# Patient Record
Sex: Female | Born: 1955
Health system: Southern US, Community
[De-identification: ages and names within clinical notes are randomized; demographics above are authoritative.]

## PROBLEM LIST (undated history)

## (undated) DIAGNOSIS — F32A Depression, unspecified: Secondary | ICD-10-CM

## (undated) DIAGNOSIS — I10 Essential (primary) hypertension: Secondary | ICD-10-CM

## (undated) DIAGNOSIS — T7840XA Allergy, unspecified, initial encounter: Secondary | ICD-10-CM

## (undated) DIAGNOSIS — H269 Unspecified cataract: Secondary | ICD-10-CM

## (undated) DIAGNOSIS — E785 Hyperlipidemia, unspecified: Secondary | ICD-10-CM

## (undated) DIAGNOSIS — I209 Angina pectoris, unspecified: Secondary | ICD-10-CM

## (undated) DIAGNOSIS — K219 Gastro-esophageal reflux disease without esophagitis: Secondary | ICD-10-CM

## (undated) DIAGNOSIS — J449 Chronic obstructive pulmonary disease, unspecified: Secondary | ICD-10-CM

## (undated) DIAGNOSIS — M1990 Unspecified osteoarthritis, unspecified site: Secondary | ICD-10-CM

## (undated) DIAGNOSIS — N950 Postmenopausal bleeding: Secondary | ICD-10-CM

## (undated) DIAGNOSIS — F419 Anxiety disorder, unspecified: Secondary | ICD-10-CM

## (undated) HISTORY — DX: Angina pectoris, unspecified: I20.9

## (undated) HISTORY — DX: Anxiety disorder, unspecified: F41.9

## (undated) HISTORY — DX: Hyperlipidemia, unspecified: E78.5

## (undated) HISTORY — DX: Essential (primary) hypertension: I10

## (undated) HISTORY — DX: Gastro-esophageal reflux disease without esophagitis: K21.9

## (undated) HISTORY — DX: Depression, unspecified: F32.A

## (undated) HISTORY — DX: Allergy, unspecified, initial encounter: T78.40XA

## (undated) HISTORY — DX: Postmenopausal bleeding: N95.0

## (undated) HISTORY — DX: Unspecified cataract: H26.9

## (undated) HISTORY — PX: WISDOM TOOTH EXTRACTION: SHX21

## (undated) HISTORY — PX: TUBAL LIGATION: SHX77

---

## 2006-06-05 ENCOUNTER — Emergency Department: Payer: Self-pay | Admitting: General Practice

## 2007-09-20 ENCOUNTER — Ambulatory Visit: Payer: Self-pay | Admitting: Obstetrics and Gynecology

## 2007-10-31 ENCOUNTER — Ambulatory Visit: Payer: Self-pay | Admitting: Obstetrics and Gynecology

## 2009-09-17 ENCOUNTER — Ambulatory Visit: Payer: Self-pay | Admitting: Obstetrics and Gynecology

## 2010-02-28 ENCOUNTER — Emergency Department: Payer: Self-pay | Admitting: Emergency Medicine

## 2011-08-03 ENCOUNTER — Ambulatory Visit: Payer: Self-pay | Admitting: Obstetrics and Gynecology

## 2011-08-05 ENCOUNTER — Ambulatory Visit: Payer: Self-pay | Admitting: Obstetrics and Gynecology

## 2012-02-07 ENCOUNTER — Ambulatory Visit: Payer: Self-pay | Admitting: Obstetrics and Gynecology

## 2012-02-22 ENCOUNTER — Ambulatory Visit: Payer: Self-pay | Admitting: Gastroenterology

## 2012-02-25 LAB — PATHOLOGY REPORT

## 2012-03-07 ENCOUNTER — Ambulatory Visit: Payer: Self-pay | Admitting: Gastroenterology

## 2012-05-07 IMAGING — MG MM MAMMO DIAGNOSTIC UNILATERAL*R*
1 series · 5 of 5 positions shown · non-contrast
Comparison: none

REASON FOR EXAM: FU RT NODULE
COMMENTS:

[R CC · right · 5 of 5 slices shown]
[im 1/5]
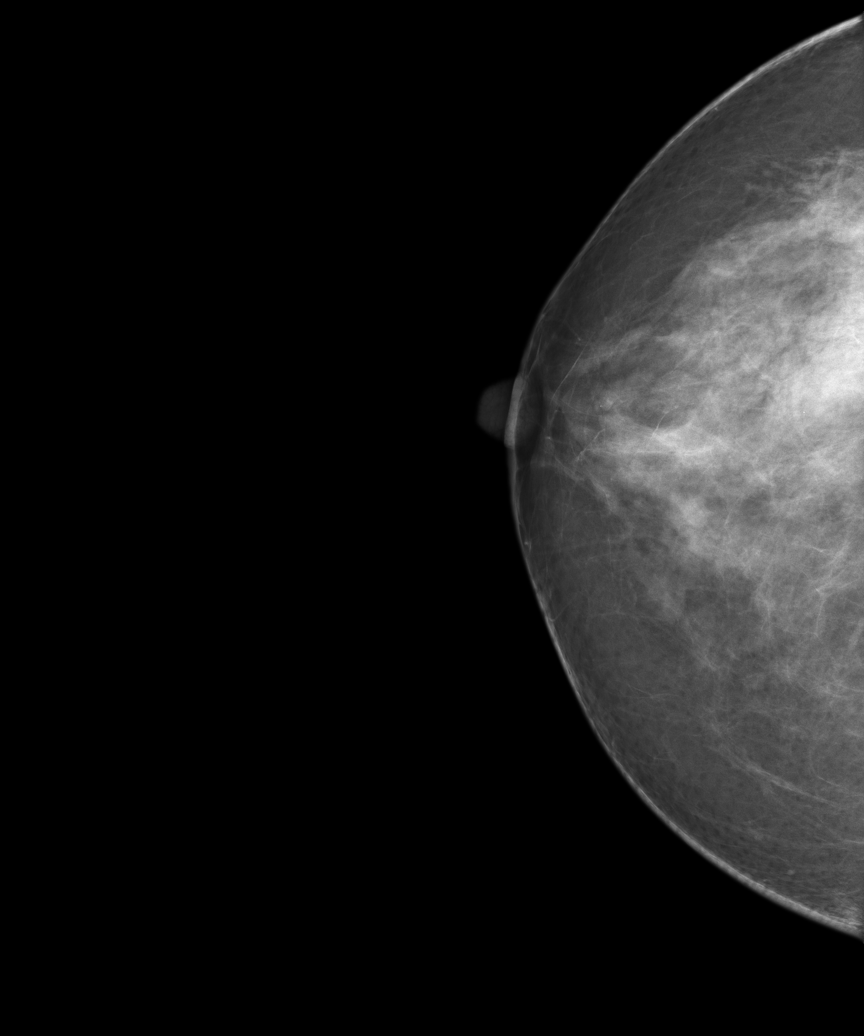
[im 2/5]
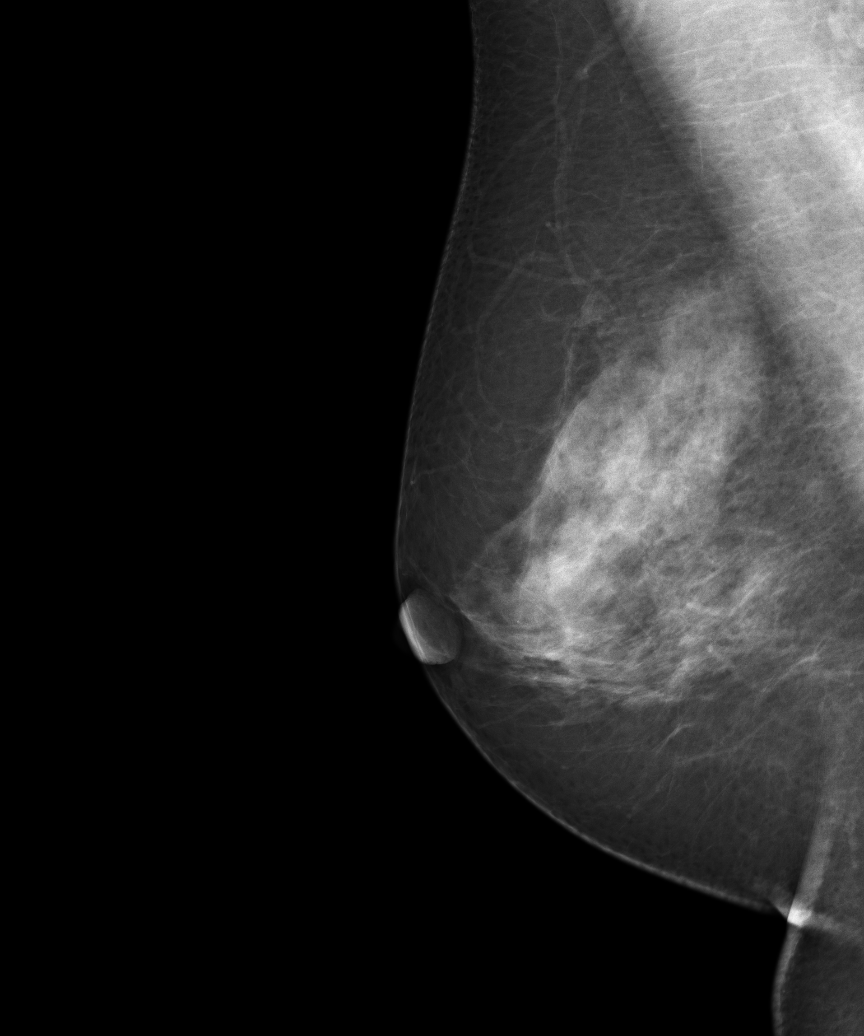
[im 3/5]
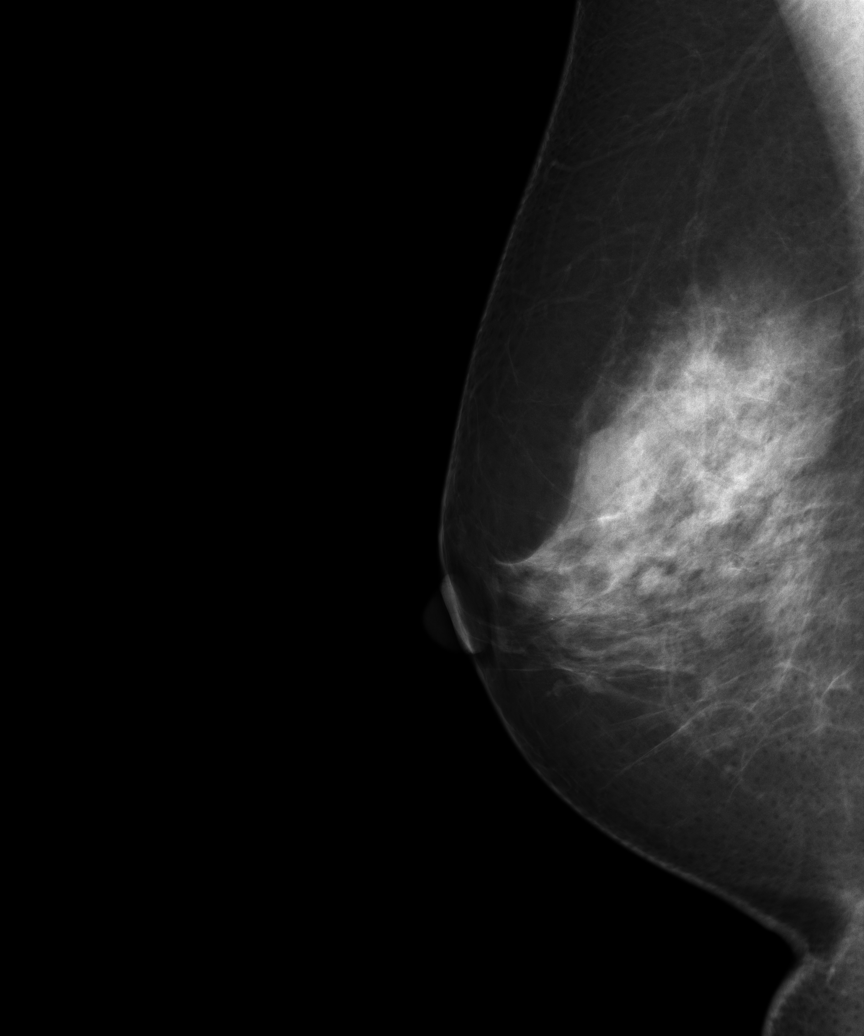
[im 4/5]
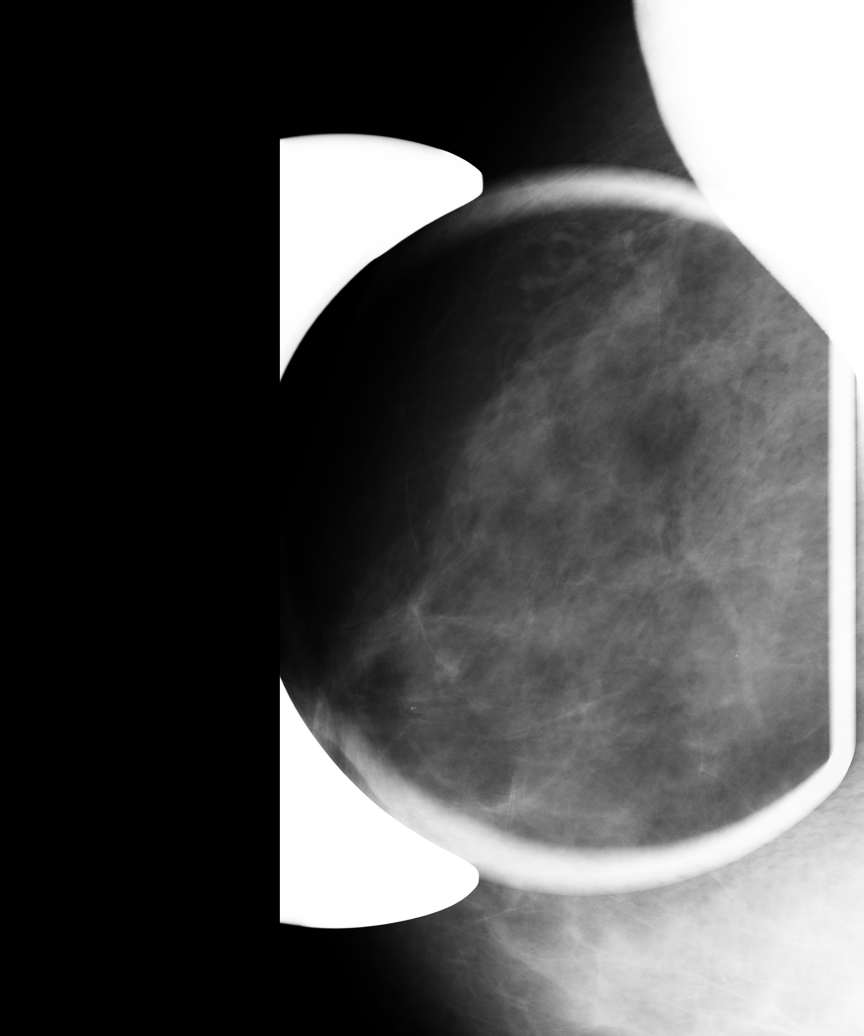
[im 5/5]
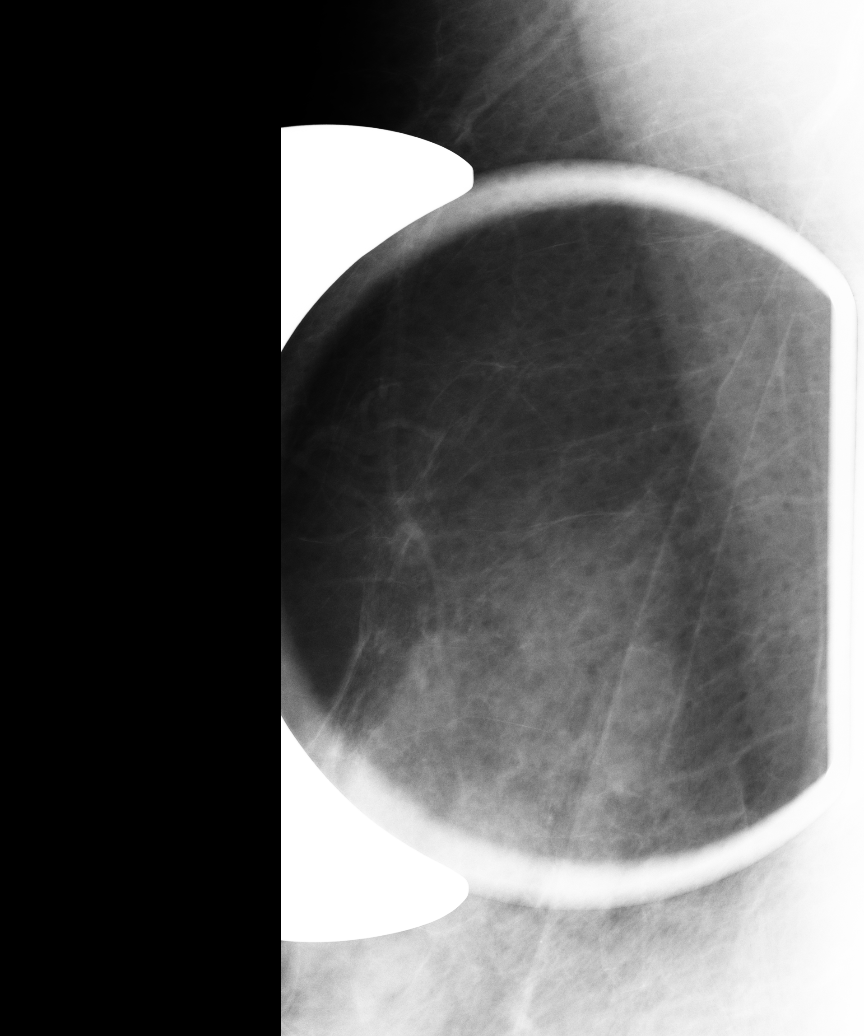

[5 of 5 positions shown; findings below may reference images not displayed]

PROCEDURE:     MAM - MAM DGTL UNI MAM RT BREAST W/CAD  - February 07, 2012 [DATE]

RESULT:     The patient underwent six-month followup mammography of the
right breast to reevaluate an area of nodularity in the upper outer
quadrant. Reference is made to previous study 05 August, 2011 as well as
to a bilateral screening study 03 August, 2011 and 31 October, 2007.

The right breast exhibits a moderately dense parenchymal pattern. I see no
dominant mass. There is parenchymal density superiorly. Allowing for
differences in positioning the findings are even less conspicuous today than
seen earlier. A supplemental spot magnification series as well as true
lateral films do not reveal suspicious findings.
IMPRESSION: I do not see findings suspicious for developing malignancy.

BI-RADS 2  benign findings

Recommendation: Please return to yearly mammographic followup of both
breasts i.e. in [REDACTED] or August 2012.

A negative mammographic or ultrasonic report should not preclude biopsy of
clinically palpable or otherwise suspicious lesions.

## 2012-08-23 ENCOUNTER — Ambulatory Visit: Payer: Self-pay | Admitting: Obstetrics and Gynecology

## 2013-10-01 ENCOUNTER — Ambulatory Visit: Payer: Self-pay | Admitting: Obstetrics and Gynecology

## 2014-08-05 DIAGNOSIS — F419 Anxiety disorder, unspecified: Secondary | ICD-10-CM | POA: Insufficient documentation

## 2014-08-05 DIAGNOSIS — E782 Mixed hyperlipidemia: Secondary | ICD-10-CM | POA: Insufficient documentation

## 2014-08-05 DIAGNOSIS — E785 Hyperlipidemia, unspecified: Secondary | ICD-10-CM | POA: Insufficient documentation

## 2014-08-05 DIAGNOSIS — I10 Essential (primary) hypertension: Secondary | ICD-10-CM | POA: Insufficient documentation

## 2014-12-10 ENCOUNTER — Ambulatory Visit: Payer: Self-pay | Admitting: Obstetrics and Gynecology

## 2014-12-10 LAB — HM DEXA SCAN

## 2016-02-05 ENCOUNTER — Other Ambulatory Visit: Payer: Self-pay | Admitting: Obstetrics and Gynecology

## 2016-02-05 DIAGNOSIS — Z1231 Encounter for screening mammogram for malignant neoplasm of breast: Secondary | ICD-10-CM

## 2016-02-17 ENCOUNTER — Ambulatory Visit: Payer: Self-pay

## 2017-06-24 ENCOUNTER — Ambulatory Visit: Admission: RE | Admit: 2017-06-24 | Payer: Self-pay | Source: Ambulatory Visit | Admitting: Gastroenterology

## 2017-06-24 ENCOUNTER — Encounter: Admission: RE | Payer: Self-pay | Source: Ambulatory Visit

## 2017-06-24 SURGERY — COLONOSCOPY WITH PROPOFOL
Anesthesia: General

## 2017-11-11 ENCOUNTER — Ambulatory Visit: Payer: BLUE CROSS/BLUE SHIELD | Admitting: Unknown Physician Specialty

## 2017-11-11 ENCOUNTER — Encounter: Payer: Self-pay | Admitting: Unknown Physician Specialty

## 2017-11-11 VITALS — BP 113/61 | HR 65 | Temp 98.5°F | Ht 63.3 in | Wt 145.4 lb

## 2017-11-11 DIAGNOSIS — B029 Zoster without complications: Secondary | ICD-10-CM | POA: Diagnosis not present

## 2017-11-11 DIAGNOSIS — E7800 Pure hypercholesterolemia, unspecified: Secondary | ICD-10-CM | POA: Diagnosis not present

## 2017-11-11 DIAGNOSIS — N950 Postmenopausal bleeding: Secondary | ICD-10-CM

## 2017-11-11 DIAGNOSIS — R5383 Other fatigue: Secondary | ICD-10-CM

## 2017-11-11 DIAGNOSIS — F419 Anxiety disorder, unspecified: Secondary | ICD-10-CM | POA: Diagnosis not present

## 2017-11-11 DIAGNOSIS — Z7689 Persons encountering health services in other specified circumstances: Secondary | ICD-10-CM

## 2017-11-11 DIAGNOSIS — I10 Essential (primary) hypertension: Secondary | ICD-10-CM | POA: Diagnosis not present

## 2017-11-11 NOTE — Progress Notes (Addendum)
BP 113/61   Pulse 65   Temp 98.5 F (36.9 C) (Oral)   Ht 5' 3.3" (1.608 m)   Wt 145 lb 6.4 oz (66 kg)   LMP  (LMP Unknown)   SpO2 93%   BMI 25.51 kg/m    Subjective:    Patient ID: Valerie Donovan, female    DOB: 1956/04/01, 61 y.o.   MRN: [id number]  HPI: Valerie Donovan is a 61 y.o. female  Chief Complaint  Patient presents with  . Establish Care   Pt is a new patient.  She has been receiving regular medical care for the following problems  Hypertension Using medications without difficulty Average home BPs "running normal"  No problems or lightheadedness No chest pain with exertion or shortness of breath No Edema   Hyperlipidemia Using medications without problems: No Muscle aches  Diet compliance:Exercise: Very active.  Works part-time as a Arts development officer Prozac and Buspar  Depression screen [facility] 2/9 11/11/2017  Decreased Interest 3  Down, Depressed, Hopeless 0  PHQ - 2 Score 3  Altered sleeping 0  Tired, decreased energy 3  Change in appetite 0  Feeling bad or failure about yourself  0  Trouble concentrating 0  Moving slowly or fidgety/restless 0  Suicidal thoughts 0  PHQ-9 Score 6   Vaginal bleeding Pt had an episode of vaginal bleeding 1 week ago.  Last colonoscopy was 5 years ago  Shingles Broke out 2 weeks ago left flank.  This seems to be resolving.  She has not been vaccinated and she is nervous about taking it.  She is complaing of pain left flank and clothes touching is painful  Fatigue X 2 weeks has "felt like hell."  Admits to being under a lot of stress  Labs done on 02/25/2017 reviewed and were normal  Relevant past medical, surgical, family and social history reviewed and updated as indicated. Interim medical history since our last visit reviewed. Allergies and medications reviewed and updated.  Review of Systems  Per HPI unless specifically indicated above     Objective:    BP 113/61   Pulse 65   Temp 98.5 F  (36.9 C) (Oral)   Ht 5' 3.3" (1.608 m)   Wt 145 lb 6.4 oz (66 kg)   LMP  (LMP Unknown)   SpO2 93%   BMI 25.51 kg/m   Wt Readings from Last 3 Encounters:  11/11/17 145 lb 6.4 oz (66 kg)    Physical Exam  Constitutional: She is oriented to person, place, and time. She appears well-developed and well-nourished. No distress.  HENT:  Head: Normocephalic and atraumatic.  Eyes: Conjunctivae and lids are normal. Right eye exhibits no discharge. Left eye exhibits no discharge. No scleral icterus.  Neck: Normal range of motion. Neck supple. No JVD present. Carotid bruit is not present.  Cardiovascular: Normal rate, regular rhythm and normal heart sounds.  Pulmonary/Chest: Effort normal and breath sounds normal.  Abdominal: Normal appearance. There is no splenomegaly or hepatomegaly.  Musculoskeletal: Normal range of motion.  Neurological: She is alert and oriented to person, place, and time.  Skin: Skin is warm, dry and intact. No rash noted. No pallor.  Left flank healing vesicles in dermatomal distribution  Psychiatric: She has a normal mood and affect. Her behavior is normal. Judgment and thought content normal.    No results found for this or any previous visit.    Assessment & Plan:   Problem List Items Addressed This Visit  Unprioritized   Anxiety    Stable, continue present medications.        Relevant Medications   busPIRone (BUSPAR) 15 MG tablet   FLUoxetine (PROZAC) 20 MG capsule   Hyperlipidemia, unspecified    Stable, continue present medications.        Relevant Medications   atorvastatin (LIPITOR) 20 MG tablet   lisinopril (PRINIVIL,ZESTRIL) 10 MG tablet   Other Relevant Orders   Lipid Panel w/o Chol/HDL Ratio   Hypertension    Stable, continue present medications.        Relevant Medications   atorvastatin (LIPITOR) 20 MG tablet   lisinopril (PRINIVIL,ZESTRIL) 10 MG tablet   Other Relevant Orders   Comprehensive metabolic panel    Other Visit  Diagnoses    Herpes zoster without complication    -  Primary   This seems to be resolving on it's own.  Discussed Lidoderm OTC patches   Fatigue, unspecified type       This is new.  Check labs   Relevant Orders   CBC with Differential/Platelet   Comprehensive metabolic panel   TSH   VITAMIN D 25 Hydroxy (Vit-D Deficiency, Fractures)   Vitamin B12   Post-menopausal bleeding       Appt already made with gyn.  Will do labs to help start the diagnostic proceed   Relevant Orders   [place]   Estrogens, Total   LH   Encounter to establish care       Relevant Orders   HIV antibody   Hepatitis C antibody   MM DIGITAL SCREENING BILATERAL       Follow up plan: Return in about 2 weeks (around 11/25/2017).

## 2017-11-11 NOTE — Assessment & Plan Note (Signed)
Stable, continue present medications.   

## 2017-11-11 NOTE — Patient Instructions (Signed)
Please do call to schedule your mammogram; the number to schedule one at either [facility] or [facility] is [phone]   

## 2017-11-13 LAB — COMPREHENSIVE METABOLIC PANEL
A/G RATIO: 1.9 (ref 1.2–2.2)
ALT: 15 IU/L (ref 0–32)
AST: 14 IU/L (ref 0–40)
Albumin: 4.6 g/dL (ref 3.6–4.8)
Alkaline Phosphatase: 114 IU/L (ref 39–117)
BILIRUBIN TOTAL: 0.2 mg/dL (ref 0.0–1.2)
BUN/Creatinine Ratio: 13 (ref 12–28)
BUN: 10 mg/dL (ref 8–27)
CHLORIDE: 104 mmol/L (ref 96–106)
CO2: 21 mmol/L (ref 20–29)
Calcium: 9.6 mg/dL (ref 8.7–10.3)
Creatinine, Ser: 0.8 mg/dL (ref 0.57–1.00)
GFR calc non Af Amer: 79 mL/min/{1.73_m2} (ref >59)
GFR, EST AFRICAN AMERICAN: 91 mL/min/{1.73_m2} (ref >59)
Globulin, Total: 2.4 g/dL (ref 1.5–4.5)
Glucose: 93 mg/dL (ref 65–99)
POTASSIUM: 4.3 mmol/L (ref 3.5–5.2)
Sodium: 142 mmol/L (ref 134–144)
TOTAL PROTEIN: 7 g/dL (ref 6.0–8.5)

## 2017-11-13 LAB — FOLLICLE STIMULATING HORMONE: FSH: 97.8 m[IU]/mL

## 2017-11-13 LAB — CBC WITH DIFFERENTIAL/PLATELET
BASOS: 0 %
Basophils Absolute: 0 10*3/uL (ref 0.0–0.2)
EOS (ABSOLUTE): 0.1 10*3/uL (ref 0.0–0.4)
Eos: 1 %
Hematocrit: 41.1 % (ref 34.0–46.6)
Hemoglobin: 13.9 g/dL (ref 11.1–15.9)
Immature Grans (Abs): 0 10*3/uL (ref 0.0–0.1)
Immature Granulocytes: 0 %
LYMPHS ABS: 2.5 10*3/uL (ref 0.7–3.1)
Lymphs: 26 %
MCH: 31.4 pg (ref 26.6–33.0)
MCHC: 33.8 g/dL (ref 31.5–35.7)
MCV: 93 fL (ref 79–97)
MONOS ABS: 0.6 10*3/uL (ref 0.1–0.9)
Monocytes: 6 %
NEUTROS ABS: 6.5 10*3/uL (ref 1.4–7.0)
Neutrophils: 67 %
PLATELETS: 331 10*3/uL (ref 150–379)
RBC: 4.42 x10E6/uL (ref 3.77–5.28)
RDW: 13.1 % (ref 12.3–15.4)
WBC: 9.8 10*3/uL (ref 3.4–10.8)

## 2017-11-13 LAB — LIPID PANEL W/O CHOL/HDL RATIO
Cholesterol, Total: 138 mg/dL (ref 100–199)
HDL: 41 mg/dL (ref >39)
LDL Calculated: 77 mg/dL (ref 0–99)
Triglycerides: 102 mg/dL (ref 0–149)
VLDL Cholesterol Cal: 20 mg/dL (ref 5–40)

## 2017-11-13 LAB — ESTROGENS, TOTAL: ESTROGEN: 92 pg/mL

## 2017-11-13 LAB — HEPATITIS C ANTIBODY: Hep C Virus Ab: <0.1 s/co ratio (ref 0.0–0.9)

## 2017-11-13 LAB — VITAMIN D 25 HYDROXY (VIT D DEFICIENCY, FRACTURES): VIT D 25 HYDROXY: 18.7 ng/mL — AB (ref 30.0–100.0)

## 2017-11-13 LAB — VITAMIN B12: VITAMIN B 12: 229 pg/mL — AB (ref 232–1245)

## 2017-11-13 LAB — HIV ANTIBODY (ROUTINE TESTING W REFLEX): HIV Screen 4th Generation wRfx: NONREACTIVE

## 2017-11-13 LAB — LUTEINIZING HORMONE: LH: 50.6 m[IU]/mL

## 2017-11-13 LAB — TSH: TSH: 2.67 u[IU]/mL (ref 0.450–4.500)

## 2017-11-30 ENCOUNTER — Ambulatory Visit: Payer: BLUE CROSS/BLUE SHIELD | Admitting: Unknown Physician Specialty

## 2017-12-19 ENCOUNTER — Ambulatory Visit: Payer: BLUE CROSS/BLUE SHIELD | Admitting: Unknown Physician Specialty

## 2017-12-21 ENCOUNTER — Encounter: Payer: Self-pay | Admitting: Unknown Physician Specialty

## 2017-12-21 ENCOUNTER — Ambulatory Visit (INDEPENDENT_AMBULATORY_CARE_PROVIDER_SITE_OTHER): Payer: BLUE CROSS/BLUE SHIELD | Admitting: Unknown Physician Specialty

## 2017-12-21 VITALS — BP 132/74 | HR 55 | Temp 98.2°F | Wt 145.2 lb

## 2017-12-21 DIAGNOSIS — R5383 Other fatigue: Secondary | ICD-10-CM

## 2017-12-21 DIAGNOSIS — E559 Vitamin D deficiency, unspecified: Secondary | ICD-10-CM

## 2017-12-21 DIAGNOSIS — E538 Deficiency of other specified B group vitamins: Secondary | ICD-10-CM | POA: Diagnosis not present

## 2017-12-21 NOTE — Assessment & Plan Note (Signed)
Taking Vitamin D supplement 

## 2017-12-21 NOTE — Progress Notes (Signed)
BP 132/74   Pulse (!) 55   Temp 98.2 F (36.8 C) (Oral)   Wt 145 lb 3.2 oz (65.9 kg)   SpO2 96%   BMI 25.48 kg/m    Subjective:    Patient ID: Valerie Donovan, female    DOB: 1956-08-13, 62 y.o.   MRN: [id number]  HPI: Valerie Donovan is a 62 y.o. female  Chief Complaint  Patient presents with  . Follow-up   Post menopausal bleeding Pt with history of post-menopausal bleeding.  She is seeing Dr. Leafy Ro at [facility]. She is due to have a biopsy and saline infused [place] later this month.    Fatigue She is taking Vitamin B12 and Vit D after her last lab work.  States she is doing better with her fatigue.    Relevant past medical, surgical, family and social history reviewed and updated as indicated. Interim medical history since our last visit reviewed. Allergies and medications reviewed and updated.  Review of Systems  Per HPI unless specifically indicated above     Objective:    BP 132/74   Pulse (!) 55   Temp 98.2 F (36.8 C) (Oral)   Wt 145 lb 3.2 oz (65.9 kg)   SpO2 96%   BMI 25.48 kg/m   Wt Readings from Last 3 Encounters:  12/21/17 145 lb 3.2 oz (65.9 kg)  11/11/17 145 lb 6.4 oz (66 kg)    Physical Exam  Constitutional: She is oriented to person, place, and time. She appears well-developed and well-nourished. No distress.  HENT:  Head: Normocephalic and atraumatic.  Eyes: Conjunctivae and lids are normal. Right eye exhibits no discharge. Left eye exhibits no discharge. No scleral icterus.  Neck: Normal range of motion. Neck supple. No JVD present. Carotid bruit is not present.  Cardiovascular: Normal rate, regular rhythm and normal heart sounds.  Pulmonary/Chest: Effort normal and breath sounds normal.  Abdominal: Normal appearance. There is no splenomegaly or hepatomegaly.  Musculoskeletal: Normal range of motion.  Neurological: She is alert and oriented to person, place, and time.  Skin: Skin is warm, dry and intact. No rash noted. No pallor.   Psychiatric: She has a normal mood and affect. Her behavior is normal. Judgment and thought content normal.    Results for orders placed or performed in visit on 11/11/17  CBC with Differential/Platelet  Result Value Ref Range   WBC 9.8 3.4 - 10.8 x10E3/uL   RBC 4.42 3.77 - 5.28 x10E6/uL   Hemoglobin 13.9 11.1 - 15.9 g/dL   Hematocrit 41.1 34.0 - 46.6 %   MCV 93 79 - 97 fL   MCH 31.4 26.6 - 33.0 pg   MCHC 33.8 31.5 - 35.7 g/dL   RDW 13.1 12.3 - 15.4 %   Platelets 331 150 - 379 x10E3/uL   Neutrophils 67 Not Estab. %   Lymphs 26 Not Estab. %   Monocytes 6 Not Estab. %   Eos 1 Not Estab. %   Basos 0 Not Estab. %   Neutrophils Absolute 6.5 1.4 - 7.0 x10E3/uL   Lymphocytes Absolute 2.5 0.7 - 3.1 x10E3/uL   Monocytes Absolute 0.6 0.1 - 0.9 x10E3/uL   EOS (ABSOLUTE) 0.1 0.0 - 0.4 x10E3/uL   Basophils Absolute 0.0 0.0 - 0.2 x10E3/uL   Immature Granulocytes 0 Not Estab. %   Immature Grans (Abs) 0.0 0.0 - 0.1 x10E3/uL  Comprehensive metabolic panel  Result Value Ref Range   Glucose 93 65 - 99 mg/dL   BUN 10  8 - 27 mg/dL   Creatinine, Ser 0.80 0.57 - 1.00 mg/dL   GFR calc non Af Amer 79 >59 mL/min/1.73   GFR calc Af Amer 91 >59 mL/min/1.73   BUN/Creatinine Ratio 13 12 - 28   Sodium 142 134 - 144 mmol/L   Potassium 4.3 3.5 - 5.2 mmol/L   Chloride 104 96 - 106 mmol/L   CO2 21 20 - 29 mmol/L   Calcium 9.6 8.7 - 10.3 mg/dL   Total Protein 7.0 6.0 - 8.5 g/dL   Albumin 4.6 3.6 - 4.8 g/dL   Globulin, Total 2.4 1.5 - 4.5 g/dL   Albumin/Globulin Ratio 1.9 1.2 - 2.2   Bilirubin Total 0.2 0.0 - 1.2 mg/dL   Alkaline Phosphatase 114 39 - 117 IU/L   AST 14 0 - 40 IU/L   ALT 15 0 - 32 IU/L  Lipid Panel w/o Chol/HDL Ratio  Result Value Ref Range   Cholesterol, Total 138 100 - 199 mg/dL   Triglycerides 102 0 - 149 mg/dL   HDL 41 >39 mg/dL   VLDL Cholesterol Cal 20 5 - 40 mg/dL   LDL Calculated 77 0 - 99 mg/dL  TSH  Result Value Ref Range   TSH 2.670 0.450 - 4.500 uIU/mL  FSH  Result  Value Ref Range   FSH 97.8 mIU/mL  Estrogens, Total  Result Value Ref Range   Estrogen 92 pg/mL  LH  Result Value Ref Range   LH 50.6 mIU/mL  VITAMIN D 25 Hydroxy (Vit-D Deficiency, Fractures)  Result Value Ref Range   Vit D, 25-Hydroxy 18.7 (L) 30.0 - 100.0 ng/mL  Vitamin B12  Result Value Ref Range   Vitamin B-12 229 (L) 232 - 1,245 pg/mL  HIV antibody  Result Value Ref Range   HIV Screen 4th Generation wRfx Non Reactive Non Reactive  Hepatitis C antibody  Result Value Ref Range   Hep C Virus Ab <0.1 0.0 - 0.9 s/co ratio      Assessment & Plan:   Problem List Items Addressed This Visit      Unprioritized   Vitamin B12 deficiency    Doing well with oral supplements      Vitamin D deficiency    Taking Vitamin D supplement       Other Visit Diagnoses    Fatigue, unspecified type    -  Primary   Improved with Vitamin B12 and Vitamin D deficiency       Follow up plan: Return in about 6 months (around 06/20/2018).

## 2017-12-21 NOTE — Assessment & Plan Note (Signed)
Doing well with oral supplements

## 2017-12-21 NOTE — Patient Instructions (Signed)
Please do call to schedule your mammogram; the number to schedule one at either [facility] or [facility] is [phone]   

## 2018-01-25 ENCOUNTER — Ambulatory Visit
Admission: RE | Admit: 2018-01-25 | Discharge: 2018-01-25 | Disposition: A | Discharge status: Home / Self Care | Payer: BLUE CROSS/BLUE SHIELD | Source: Ambulatory Visit | Attending: Unknown Physician Specialty | Admitting: Unknown Physician Specialty

## 2018-01-25 ENCOUNTER — Encounter: Payer: Self-pay | Admitting: Radiology

## 2018-01-25 DIAGNOSIS — Z7689 Persons encountering health services in other specified circumstances: Secondary | ICD-10-CM

## 2018-01-25 DIAGNOSIS — Z1231 Encounter for screening mammogram for malignant neoplasm of breast: Secondary | ICD-10-CM | POA: Insufficient documentation

## 2018-03-07 ENCOUNTER — Ambulatory Visit: Payer: Self-pay | Admitting: *Deleted

## 2018-03-07 NOTE — Telephone Encounter (Signed)
Routing to provider who the patient is scheduled to see tomorrow, just an FYI as to why patient is coming in.

## 2018-03-07 NOTE — Telephone Encounter (Signed)
Patient states when she went to the bathroom this morning she went to scratch her ear and noticed that it was crusty & looked like coffee grinds in her ear. She said she put a qtip in there to clean it and noticed it was bleeding just a small amount. I spoke with [facility] to see what they thought because she wanted to know if she needed to be seen & leslie said that would be a good idea. I called the office because they are full for today & they told me to schedule something for her tomorrow. I scheduled her an appointment with Apolonio Schneiders at 8:15 tomorrow morning. She wants to know what kind of home care she can do to treat it until she can be seen tomorrow. She states she is fine & no pain. She said if it started to pour blood she would call us back.      Call to patient- noticed crusty dried blood in L ear this morning- when she cleaned the outer ear area- she noticed bright red blood- she use about 4 Q-tips on the outer ear area- not sticking them all the way into the ear canal. Ear is not sore and she has not had any injury at all. Sunday she was eating bit-of-honey which made her jaw sore on the R. She did not have any blood on her pillow- she does sleep with her 2 dogs. Patient has appointment in the morning and will call if she has any changes.  Reason for Disposition . Unexplained bleeding from ear  Answer Assessment - Initial Assessment Questions 1. LOCATION: "Which ear is involved?"      Left ear 2. COLOR: "What is the color of the discharge?"      Dark red- to bright red- no drainage out of ear 3. CONSISTENCY: "How runny is the discharge? Could it be water?"      Dry at first- crusty- then wet and red at outer ear 4. ONSET: "When did you first notice the discharge?"     This morning 5. PAIN: "Is there any earache?" "How bad is it?"  (Scale 1-10; or mild, moderate, severe)     No pain 6. OBJECTS: "Any use of q-tips or have you inserted anything else in your ear?"     Nothing in inner  ear- patient does sleep with dogs 7. OTHER SYMPTOMS: "Do you have any other symptoms?" (e.g., headache, fever, dizziness, vomiting, runny nose)     no 8. PREGNANCY: "Is there any chance you are pregnant?" "When was your last menstrual period?"     n/a  Protocols used: EAR - DISCHARGE-A-AH

## 2018-03-08 ENCOUNTER — Encounter: Payer: Self-pay | Admitting: Family Medicine

## 2018-03-08 ENCOUNTER — Ambulatory Visit: Payer: BLUE CROSS/BLUE SHIELD | Admitting: Family Medicine

## 2018-03-08 VITALS — BP 119/65 | HR 61 | Temp 98.6°F | Wt 145.2 lb

## 2018-03-08 DIAGNOSIS — H60502 Unspecified acute noninfective otitis externa, left ear: Secondary | ICD-10-CM

## 2018-03-08 MED ORDER — FLUOCINOLONE ACETONIDE 0.01 % OT OIL: 2.0000 [drp] | TOPICAL_OIL | Freq: Two times a day (BID) | OTIC | 0 refills | Status: DC | PRN

## 2018-03-08 NOTE — Progress Notes (Signed)
BP 119/65 (BP Location: Right Arm, Patient Position: Sitting, Cuff Size: Normal)   Pulse 61   Temp 98.6 F (37 C) (Oral)   Wt 145 lb 3.2 oz (65.9 kg)   LMP  (LMP Unknown)   SpO2 98%   BMI 25.48 kg/m    Subjective:    Patient ID: Valerie Donovan, female    DOB: [birth year], 62 y.o.   MRN: [id number]  HPI: Valerie Donovan is a 62 y.o. female  Chief Complaint  Patient presents with  . Ear Drainage    Patient stated she woke up yesterday morning and felt some dry on the outer part of ear. Used QTip (on outside) and saw blood. No ear pain.   Pt here today for evaluation of bloody drainage from left ear that started yesterday. No associated pain, sick sxs, fevers, hearing changes, or itching. Uses q-tips but states she does not use them harshly where she would have cut anything. No hx of ear problems. Has not been trying anything OTC at this time.   Relevant past medical, surgical, family and social history reviewed and updated as indicated. Interim medical history since our last visit reviewed. Allergies and medications reviewed and updated.  Review of Systems  Per HPI unless specifically indicated above     Objective:    BP 119/65 (BP Location: Right Arm, Patient Position: Sitting, Cuff Size: Normal)   Pulse 61   Temp 98.6 F (37 C) (Oral)   Wt 145 lb 3.2 oz (65.9 kg)   LMP  (LMP Unknown)   SpO2 98%   BMI 25.48 kg/m   Wt Readings from Last 3 Encounters:  03/09/18 145 lb 4.8 oz (65.9 kg)  03/08/18 145 lb 3.2 oz (65.9 kg)  12/21/17 145 lb 3.2 oz (65.9 kg)    Physical Exam  Constitutional: She is oriented to person, place, and time. She appears well-developed and well-nourished. No distress.  HENT:  Head: Atraumatic.  Right Ear: External ear normal.  Nose: Nose normal.  Mouth/Throat: Oropharynx is clear and moist. No oropharyngeal exudate.  Left EAC erythematous with some dried blood present. No purulent drainage present, no tragus ttp, auricle redness B/l TMs  intact and benign  Eyes: Pupils are equal, round, and reactive to light. Conjunctivae are normal.  Neck: Normal range of motion. Neck supple.  Cardiovascular: Normal rate and normal heart sounds.  Pulmonary/Chest: Effort normal. No respiratory distress.  Musculoskeletal: Normal range of motion.  Neurological: She is alert and oriented to person, place, and time.  Skin: Skin is warm and dry. No rash noted.  Psychiatric: She has a normal mood and affect. Her behavior is normal.  Nursing note and vitals reviewed.   Results for orders placed or performed in visit on 11/11/17  CBC with Differential/Platelet  Result Value Ref Range   WBC 9.8 3.4 - 10.8 x10E3/uL   RBC 4.42 3.77 - 5.28 x10E6/uL   Hemoglobin 13.9 11.1 - 15.9 g/dL   Hematocrit 41.1 34.0 - 46.6 %   MCV 93 79 - 97 fL   MCH 31.4 26.6 - 33.0 pg   MCHC 33.8 31.5 - 35.7 g/dL   RDW 13.1 12.3 - 15.4 %   Platelets 331 150 - 379 x10E3/uL   Neutrophils 67 Not Estab. %   Lymphs 26 Not Estab. %   Monocytes 6 Not Estab. %   Eos 1 Not Estab. %   Basos 0 Not Estab. %   Neutrophils Absolute 6.5 1.4 - 7.0 x10E3/uL  Lymphocytes Absolute 2.5 0.7 - 3.1 x10E3/uL   Monocytes Absolute 0.6 0.1 - 0.9 x10E3/uL   EOS (ABSOLUTE) 0.1 0.0 - 0.4 x10E3/uL   Basophils Absolute 0.0 0.0 - 0.2 x10E3/uL   Immature Granulocytes 0 Not Estab. %   Immature Grans (Abs) 0.0 0.0 - 0.1 x10E3/uL  Comprehensive metabolic panel  Result Value Ref Range   Glucose 93 65 - 99 mg/dL   BUN 10 8 - 27 mg/dL   Creatinine, Ser 0.80 0.57 - 1.00 mg/dL   GFR calc non Af Amer 79 >59 mL/min/1.73   GFR calc Af Amer 91 >59 mL/min/1.73   BUN/Creatinine Ratio 13 12 - 28   Sodium 142 134 - 144 mmol/L   Potassium 4.3 3.5 - 5.2 mmol/L   Chloride 104 96 - 106 mmol/L   CO2 21 20 - 29 mmol/L   Calcium 9.6 8.7 - 10.3 mg/dL   Total Protein 7.0 6.0 - 8.5 g/dL   Albumin 4.6 3.6 - 4.8 g/dL   Globulin, Total 2.4 1.5 - 4.5 g/dL   Albumin/Globulin Ratio 1.9 1.2 - 2.2   Bilirubin Total  0.2 0.0 - 1.2 mg/dL   Alkaline Phosphatase 114 39 - 117 IU/L   AST 14 0 - 40 IU/L   ALT 15 0 - 32 IU/L  Lipid Panel w/o Chol/HDL Ratio  Result Value Ref Range   Cholesterol, Total 138 100 - 199 mg/dL   Triglycerides 102 0 - 149 mg/dL   HDL 41 >39 mg/dL   VLDL Cholesterol Cal 20 5 - 40 mg/dL   LDL Calculated 77 0 - 99 mg/dL  TSH  Result Value Ref Range   TSH 2.670 0.450 - 4.500 uIU/mL  FSH  Result Value Ref Range   FSH 97.8 mIU/mL  Estrogens, Total  Result Value Ref Range   Estrogen 92 pg/mL  LH  Result Value Ref Range   LH 50.6 mIU/mL  VITAMIN D 25 Hydroxy (Vit-D Deficiency, Fractures)  Result Value Ref Range   Vit D, 25-Hydroxy 18.7 (L) 30.0 - 100.0 ng/mL  Vitamin B12  Result Value Ref Range   Vitamin B-12 229 (L) 232 - 1,245 pg/mL  HIV antibody  Result Value Ref Range   HIV Screen 4th Generation wRfx Non Reactive Non Reactive  Hepatitis C antibody  Result Value Ref Range   Hep C Virus Ab <0.1 0.0 - 0.9 s/co ratio      Assessment & Plan:   Problem List Items Addressed This Visit    None    Visit Diagnoses    Acute noninfective otitis externa of left ear, unspecified type    -  Primary   Suspect irritation or eczema, will tx with prn steroid oil, avoid using hairspray unless using cotton balls in EACs. F/u if worsening or no improvement       Follow up plan: Return if symptoms worsen or fail to improve.

## 2018-03-09 ENCOUNTER — Ambulatory Visit: Payer: Self-pay | Admitting: *Deleted

## 2018-03-09 ENCOUNTER — Encounter: Payer: Self-pay | Admitting: Family Medicine

## 2018-03-09 ENCOUNTER — Ambulatory Visit: Payer: BLUE CROSS/BLUE SHIELD | Admitting: Family Medicine

## 2018-03-09 VITALS — BP 135/82 | HR 68 | Temp 98.8°F | Ht 63.3 in | Wt 145.3 lb

## 2018-03-09 DIAGNOSIS — R590 Localized enlarged lymph nodes: Secondary | ICD-10-CM

## 2018-03-09 DIAGNOSIS — H60502 Unspecified acute noninfective otitis externa, left ear: Secondary | ICD-10-CM

## 2018-03-09 MED ORDER — AZITHROMYCIN 250 MG PO TABS: ORAL_TABLET | ORAL | 0 refills | Status: DC

## 2018-03-09 NOTE — Telephone Encounter (Signed)
I returned her call.  She saw Valerie Donovan yesterday for bleeding in her left ear.   She also mentioned the swelling in the gland under her right jaw yesterday to [facility].    She called back this morning c/o pain in her jaw and swelling.   She took Ibuprofen.     There has already been a note to [employer] this morning making her aware of this problem.   I let the pt know we are waiting for her response.  I routed this note to the office so they would be aware I spoke with the pt this morning. Reason for Disposition . Ear pain    Gland under her right jaw is swollen and painful.   Denies pain in the ear or discharge.   [name] has been sent a note regarding this earlier this morning by her CMA in the office.   I let pt know we are waiting for her response.  Answer Assessment - Initial Assessment Questions 1. LOCATION: "Which ear is involved?"      RT gland under right jawbone is swollen.   I saw Valerie Donovan yesterday for bleeding from my left ear.   I mentioned it to her but she didn't do anything at the time.    2. COLOR: "What is the color of the discharge?"      No discharge 3. CONSISTENCY: "How runny is the discharge? Could it be water?"      None     No longer bleeding from ear on the left side.   I'm using the steroid  Apolonio Schneiders prescribed for me yesterday.    I can call my dentist.     4. ONSET: "When did you first notice the discharge?"     *No Answer* 5. PAIN: "Is there any earache?" "How bad is it?"  (Scale 1-10; or mild, moderate, severe)     *No Answer* 6. OBJECTS: "Any use of q-tips or have you inserted anything else in your ear?"     *No Answer* 7. OTHER SYMPTOMS: "Do you have any other symptoms?" (e.g., headache, fever, dizziness, vomiting, runny nose)     The teeth along my jaw line hurt on the right side too. 8. PREGNANCY: "Is there any chance you are pregnant?" "When was your last menstrual period?"     Not asked  Protocols used: EAR - DISCHARGE-A-AH

## 2018-03-09 NOTE — Progress Notes (Signed)
BP 135/82   Pulse 68   Temp 98.8 F (37.1 C) (Oral)   Ht 5' 3.3" (1.608 m)   Wt 145 lb 4.8 oz (65.9 kg)   LMP  (LMP Unknown)   SpO2 97%   BMI 25.50 kg/m    Subjective:    Patient ID: Valerie Donovan, female    DOB: 1956/05/21, 62 y.o.   MRN: [id number]  HPI: Valerie Donovan is a 62 y.o. female  Chief Complaint  Patient presents with  . Edema    pt states she has had lymph node swelling on the right side of her face and teeth soreness as well   Pt here for follow up from yesterday's OV for left ear bloody discharge. Now having painful adenopathy on right side of neck. No fevers, congestion, ear pain, sore throat, cough, CP, SOB. Has been trying the steroid oil solution for left with relief.   Relevant past medical, surgical, family and social history reviewed and updated as indicated. Interim medical history since our last visit reviewed. Allergies and medications reviewed and updated.  Review of Systems  Per HPI unless specifically indicated above     Objective:    BP 135/82   Pulse 68   Temp 98.8 F (37.1 C) (Oral)   Ht 5' 3.3" (1.608 m)   Wt 145 lb 4.8 oz (65.9 kg)   LMP  (LMP Unknown)   SpO2 97%   BMI 25.50 kg/m   Wt Readings from Last 3 Encounters:  03/09/18 145 lb 4.8 oz (65.9 kg)  03/08/18 145 lb 3.2 oz (65.9 kg)  12/21/17 145 lb 3.2 oz (65.9 kg)    Physical Exam  Constitutional: She is oriented to person, place, and time. She appears well-developed and well-nourished. No distress.  HENT:  Head: Atraumatic.  Left Ear: External ear normal.  Minimal dried blood within left EAC B/l TMs benign  Eyes: Pupils are equal, round, and reactive to light. Conjunctivae are normal.  Neck: Normal range of motion. Neck supple.  Cardiovascular: Normal rate and normal heart sounds.  Pulmonary/Chest: Effort normal and breath sounds normal.  Musculoskeletal: Normal range of motion.  Lymphadenopathy:    She has cervical adenopathy (right cervical, focal, ttp).    Neurological: She is alert and oriented to person, place, and time.  Skin: Skin is warm and dry.  Psychiatric: She has a normal mood and affect. Her behavior is normal.  Nursing note and vitals reviewed.  Results for orders placed or performed in visit on 11/11/17  CBC with Differential/Platelet  Result Value Ref Range   WBC 9.8 3.4 - 10.8 x10E3/uL   RBC 4.42 3.77 - 5.28 x10E6/uL   Hemoglobin 13.9 11.1 - 15.9 g/dL   Hematocrit 41.1 34.0 - 46.6 %   MCV 93 79 - 97 fL   MCH 31.4 26.6 - 33.0 pg   MCHC 33.8 31.5 - 35.7 g/dL   RDW 13.1 12.3 - 15.4 %   Platelets 331 150 - 379 x10E3/uL   Neutrophils 67 Not Estab. %   Lymphs 26 Not Estab. %   Monocytes 6 Not Estab. %   Eos 1 Not Estab. %   Basos 0 Not Estab. %   Neutrophils Absolute 6.5 1.4 - 7.0 x10E3/uL   Lymphocytes Absolute 2.5 0.7 - 3.1 x10E3/uL   Monocytes Absolute 0.6 0.1 - 0.9 x10E3/uL   EOS (ABSOLUTE) 0.1 0.0 - 0.4 x10E3/uL   Basophils Absolute 0.0 0.0 - 0.2 x10E3/uL   Immature Granulocytes 0 Not  Estab. %   Immature Grans (Abs) 0.0 0.0 - 0.1 x10E3/uL  Comprehensive metabolic panel  Result Value Ref Range   Glucose 93 65 - 99 mg/dL   BUN 10 8 - 27 mg/dL   Creatinine, Ser 0.80 0.57 - 1.00 mg/dL   GFR calc non Af Amer 79 >59 mL/min/1.73   GFR calc Af Amer 91 >59 mL/min/1.73   BUN/Creatinine Ratio 13 12 - 28   Sodium 142 134 - 144 mmol/L   Potassium 4.3 3.5 - 5.2 mmol/L   Chloride 104 96 - 106 mmol/L   CO2 21 20 - 29 mmol/L   Calcium 9.6 8.7 - 10.3 mg/dL   Total Protein 7.0 6.0 - 8.5 g/dL   Albumin 4.6 3.6 - 4.8 g/dL   Globulin, Total 2.4 1.5 - 4.5 g/dL   Albumin/Globulin Ratio 1.9 1.2 - 2.2   Bilirubin Total 0.2 0.0 - 1.2 mg/dL   Alkaline Phosphatase 114 39 - 117 IU/L   AST 14 0 - 40 IU/L   ALT 15 0 - 32 IU/L  Lipid Panel w/o Chol/HDL Ratio  Result Value Ref Range   Cholesterol, Total 138 100 - 199 mg/dL   Triglycerides 102 0 - 149 mg/dL   HDL 41 >39 mg/dL   VLDL Cholesterol Cal 20 5 - 40 mg/dL   LDL Calculated  77 0 - 99 mg/dL  TSH  Result Value Ref Range   TSH 2.670 0.450 - 4.500 uIU/mL  FSH  Result Value Ref Range   FSH 97.8 mIU/mL  Estrogens, Total  Result Value Ref Range   Estrogen 92 pg/mL  LH  Result Value Ref Range   LH 50.6 mIU/mL  VITAMIN D 25 Hydroxy (Vit-D Deficiency, Fractures)  Result Value Ref Range   Vit D, 25-Hydroxy 18.7 (L) 30.0 - 100.0 ng/mL  Vitamin B12  Result Value Ref Range   Vitamin B-12 229 (L) 232 - 1,245 pg/mL  HIV antibody  Result Value Ref Range   HIV Screen 4th Generation wRfx Non Reactive Non Reactive  Hepatitis C antibody  Result Value Ref Range   Hep C Virus Ab <0.1 0.0 - 0.9 s/co ratio      Assessment & Plan:   Problem List Items Addressed This Visit    None    Visit Diagnoses    Acute otitis externa of left ear, unspecified type    -  Primary   Continue fluocinolone drops and cotton balls prn. Return precautions reviewed. TMs both still benign   Lymphadenopathy of right cervical region       Will tx with zpack to cover for infectious causes, can use heat and ibuprofen prn       Follow up plan: Return if symptoms worsen or fail to improve.

## 2018-03-10 NOTE — Patient Instructions (Signed)
Follow up as needed

## 2018-03-12 NOTE — Patient Instructions (Signed)
Follow up as needed

## 2018-05-09 ENCOUNTER — Telehealth: Payer: Self-pay | Admitting: Unknown Physician Specialty

## 2018-05-09 DIAGNOSIS — N950 Postmenopausal bleeding: Secondary | ICD-10-CM

## 2018-05-09 NOTE — Telephone Encounter (Signed)
Copied from [place] [id number]. Topic: Referral - Request >> May 09, 2018  8:48 AM Valerie Donovan wrote: Reason for CRM: Patient states she is having " obgyn " issues and wants Malachy Mood to refer her to a obgyn. She said she feels bloated and having cramps on her left side where her ovaries are. She said she started having her period yesterday 6/3 and still is having it today. She thinks she needs another cervical biopsy, as she had one in the past. She does not want to go to [facility]. Call back @ [phone]

## 2018-05-09 NOTE — Telephone Encounter (Signed)
Yes she does need to go to OB-gyn.  We can refer to [facility] or [facility]

## 2018-05-10 NOTE — Telephone Encounter (Signed)
I have referred patient to [facility]

## 2018-05-11 ENCOUNTER — Ambulatory Visit: Payer: BLUE CROSS/BLUE SHIELD | Admitting: Obstetrics and Gynecology

## 2018-05-11 ENCOUNTER — Encounter: Payer: Self-pay | Admitting: Obstetrics and Gynecology

## 2018-05-11 VITALS — BP 150/80 | HR 60 | Ht 63.0 in | Wt 149.0 lb

## 2018-05-11 DIAGNOSIS — N950 Postmenopausal bleeding: Secondary | ICD-10-CM | POA: Diagnosis not present

## 2018-05-11 DIAGNOSIS — Z124 Encounter for screening for malignant neoplasm of cervix: Secondary | ICD-10-CM

## 2018-05-11 NOTE — Progress Notes (Signed)
Patient ID: Valerie Donovan, female   DOB: June 14, 1956, 62 y.o.   MRN: [id number]  Reason for Consult: Referral (postmenopausal bleeding; sharp pain left side before bleeding starts; had endometrial bx 10-)   Referred by Kathrine Haddock, NP  Subjective:     HPI:  Valerie Donovan is a 62 y.o. female she presents today for consultation regarding postmenopausal bleeding.  She reports that over the last 4 years she has had persistent postmenopausal bleeding.  She reports that the symptoms are generally the same, she will have the pain on her left side that feels like a hot poker followed by bright red bleeding.  She reports that this bleeding pattern occurred recently on Monday May 08, 2018 she had the sharp pain at about 5:00 in evening followed by 2 days of bright red bleeding that was heavy and she passed a small clot that was about the size of a quarter.  She has been evaluated for this problem in the past.  She reports that in 2016 she had a endometrial biopsy.  She has had not had an ultrasound or biopsy within the last year.  The patient reports that she is having regular daily formed stools she denies any blood or mucus in her stools.  She has not noticed any changes in her weight she has reported bloating for 1 week. No early satiety. She is not sexually active and has not been sexually active for 5 years.  Menarche at the age of 34.  Menopause at 63.  She reports regular monthly periods.  She has a history of only one abnormal Pap in her 18s for which she had cryotherapy.  She has a history of 2 uncomplicated vaginal deliveries and 1 second trimester miscarriage after a fall.  She does not have any history of breast uterine or ovarian cancer in her family.  She reports that her father had colon an rectal cancer at the age of 16.  When her mother was 33 she was diagnosed with disseminated cancer in her lungs liver bones and brain, the source of the cancer was uncertain.   Medical records  reviewed.  Past Medical History:  Diagnosis Date  . Anxiety   . GERD (gastroesophageal reflux disease)   . Hyperlipidemia   . Hypertension   . Postmenopausal bleeding 2014; 10/202018; 05/14/18   endometrial bx done 09/2015 - nl; nl paps   Family History  Problem Relation Age of Onset  . Cancer Mother   . Diabetes Mother   . Cancer Father        colon  . Diabetes Sister   . Diabetes Maternal Grandmother   . Diabetes Maternal Grandfather   . Diabetes Sister    Past Surgical History:  Procedure Laterality Date  . TUBAL LIGATION    . WISDOM TOOTH EXTRACTION      Short Social History:  Social History   Tobacco Use  . Smoking status: Current Every Day Smoker    Packs/day: 0.50  . Smokeless tobacco: Never Used  Substance Use Topics  . Alcohol use: No    Frequency: Never    Allergies  Allergen Reactions  . Ciprofloxacin Anaphylaxis  . Clarithromycin Anaphylaxis  . Other Anaphylaxis    ANTIBIOTICS  . Penicillin G Anaphylaxis  . Codeine Itching    vomiting    Current Outpatient Medications  Medication Sig Dispense Refill  . atorvastatin (LIPITOR) 20 MG tablet Take 20 mg by mouth daily.  6  . busPIRone (BUSPAR) 15  MG tablet Take 1 tablet by mouth 2 (two) times daily.  5  . FLUoxetine (PROZAC) 20 MG capsule TAKE 2 CAPSULES BY MOUTH EVERY DAY    . lisinopril (PRINIVIL,ZESTRIL) 10 MG tablet TAKE 1 TABLET BY MOUTH EVERY DAY    . omeprazole (PRILOSEC) 20 MG capsule take 1 capsule dailt PRN     No current facility-administered medications for this visit.     Review of Systems  Constitutional: Negative for chills, fatigue, fever and unexpected weight change.  HENT: Negative for trouble swallowing.  Eyes: Negative for loss of vision.  Respiratory: Negative for cough, shortness of breath and wheezing.  Cardiovascular: Negative for chest pain, leg swelling, palpitations and syncope.  GI: Negative for abdominal pain, blood in stool, diarrhea, nausea and vomiting.  GU:  Negative for difficulty urinating, dysuria, frequency and hematuria.  Musculoskeletal: Negative for back pain, leg pain and joint pain.  Skin: Negative for rash.  Neurological: Negative for dizziness, headaches, light-headedness, numbness and seizures.  Psychiatric: Negative for behavioral problem, confusion, depressed mood and sleep disturbance.        Objective:  Objective   Vitals:   05/11/18 1359  BP: (!) 150/80  Pulse: 60  Weight: 149 lb (67.6 kg)  Height: 5\' 3"  (1.6 m)   Body mass index is 26.39 kg/m.  Physical Exam  Constitutional: She is oriented to person, place, and time. She appears well-developed and well-nourished.  HENT:  Head: Normocephalic and atraumatic.  Eyes: EOM are normal.  Cardiovascular: Normal rate, regular rhythm and normal heart sounds.  Pulmonary/Chest: Effort normal and breath sounds normal.  Genitourinary: Rectum normal, vagina normal and uterus normal. Rectal exam shows no external hemorrhoid, no internal hemorrhoid, no fissure and no tenderness. There is no rash, tenderness or lesion on the right labia. There is no rash, tenderness or lesion on the left labia. Uterus is not enlarged, not fixed and not tender. Cervix exhibits no motion tenderness. Right adnexum displays no mass, no tenderness and no fullness. Left adnexum displays fullness. Left adnexum displays no tenderness. No erythema, tenderness or bleeding in the vagina. No foreign body in the vagina. No signs of injury around the vagina. No vaginal discharge found.  Genitourinary Comments: Left sided adnexal fullness.  Normal 8cm uterus, anteverted. No CMT. Scan amount of blood seen at cervical os.  Normal right ovary. Rectovaginal exam negative. No masses or thickening appreciated.   Neurological: She is alert and oriented to person, place, and time.  Skin: Skin is warm and dry.  Psychiatric: She has a normal mood and affect. Her behavior is normal. Judgment and thought content normal.  Nursing  note and vitals reviewed.  Endometrial Biopsy After discussion with the patient regarding her abnormal uterine bleeding I recommended that she proceed with an endometrial biopsy for further diagnosis. The risks, benefits, alternatives, and indications for an endometrial biopsy were discussed with the patient in detail. She understood the risks including infection, bleeding, cervical laceration and uterine perforation.  Verbal consent was obtained.   PROCEDURE NOTE:  P[place]elle endometrial biopsy was performed using aseptic technique with iodine preparation.  The uterus was sounded to a length of 7 cm.  Adequate sampling was not obtained with minimal blood loss.  The patient tolerated the procedure well.  Disposition will be pending pathology.       Assessment/Plan:    62 yo with postmenopausal uterine bleeding 1. Endometrial sampling attempted today, but very scant tissue returned. Will obtain pelvic ultrasound. If unable to obtain tissue  sample can consider hysteroscopy dilation and curettage especially since bleeding has been persistent.  Patient given handout about postmenopausal bleeding.  Pap smear performed today.   Patient does need a colonoscopy, her last colonoscopy was in 2014. 4 polyps were removed. Her insurance has not been willing to pay for it, she would like to wait until next year when she has social security benefits kick in.  Encouraged patient to follow up with GI physician.   Adrian Prows MD [facility] 05/11/18 7:05 PM

## 2018-05-11 NOTE — Patient Instructions (Signed)
Endometrial Biopsy, Care After This sheet gives you information about how to care for yourself after your procedure. Your health care provider may also give you more specific instructions. If you have problems or questions, contact your health care provider. What can I expect after the procedure? After the procedure, it is common to have:  Mild cramping.  A small amount of vaginal bleeding for a few days. This is normal.  Follow these instructions at home:  Take over-the-counter and prescription medicines only as told by your health care provider.  Do not douche, use tampons, or have sexual intercourse until your health care provider approves.  Return to your normal activities as told by your health care provider. Ask your health care provider what activities are safe for you.  Follow instructions from your health care provider about any activity restrictions, such as restrictions on strenuous exercise or heavy lifting. Contact a health care provider if:  You have heavy bleeding, or bleed for longer than 2 days after the procedure.  You have bad smelling discharge from your vagina.  You have a fever or chills.  You have a burning sensation when urinating or you have difficulty urinating.  You have severe pain in your lower abdomen. Get help right away if:  You have severe cramps in your stomach or back.  You pass large blood clots.  Your bleeding increases.  You become weak or light-headed, or you pass out. Summary  After the procedure, it is common to have mild cramping and a small amount of vaginal bleeding for a few days.  Do not douche, use tampons, or have sexual intercourse until your health care provider approves.  Return to your normal activities as told by your health care provider. Ask your health care provider what activities are safe for you. This information is not intended to replace advice given to you by your health care provider. Make sure you discuss any  questions you have with your health care provider. Document Released: 09/12/2013 Document Revised: 12/08/2016 Document Reviewed: 12/08/2016 Elsevier Interactive Patient Education  2017 Elsevier Inc.   Postmenopausal Bleeding Postmenopausal bleeding is any bleeding after menopause. Menopause is when a woman's period stops. Any type of bleeding after menopause is concerning. It should be checked by your doctor. Any treatment will depend on the cause. Follow these instructions at home: Watch your condition for any changes.  Avoid the use of tampons and douches as told by your doctor.  Change your pads often.  Get regular pelvic exams and Pap tests.  Keep all appointments for tests as told by your doctor.  Contact a doctor if:  Your bleeding lasts for more than 1 week.  You have belly (abdominal) pain.  You have bleeding after sex (intercourse). Get help right away if:  You have a fever, chills, a headache, dizziness, muscle aches, and bleeding.  You have strong pain with bleeding.  You have clumps of blood (blood clots) coming from your vagina.  You have bleeding and need more than 1 pad an hour.  You feel like you are going to pass out (faint). This information is not intended to replace advice given to you by your health care provider. Make sure you discuss any questions you have with your health care provider. Document Released: 08/31/2008 Document Revised: 04/29/2016 Document Reviewed: 06/21/2013 Elsevier Interactive Patient Education  2017 Elsevier Inc.  

## 2018-05-14 LAB — PAPIG, HPV, RFX 16/18
HPV, HIGH-RISK: NEGATIVE
PAP Smear Comment: 0

## 2018-05-15 LAB — PATHOLOGY

## 2018-05-15 NOTE — Progress Notes (Signed)
Released to mychart

## 2018-05-22 ENCOUNTER — Other Ambulatory Visit: Payer: Self-pay | Admitting: Unknown Physician Specialty

## 2018-05-22 ENCOUNTER — Telehealth: Payer: Self-pay | Admitting: Unknown Physician Specialty

## 2018-05-22 MED ORDER — LISINOPRIL 10 MG PO TABS: 10.0000 mg | ORAL_TABLET | Freq: Every day | ORAL | 1 refills | Status: DC

## 2018-05-22 NOTE — Telephone Encounter (Signed)
Copied from [place] [id number]. Topic: Quick Communication - Rx Refill/Question >> May 22, 2018  1:15 PM Scherrie Gerlach wrote: Medication: lisinopril (PRINIVIL,ZESTRIL) 10 MG tablet  Has the patient contacted their pharmacy? Yes pt states pharmacy still waiting Pt states they may have sent to old pcp Pt took her last pill yesterday [facility] [place], [place] - [address] [phone] (Phone) [phone] (Fax)

## 2018-05-22 NOTE — Progress Notes (Signed)
Normal biopsy and pap smear

## 2018-05-29 ENCOUNTER — Ambulatory Visit (INDEPENDENT_AMBULATORY_CARE_PROVIDER_SITE_OTHER): Payer: BLUE CROSS/BLUE SHIELD

## 2018-05-29 ENCOUNTER — Ambulatory Visit (INDEPENDENT_AMBULATORY_CARE_PROVIDER_SITE_OTHER): Payer: BLUE CROSS/BLUE SHIELD | Admitting: Obstetrics and Gynecology

## 2018-05-29 ENCOUNTER — Encounter: Payer: Self-pay | Admitting: Obstetrics and Gynecology

## 2018-05-29 VITALS — BP 136/80 | HR 67 | Ht 63.0 in | Wt 148.0 lb

## 2018-05-29 DIAGNOSIS — N950 Postmenopausal bleeding: Secondary | ICD-10-CM

## 2018-05-29 DIAGNOSIS — R9389 Abnormal findings on diagnostic imaging of other specified body structures: Secondary | ICD-10-CM

## 2018-05-29 NOTE — Progress Notes (Signed)
Patient ID: Valerie Donovan, female   DOB: 10/01/1956, 62 y.o.   MRN: [id number]  Reason for Consult: Follow-up (GYN U/S)   Referred by Kathrine Haddock, NP  Subjective:     HPI:  Valerie Donovan is a 62 y.o. female . She is following up today for an ultrasound to evaluate her postmenopausal bleeding. She previously had an endometrial biopsy which showed normal tissue.    Past Medical History:  Diagnosis Date  . Anxiety   . GERD (gastroesophageal reflux disease)   . Hyperlipidemia   . Hypertension   . Postmenopausal bleeding 2014; 10/202018; 05/14/18   endometrial bx done 09/2015 - nl; nl paps   Family History  Problem Relation Age of Onset  . Cancer Mother   . Diabetes Mother   . Cancer Father        colon  . Diabetes Sister   . Diabetes Maternal Grandmother   . Diabetes Maternal Grandfather   . Diabetes Sister    Past Surgical History:  Procedure Laterality Date  . TUBAL LIGATION    . WISDOM TOOTH EXTRACTION      Short Social History:  Social History   Tobacco Use  . Smoking status: Current Every Day Smoker    Packs/day: 0.50  . Smokeless tobacco: Never Used  Substance Use Topics  . Alcohol use: No    Frequency: Never    Allergies  Allergen Reactions  . Ciprofloxacin Anaphylaxis  . Clarithromycin Anaphylaxis  . Other Anaphylaxis    ANTIBIOTICS  . Penicillin G Anaphylaxis  . Codeine Itching    vomiting    Current Outpatient Medications  Medication Sig Dispense Refill  . atorvastatin (LIPITOR) 20 MG tablet Take 20 mg by mouth daily.  6  . busPIRone (BUSPAR) 15 MG tablet Take 1 tablet by mouth 2 (two) times daily.  5  . FLUoxetine (PROZAC) 20 MG capsule TAKE 2 CAPSULES BY MOUTH EVERY DAY    . lisinopril (PRINIVIL,ZESTRIL) 10 MG tablet Take 1 tablet (10 mg total) by mouth daily. 90 tablet 1  . omeprazole (PRILOSEC) 20 MG capsule take 1 capsule dailt PRN     No current facility-administered medications for this visit.     Review of Systems    Constitutional: Negative for chills, fatigue, fever and unexpected weight change.  HENT: Negative for trouble swallowing.  Eyes: Negative for loss of vision.  Respiratory: Negative for cough, shortness of breath and wheezing.  Cardiovascular: Negative for chest pain, leg swelling, palpitations and syncope.  GI: Negative for abdominal pain, blood in stool, diarrhea, nausea and vomiting.  GU: Negative for difficulty urinating, dysuria, frequency and hematuria.  Musculoskeletal: Negative for back pain, leg pain and joint pain.  Skin: Negative for rash.  Neurological: Negative for dizziness, headaches, light-headedness, numbness and seizures.  Psychiatric: Negative for behavioral problem, confusion, depressed mood and sleep disturbance.        Objective:  Objective   Vitals:   05/29/18 1018  BP: 136/80  Pulse: 67  Weight: 148 lb (67.1 kg)  Height: 5\' 3"  (1.6 m)   Body mass index is 26.22 kg/m.  Physical Exam  Constitutional: She is oriented to person, place, and time. She appears well-developed and well-nourished.  HENT:  Head: Normocephalic and atraumatic.  Eyes: EOM are normal.  Cardiovascular: Normal rate, regular rhythm and normal heart sounds.  Pulmonary/Chest: Effort normal and breath sounds normal.  Neurological: She is alert and oriented to person, place, and time.  Skin: Skin is warm and  dry.  Psychiatric: She has a normal mood and affect. Her behavior is normal. Judgment and thought content normal.  Nursing note and vitals reviewed.  US Pelvis Transvanginal Non-ob (tv Only)  Result Date: 05/29/2018 Patient Name: Valerie Donovan DOB: 11-06-1956 MRN: [id number] ULTRASOUND REPORT Location: [facility] Date of Service: 05/29/2018 Indications:postmenapausal bleeding Findings: The uterus is anteverted and measures 5.97x4.49x2.7cm. Echo texture is homogenous without evidence of focal masses. The Endometrium measures 9.22 mm. Appears heterogeneous with A calcification =  0.23cm is seen in the lower part ( possibility of endometrial hyperplasia vs others) Right Ovary measures 1.42x0.96x0.87 cm. It is normal in appearance. Left Ovary measures 1.52x1.46x1.23 cm. It is normal in appearance. Survey of the adnexa demonstrates no adnexal masses. There is no free fluid in the cul de sac. Impression: 1. The Endometrium measures 9.22 mm. Appears heterogeneous with A calcification = 0.23cm is seen in the lower part ( possibility of endometrial hyperplasia vs others) Recommendations: 1.Clinical correlation with the patient's History and Physical Exam. Abeer Alsammarraie RDMS I have reviewed this ultrasound and the report. I agree with the above assessment and plan. Adrian Prows MD [facility] 05/29/18 10:48 AM         Assessment/Plan:     62 yo with postmenopausal bleeding and a thickened endometrium. Her initial pathology from biopsy was negative for hyperplasia. Given the thickness of her endometrium and her symptoms will go to the OR to repeat the uterine sampling by hysteroscopy dilation and curettage. Consents obtained today.  Adrian Prows MD [facility] 05/29/18 11:13 AM

## 2018-05-29 NOTE — H&P (View-Only) (Signed)
Patient ID: Kensly Bowmer Haman, female   DOB: 09-27-1956, 62 y.o.   MRN: [id number]  Reason for Consult: Follow-up (GYN U/S)   Referred by Kathrine Haddock, NP  Subjective:     HPI:  Beverlie Kurihara Dowe is a 62 y.o. female . She is following up today for an ultrasound to evaluate her postmenopausal bleeding. She previously had an endometrial biopsy which showed normal tissue.    Past Medical History:  Diagnosis Date  . Anxiety   . GERD (gastroesophageal reflux disease)   . Hyperlipidemia   . Hypertension   . Postmenopausal bleeding 2014; 10/202018; 05/14/18   endometrial bx done 09/2015 - nl; nl paps   Family History  Problem Relation Age of Onset  . Cancer Mother   . Diabetes Mother   . Cancer Father        colon  . Diabetes Sister   . Diabetes Maternal Grandmother   . Diabetes Maternal Grandfather   . Diabetes Sister    Past Surgical History:  Procedure Laterality Date  . TUBAL LIGATION    . WISDOM TOOTH EXTRACTION      Short Social History:  Social History   Tobacco Use  . Smoking status: Current Every Day Smoker    Packs/day: 0.50  . Smokeless tobacco: Never Used  Substance Use Topics  . Alcohol use: No    Frequency: Never    Allergies  Allergen Reactions  . Ciprofloxacin Anaphylaxis  . Clarithromycin Anaphylaxis  . Other Anaphylaxis    ANTIBIOTICS  . Penicillin G Anaphylaxis  . Codeine Itching    vomiting    Current Outpatient Medications  Medication Sig Dispense Refill  . atorvastatin (LIPITOR) 20 MG tablet Take 20 mg by mouth daily.  6  . busPIRone (BUSPAR) 15 MG tablet Take 1 tablet by mouth 2 (two) times daily.  5  . FLUoxetine (PROZAC) 20 MG capsule TAKE 2 CAPSULES BY MOUTH EVERY DAY    . lisinopril (PRINIVIL,ZESTRIL) 10 MG tablet Take 1 tablet (10 mg total) by mouth daily. 90 tablet 1  . omeprazole (PRILOSEC) 20 MG capsule take 1 capsule dailt PRN     No current facility-administered medications for this visit.     Review of Systems    Constitutional: Negative for chills, fatigue, fever and unexpected weight change.  HENT: Negative for trouble swallowing.  Eyes: Negative for loss of vision.  Respiratory: Negative for cough, shortness of breath and wheezing.  Cardiovascular: Negative for chest pain, leg swelling, palpitations and syncope.  GI: Negative for abdominal pain, blood in stool, diarrhea, nausea and vomiting.  GU: Negative for difficulty urinating, dysuria, frequency and hematuria.  Musculoskeletal: Negative for back pain, leg pain and joint pain.  Skin: Negative for rash.  Neurological: Negative for dizziness, headaches, light-headedness, numbness and seizures.  Psychiatric: Negative for behavioral problem, confusion, depressed mood and sleep disturbance.        Objective:  Objective   Vitals:   05/29/18 1018  BP: 136/80  Pulse: 67  Weight: 148 lb (67.1 kg)  Height: 5\' 3"  (1.6 m)   Body mass index is 26.22 kg/m.  Physical Exam  Constitutional: She is oriented to person, place, and time. She appears well-developed and well-nourished.  HENT:  Head: Normocephalic and atraumatic.  Eyes: EOM are normal.  Cardiovascular: Normal rate, regular rhythm and normal heart sounds.  Pulmonary/Chest: Effort normal and breath sounds normal.  Neurological: She is alert and oriented to person, place, and time.  Skin: Skin is warm and  dry.  Psychiatric: She has a normal mood and affect. Her behavior is normal. Judgment and thought content normal.  Nursing note and vitals reviewed.  US Pelvis Transvanginal Non-ob (tv Only)  Result Date: 05/29/2018 Patient Name: Jaleia Hanke Mccorkle DOB: 1956-05-01 MRN: [id number] ULTRASOUND REPORT Location: [facility] Date of Service: 05/29/2018 Indications:postmenapausal bleeding Findings: The uterus is anteverted and measures 5.97x4.49x2.7cm. Echo texture is homogenous without evidence of focal masses. The Endometrium measures 9.22 mm. Appears heterogeneous with A calcification =  0.23cm is seen in the lower part ( possibility of endometrial hyperplasia vs others) Right Ovary measures 1.42x0.96x0.87 cm. It is normal in appearance. Left Ovary measures 1.52x1.46x1.23 cm. It is normal in appearance. Survey of the adnexa demonstrates no adnexal masses. There is no free fluid in the cul de sac. Impression: 1. The Endometrium measures 9.22 mm. Appears heterogeneous with A calcification = 0.23cm is seen in the lower part ( possibility of endometrial hyperplasia vs others) Recommendations: 1.Clinical correlation with the patient's History and Physical Exam. Abeer Alsammarraie RDMS I have reviewed this ultrasound and the report. I agree with the above assessment and plan. Adrian Prows MD [facility] 05/29/18 10:48 AM         Assessment/Plan:     62 yo with postmenopausal bleeding and a thickened endometrium. Her initial pathology from biopsy was negative for hyperplasia. Given the thickness of her endometrium and her symptoms will go to the OR to repeat the uterine sampling by hysteroscopy dilation and curettage. Consents obtained today.  Adrian Prows MD [facility] 05/29/18 11:13 AM

## 2018-05-30 ENCOUNTER — Telehealth: Payer: Self-pay | Admitting: Obstetrics and Gynecology

## 2018-05-30 NOTE — Telephone Encounter (Signed)
Patient is aware of OR on 06/15/18. Patient has MyChart and will watch for notification of Pre-admit Testing phone interview. Patient is aware she may receive calls from the [facility] and [facility]. Patient confirmed BCBS, and no secondary insurance. Ext given.

## 2018-05-30 NOTE — Telephone Encounter (Signed)
Orders are in, thank you

## 2018-05-30 NOTE — Telephone Encounter (Signed)
-----   Message from Homero Fellers, MD sent at 05/29/2018 11:11 AM EDT ----- Surgery Booking Request Patient Full Name:  Valerie Donovan  MRN: [id number]  DOB: March 27, 1956  Surgeon: Homero Fellers, MD  Requested Surgery Date and Time: beginning of july Primary Diagnosis AND Code: postmenopausal bleeding Secondary Diagnosis and Code:  Surgical Procedure: hysteroscopy D&C L&D Notification: No Admission Status: same day surgery Length of Surgery: 1 hour Special Case Needs: none H&P: not needed, consents signed today 05/29/18 (date) Phone Interview???: yes Interpreter: Language:  Medical Clearance: no Special Scheduling Instructions: none

## 2018-06-07 ENCOUNTER — Other Ambulatory Visit: Payer: Self-pay

## 2018-06-07 ENCOUNTER — Encounter
Admission: RE | Admit: 2018-06-07 | Discharge: 2018-06-07 | Disposition: A | Discharge status: Home / Self Care | Payer: BLUE CROSS/BLUE SHIELD | Source: Ambulatory Visit | Attending: Obstetrics and Gynecology | Admitting: Obstetrics and Gynecology

## 2018-06-07 DIAGNOSIS — N950 Postmenopausal bleeding: Secondary | ICD-10-CM | POA: Insufficient documentation

## 2018-06-07 DIAGNOSIS — Z01818 Encounter for other preprocedural examination: Secondary | ICD-10-CM | POA: Insufficient documentation

## 2018-06-07 DIAGNOSIS — R9389 Abnormal findings on diagnostic imaging of other specified body structures: Secondary | ICD-10-CM | POA: Insufficient documentation

## 2018-06-07 DIAGNOSIS — I10 Essential (primary) hypertension: Secondary | ICD-10-CM | POA: Insufficient documentation

## 2018-06-07 HISTORY — DX: Unspecified osteoarthritis, unspecified site: M19.90

## 2018-06-07 LAB — TYPE AND SCREEN
ABO/RH(D): O POS
Antibody Screen: NEGATIVE

## 2018-06-07 LAB — CBC
HEMATOCRIT: 40.9 % (ref 35.0–47.0)
Hemoglobin: 14.2 g/dL (ref 12.0–16.0)
MCH: 32.4 pg (ref 26.0–34.0)
MCHC: 34.8 g/dL (ref 32.0–36.0)
MCV: 93.1 fL (ref 80.0–100.0)
Platelets: 274 10*3/uL (ref 150–440)
RBC: 4.4 MIL/uL (ref 3.80–5.20)
RDW: 12.3 % (ref 11.5–14.5)
WBC: 8 10*3/uL (ref 3.6–11.0)

## 2018-06-07 NOTE — Patient Instructions (Signed)
Your procedure is scheduled on: 06-15-18 THURSDAY Report to Same Day Surgery 2nd floor medical mall [facility] Entrance-take elevator on left to 2nd floor.  Check in with surgery information desk.) To find out your arrival time please call [phone] between 1PM - 3PM on 06-14-18 [facility]  Remember: Instructions that are not followed completely may result in serious medical risk, up to and including death, or upon the discretion of your surgeon and anesthesiologist your surgery may need to be rescheduled.    _x___ 1. Do not eat food after midnight the night before your procedure. NO GUM OR CANDY AFTER MIDNIGHT.  You may drink clear liquids up to 2 hours before you are scheduled to arrive at the hospital for your procedure.  Do not drink clear liquids within 2 hours of your scheduled arrival to the hospital.  Clear liquids include  --Water or Apple juice without pulp  --Clear carbohydrate beverage such as ClearFast or Gatorade  --Black Coffee or Clear Tea (No milk, no creamers, do not add anything to the coffee or Tea    __x__ 2. No Alcohol for 24 hours before or after surgery.   __x__3. No Smoking or e-cigarettes for 24 prior to surgery.  Do not use any chewable tobacco products for at least 6 hour prior to surgery   ____  4. Bring all medications with you on the day of surgery if instructed.    __x__ 5. Notify your doctor if there is any change in your medical condition     (cold, fever, infections).    x___6. On the morning of surgery brush your teeth with toothpaste and water.  You may rinse your mouth with mouth wash if you wish.  Do not swallow any toothpaste or mouthwash.   Do not wear jewelry, make-up, hairpins, clips or nail polish.  Do not wear lotions, powders, or perfumes. You may wear deodorant.  Do not shave 48 hours prior to surgery. Men may shave face and neck.  Do not bring valuables to the hospital.    [facility] is not responsible for any belongings or  valuables.               Contacts, dentures or bridgework may not be worn into surgery.  Leave your suitcase in the car. After surgery it may be brought to your room.  For patients admitted to the hospital, discharge time is determined by your treatment team.  _  Patients discharged the day of surgery will not be allowed to drive home.  You will need someone to drive you home and stay with you the night of your procedure.    Please read over the following fact sheets that you were given:   [facility] Preparing for Surgery and or MRSA Information   _x___ TAKE THE FOLLOWING MEDICATION THE MORNING OF SURGERY WITH A SMALL SIP OF WATER. These include:  1. PROZAC (FLUOXETINE)  2. BUSPAR (BUSPIRONE)  3. LIPITOR (ATORVASTATIN)  4. PRILOSEC (OMEPRAZOLE)  5. TAKE A PRILOSEC THE NIGHT BEFORE YOUR SURGERY  6.  ____Fleets enema or Magnesium Citrate as directed.   ____ Use CHG Soap or sage wipes as directed on instruction sheet   ____ Use inhalers on the day of surgery and bring to hospital day of surgery  ____ Stop Metformin and Janumet 2 days prior to surgery.    ____ Take 1/2 of usual insulin dose the night before surgery and none on the morning surgery.   ____ Follow recommendations from  Cardiologist, Pulmonologist or PCP regarding stopping Aspirin, Coumadin, Plavix ,Eliquis, Effient, or Pradaxa, and Pletal.  X____Stop Anti-inflammatories such as Advil, Aleve, Ibuprofen, Motrin, Naproxen, Naprosyn, Goodies powders or aspirin products NOW-OK to take Tylenol    ____ Stop supplements until after surgery.     ____ Bring C-Pap to the hospital.

## 2018-06-15 ENCOUNTER — Ambulatory Visit: Payer: BLUE CROSS/BLUE SHIELD | Admitting: Anesthesiology

## 2018-06-15 ENCOUNTER — Other Ambulatory Visit: Payer: Self-pay

## 2018-06-15 ENCOUNTER — Ambulatory Visit
Admission: RE | Admit: 2018-06-15 | Discharge: 2018-06-15 | Disposition: A | Discharge status: Home / Self Care | Payer: BLUE CROSS/BLUE SHIELD | Source: Ambulatory Visit | Attending: Obstetrics and Gynecology | Admitting: Obstetrics and Gynecology

## 2018-06-15 ENCOUNTER — Encounter: Payer: Self-pay | Admitting: *Deleted

## 2018-06-15 ENCOUNTER — Encounter
Admission: RE | Disposition: A | Discharge status: Home / Self Care | Payer: Self-pay | Source: Ambulatory Visit | Attending: Obstetrics and Gynecology

## 2018-06-15 DIAGNOSIS — F17210 Nicotine dependence, cigarettes, uncomplicated: Secondary | ICD-10-CM | POA: Insufficient documentation

## 2018-06-15 DIAGNOSIS — Z87892 Personal history of anaphylaxis: Secondary | ICD-10-CM | POA: Insufficient documentation

## 2018-06-15 DIAGNOSIS — Z885 Allergy status to narcotic agent status: Secondary | ICD-10-CM | POA: Insufficient documentation

## 2018-06-15 DIAGNOSIS — Z9851 Tubal ligation status: Secondary | ICD-10-CM | POA: Insufficient documentation

## 2018-06-15 DIAGNOSIS — Z881 Allergy status to other antibiotic agents status: Secondary | ICD-10-CM | POA: Insufficient documentation

## 2018-06-15 DIAGNOSIS — E785 Hyperlipidemia, unspecified: Secondary | ICD-10-CM | POA: Insufficient documentation

## 2018-06-15 DIAGNOSIS — N950 Postmenopausal bleeding: Secondary | ICD-10-CM | POA: Insufficient documentation

## 2018-06-15 DIAGNOSIS — I10 Essential (primary) hypertension: Secondary | ICD-10-CM | POA: Insufficient documentation

## 2018-06-15 DIAGNOSIS — Z800 Family history of malignant neoplasm of digestive organs: Secondary | ICD-10-CM | POA: Insufficient documentation

## 2018-06-15 DIAGNOSIS — R9389 Abnormal findings on diagnostic imaging of other specified body structures: Secondary | ICD-10-CM | POA: Diagnosis not present

## 2018-06-15 DIAGNOSIS — F419 Anxiety disorder, unspecified: Secondary | ICD-10-CM | POA: Insufficient documentation

## 2018-06-15 DIAGNOSIS — N840 Polyp of corpus uteri: Secondary | ICD-10-CM | POA: Insufficient documentation

## 2018-06-15 DIAGNOSIS — K219 Gastro-esophageal reflux disease without esophagitis: Secondary | ICD-10-CM | POA: Insufficient documentation

## 2018-06-15 DIAGNOSIS — Z880 Allergy status to penicillin: Secondary | ICD-10-CM | POA: Insufficient documentation

## 2018-06-15 DIAGNOSIS — Z79899 Other long term (current) drug therapy: Secondary | ICD-10-CM | POA: Insufficient documentation

## 2018-06-15 HISTORY — PX: HYSTEROSCOPY WITH D & C: SHX1775

## 2018-06-15 LAB — ABO/RH: ABO/RH(D): O POS

## 2018-06-15 SURGERY — DILATATION AND CURETTAGE /HYSTEROSCOPY
Anesthesia: General | Site: Vagina | Wound class: Clean Contaminated

## 2018-06-15 MED ORDER — FENTANYL CITRATE (PF) 100 MCG/2ML IJ SOLN
25.0000 ug | INTRAMUSCULAR | Status: DC | PRN
  Administered 2018-06-15: 25 ug via INTRAVENOUS

## 2018-06-15 MED ORDER — ACETAMINOPHEN 160 MG/5ML PO SOLN
325.0000 mg | ORAL | Status: DC | PRN
  Filled 2018-06-15: qty 20.3

## 2018-06-15 MED ORDER — MIDAZOLAM HCL 2 MG/2ML IJ SOLN
INTRAMUSCULAR | Status: AC
  Filled 2018-06-15: qty 2

## 2018-06-15 MED ORDER — LIDOCAINE HCL (CARDIAC) PF 100 MG/5ML IV SOSY
PREFILLED_SYRINGE | INTRAVENOUS | Status: DC | PRN
  Administered 2018-06-15: 80 mg via INTRAVENOUS

## 2018-06-15 MED ORDER — PROMETHAZINE HCL 25 MG/ML IJ SOLN: 6.2500 mg | INTRAMUSCULAR | Status: DC | PRN

## 2018-06-15 MED ORDER — GLYCOPYRROLATE 0.2 MG/ML IJ SOLN
INTRAMUSCULAR | Status: DC | PRN
  Administered 2018-06-15: 0.1 mg via INTRAVENOUS

## 2018-06-15 MED ORDER — LACTATED RINGERS IV SOLN
INTRAVENOUS | Status: DC
  Administered 2018-06-15: 14:00:00 via INTRAVENOUS

## 2018-06-15 MED ORDER — MIDAZOLAM HCL 2 MG/2ML IJ SOLN
INTRAMUSCULAR | Status: DC | PRN
  Administered 2018-06-15: 2 mg via INTRAVENOUS

## 2018-06-15 MED ORDER — ACETAMINOPHEN 10 MG/ML IV SOLN
INTRAVENOUS | Status: DC | PRN
  Administered 2018-06-15: 1000 mg via INTRAVENOUS

## 2018-06-15 MED ORDER — ONDANSETRON HCL 4 MG/2ML IJ SOLN
INTRAMUSCULAR | Status: AC
  Filled 2018-06-15: qty 2

## 2018-06-15 MED ORDER — EPHEDRINE SULFATE 50 MG/ML IJ SOLN
INTRAMUSCULAR | Status: DC | PRN
  Administered 2018-06-15: 10 mg via INTRAVENOUS

## 2018-06-15 MED ORDER — PROPOFOL 10 MG/ML IV BOLUS
INTRAVENOUS | Status: AC
  Filled 2018-06-15: qty 20

## 2018-06-15 MED ORDER — FENTANYL CITRATE (PF) 100 MCG/2ML IJ SOLN
INTRAMUSCULAR | Status: AC
  Administered 2018-06-15: 25 ug via INTRAVENOUS
  Filled 2018-06-15: qty 2

## 2018-06-15 MED ORDER — FENTANYL CITRATE (PF) 100 MCG/2ML IJ SOLN
INTRAMUSCULAR | Status: AC
  Filled 2018-06-15: qty 2

## 2018-06-15 MED ORDER — ACETAMINOPHEN 325 MG PO TABS: 325.0000 mg | ORAL_TABLET | ORAL | Status: DC | PRN

## 2018-06-15 MED ORDER — PROPOFOL 10 MG/ML IV BOLUS
INTRAVENOUS | Status: DC | PRN
  Administered 2018-06-15: 130 mg via INTRAVENOUS

## 2018-06-15 MED ORDER — IBUPROFEN 800 MG PO TABS: 800.0000 mg | ORAL_TABLET | Freq: Three times a day (TID) | ORAL | 0 refills | Status: AC | PRN

## 2018-06-15 MED ORDER — DEXAMETHASONE SODIUM PHOSPHATE 10 MG/ML IJ SOLN
INTRAMUSCULAR | Status: DC | PRN
  Administered 2018-06-15: 8 mg via INTRAVENOUS

## 2018-06-15 MED ORDER — ONDANSETRON HCL 4 MG/2ML IJ SOLN
INTRAMUSCULAR | Status: DC | PRN
  Administered 2018-06-15: 4 mg via INTRAVENOUS

## 2018-06-15 MED ORDER — ACETAMINOPHEN NICU IV SYRINGE 10 MG/ML
INTRAVENOUS | Status: AC
  Filled 2018-06-15: qty 1

## 2018-06-15 MED ORDER — FENTANYL CITRATE (PF) 100 MCG/2ML IJ SOLN
INTRAMUSCULAR | Status: DC | PRN
  Administered 2018-06-15: 50 ug via INTRAVENOUS

## 2018-06-15 MED ORDER — MEPERIDINE HCL 50 MG/ML IJ SOLN: 6.2500 mg | INTRAMUSCULAR | Status: DC | PRN

## 2018-06-15 MED ORDER — DEXAMETHASONE SODIUM PHOSPHATE 10 MG/ML IJ SOLN
INTRAMUSCULAR | Status: AC
  Filled 2018-06-15: qty 1

## 2018-06-15 MED ORDER — LACTATED RINGERS IV SOLN: INTRAVENOUS | Status: DC

## 2018-06-15 MED ORDER — HYDROMORPHONE HCL 1 MG/ML IJ SOLN
INTRAMUSCULAR | Status: AC
  Filled 2018-06-15: qty 1

## 2018-06-15 MED ORDER — PHENYLEPHRINE HCL 10 MG/ML IJ SOLN
INTRAMUSCULAR | Status: DC | PRN
  Administered 2018-06-15: 50 ug via INTRAVENOUS

## 2018-06-15 SURGICAL SUPPLY — 21 items
BAG COUNTER SPONGE EZ (MISCELLANEOUS) ×2 IMPLANT
CANISTER SUC SOCK COL 7IN (MISCELLANEOUS) ×2 IMPLANT
CATH ROBINSON RED A/P 16FR (CATHETERS) ×2 IMPLANT
DEVICE MYOSURE LITE (MISCELLANEOUS) IMPLANT
DEVICE MYOSURE REACH (MISCELLANEOUS) IMPLANT
ELECT REM PT RETURN 9FT ADLT (ELECTROSURGICAL) ×2
ELECTRODE REM PT RTRN 9FT ADLT (ELECTROSURGICAL) ×1 IMPLANT
GLOVE BIO SURGEON STRL SZ 6.5 (GLOVE) ×2 IMPLANT
GLOVE BIOGEL PI IND STRL 6.5 (GLOVE) ×1 IMPLANT
GLOVE BIOGEL PI INDICATOR 6.5 (GLOVE) ×1
GOWN STRL REUS W/ TWL LRG LVL3 (GOWN DISPOSABLE) ×2 IMPLANT
GOWN STRL REUS W/TWL LRG LVL3 (GOWN DISPOSABLE) ×2
PACK DNC HYST (MISCELLANEOUS) ×2 IMPLANT
PAD OB MATERNITY 4.3X12.25 (PERSONAL CARE ITEMS) ×2 IMPLANT
PAD PREP 24X41 OB/GYN DISP (PERSONAL CARE ITEMS) ×2 IMPLANT
SOL .9 NS 3000ML IRR  AL (IV SOLUTION) ×1
SOL .9 NS 3000ML IRR UROMATIC (IV SOLUTION) ×1 IMPLANT
STRAP SAFETY 5IN WIDE (MISCELLANEOUS) ×2 IMPLANT
TOWEL OR 17X26 4PK STRL BLUE (TOWEL DISPOSABLE) ×2 IMPLANT
TUBING CONNECTING 10 (TUBING) ×2 IMPLANT
TUBING HYSTEROSCOPY DOLPHIN (MISCELLANEOUS) ×2 IMPLANT

## 2018-06-15 NOTE — Discharge Instructions (Addendum)
Hysteroscopy, Care After Refer to this sheet in the next few weeks. These instructions provide you with information on caring for yourself after your procedure. Your health care provider may also give you more specific instructions. Your treatment has been planned according to current medical practices, but problems sometimes occur. Call your health care provider if you have any problems or questions after your procedure. What can I expect after the procedure? After your procedure, it is typical to have the following:  You may have some cramping. This normally lasts for a couple days.  You may have bleeding. This can vary from light spotting for a few days to menstrual-like bleeding for 3-7 days.  Follow these instructions at home:  Rest for the first 1-2 days after the procedure.  Only take over-the-counter or prescription medicines as directed by your health care provider. Do not take aspirin. It can increase the chances of bleeding.  Take showers instead of baths for 2 weeks or as directed by your health care provider.  Do not drive for 24 hours or as directed.  Do not drink alcohol while taking pain medicine.  Do not use tampons, douche, or have sexual intercourse for 2 weeks or until your health care provider says it is okay.  Take your temperature twice a day for 4-5 days. Write it down each time.  Follow your health care provider's advice about diet, exercise, and lifting.  If you develop constipation, you may: ? Take a mild laxative if your health care provider approves. ? Add bran foods to your diet. ? Drink enough fluids to keep your urine clear or pale yellow.  Try to have someone with you or available to you for the first 24-48 hours, especially if you were given a general anesthetic.  Follow up with your health care provider as directed. Contact a health care provider if:  You feel dizzy or lightheaded.  You feel sick to your stomach (nauseous).  You have  abnormal vaginal discharge.  You have a rash.  You have pain that is not controlled with medicine. Get help right away if:  You have bleeding that is heavier than a normal menstrual period.  You have a fever.  You have increasing cramps or pain, not controlled with medicine.  You have new belly (abdominal) pain.  You pass out.  You have pain in the tops of your shoulders (shoulder strap areas).  You have shortness of breath. This information is not intended to replace advice given to you by your health care provider. Make sure you discuss any questions you have with your health care provider. Document Released: 09/12/2013 Document Revised: 04/29/2016 Document Reviewed: 06/21/2013 Elsevier Interactive Patient Education  2017 [place]   1) The drugs that you were given will stay in your system until tomorrow so for the next 24 hours you should not:  A) Drive an automobile B) Make any legal decisions C) Drink any alcoholic beverage   2) You may resume regular meals tomorrow.  Today it is better to start with liquids and gradually work up to solid foods.  You may eat anything you prefer, but it is better to start with liquids, then soup and crackers, and gradually work up to solid foods.   3) Please notify your doctor immediately if you have any unusual bleeding, trouble breathing, redness and pain at the surgery site, drainage, fever, or pain not relieved by medication.    4) Additional  Instructions:        Please contact your physician with any problems or Same Day Surgery at [phone], Monday through Friday 6 am to 4 pm, or [place] at [facility] number at [phone].

## 2018-06-15 NOTE — Anesthesia Postprocedure Evaluation (Signed)
Anesthesia Post Note  Patient: [name]  Procedure(s) Performed: DILATATION AND CURETTAGE /HYSTEROSCOPY (N/A Vagina )  Patient location during evaluation: PACU Anesthesia Type: General Level of consciousness: awake and alert Pain management: pain level controlled Vital Signs Assessment: post-procedure vital signs reviewed and stable Respiratory status: spontaneous breathing, nonlabored ventilation, respiratory function stable and patient connected to nasal cannula oxygen Cardiovascular status: blood pressure returned to baseline and stable Postop Assessment: no apparent nausea or vomiting Anesthetic complications: no     Last Vitals:  Vitals:   06/15/18 1622 06/15/18 1638  BP: (!) 156/76 (!) 156/76  Pulse: 66 74  Resp: 14 14  Temp: (!) 36.1 C (!) 36.3 C  SpO2: 96% 96%    Last Pain:  Vitals:   06/15/18 1638  TempSrc: Temporal  PainSc: 0-No pain                 Precious Haws Piscitello

## 2018-06-15 NOTE — Anesthesia Preprocedure Evaluation (Signed)
Anesthesia Evaluation  Patient identified by MRN, date of birth, ID band Patient awake    Reviewed: Allergy & Precautions, H&P , NPO status , reviewed documented beta blocker date and time   Airway Mallampati: II  TM Distance: >3 FB Neck ROM: full    Dental  (+) Chipped   Pulmonary Current Smoker,    Pulmonary exam normal        Cardiovascular hypertension, Normal cardiovascular exam     Neuro/Psych PSYCHIATRIC DISORDERS Anxiety    GI/Hepatic GERD  Medicated and Controlled,  Endo/Other    Renal/GU      Musculoskeletal  (+) Arthritis ,   Abdominal   Peds  Hematology   Anesthesia Other Findings Past Medical History: No date: Anxiety No date: Arthritis     Comment:  FINGERS No date: GERD (gastroesophageal reflux disease) No date: Hyperlipidemia No date: Hypertension 2014; 10/202018; 05/14/18: Postmenopausal bleeding     Comment:  endometrial bx done 09/2015 - nl; nl paps  Past Surgical History: No date: TUBAL LIGATION No date: WISDOM TOOTH EXTRACTION  BMI    Body Mass Index:  26.22 kg/m      Reproductive/Obstetrics                             Anesthesia Physical Anesthesia Plan  ASA: II  Anesthesia Plan: General   Post-op Pain Management:    Induction: Intravenous  PONV Risk Score and Plan: Treatment may vary due to age or medical condition and TIVA  Airway Management Planned: LMA  Additional Equipment:   Intra-op Plan:   Post-operative Plan: Extubation in OR  Informed Consent: I have reviewed the patients History and Physical, chart, labs and discussed the procedure including the risks, benefits and alternatives for the proposed anesthesia with the patient or authorized representative who has indicated his/her understanding and acceptance.   Dental Advisory Given  Plan Discussed with: CRNA  Anesthesia Plan Comments:         Anesthesia Quick Evaluation

## 2018-06-15 NOTE — Anesthesia Post-op Follow-up Note (Signed)
Anesthesia QCDR form completed.        

## 2018-06-15 NOTE — Transfer of Care (Signed)
Immediate Anesthesia Transfer of Care Note  Patient: Valerie Donovan  Procedure(s) Performed: DILATATION AND CURETTAGE /HYSTEROSCOPY (N/A Vagina )  Patient Location: PACU  Anesthesia Type:General  Level of Consciousness: sedated  Airway & Oxygen Therapy: Patient Spontanous Breathing and Patient connected to face mask oxygen  Post-op Assessment: Report given to RN and Post -op Vital signs reviewed and stable  Post vital signs: Reviewed and stable  Last Vitals:  Vitals Value Taken Time  BP 147/66 06/15/2018  3:41 PM  Temp    Pulse 62 06/15/2018  3:40 PM  Resp 11 06/15/2018  3:41 PM  SpO2 100 % 06/15/2018  3:41 PM  Vitals shown include unvalidated device data.  Last Pain:  Vitals:   06/15/18 1333  TempSrc: Temporal  PainSc: 0-No pain         Complications: No apparent anesthesia complications

## 2018-06-15 NOTE — Anesthesia Procedure Notes (Signed)
Procedure Name: LMA Insertion Performed by: Tyreak Reagle, CRNA Pre-anesthesia Checklist: Patient identified, Patient being monitored, Timeout performed, Emergency Drugs available and Suction available Patient Re-evaluated:Patient Re-evaluated prior to induction Oxygen Delivery Method: Circle system utilized Preoxygenation: Pre-oxygenation with 100% oxygen Induction Type: IV induction Ventilation: Mask ventilation without difficulty LMA: LMA inserted LMA Size: 3.0 Tube type: Oral Number of attempts: 1 Placement Confirmation: positive ETCO2 and breath sounds checked- equal and bilateral Tube secured with: Tape Dental Injury: Teeth and Oropharynx as per pre-operative assessment        

## 2018-06-15 NOTE — Op Note (Signed)
Operative Note  06/15/2018  PRE-OP DIAGNOSIS: Thickened endometrium  POST-OP DIAGNOSIS: uterine polyp  SURGEON: Nahsir Venezia MD  PROCEDURE: Procedure(s): DILATATION AND CURETTAGE /HYSTEROSCOPY   ANESTHESIA: Choice   ESTIMATED BLOOD LOSS: less than 10 cc   SPECIMENS:  Endometrial curetting's and uterine polyp  FLUID DEFICIT: None  COMPLICATIONS: None  DISPOSITION: PACU - hemodynamically stable.  CONDITION: stable  FINDINGS: Exam under anesthesia revealed small, mobile 8 cm uterus with no masses and bilateral adnexa without masses or fullness. Hysteroscopy revealed a large uterine polyp. After removal of the polyp the uterine cavity was grossly normal.  Bilateral tubal ostia seen. Normal appearing endocervical canal.  PROCEDURE IN DETAIL: After informed consent was obtained, the patient was taken to the operating room where anesthesia was obtained without difficulty. The patient was positioned in the dorsal lithotomy position in [place]. The patient's bladder was catheterized with an in and out foley catheter. The patient was examined under anesthesia, with the above noted findings. The graves speculum was placed inside the patient's vagina, and the the anterior lip of the cervix was seen and grasped with the tenaculum.  The uterine cavity was sounded to 8 cm, and then the cervix was progressively dilated to a 18 French-Pratt dilator. The 0 degree hysteroscope was introduced, with saline fluid used to distend the intrauterine cavity, with the above noted findings.  The hystersocope was removed. The polyp forceps were used to grasp, twist and remove the uterine polyp. The uterine cavity was curetted until a gritty texture was noted, yielding endometrial curettings. Hysteroscopy was repeated and the uterine cavity was clear with bilateral tubal ostia seen.  Excellent hemostasis was noted, and all instruments were removed, with excellent hemostasis noted throughout. She was then  taken out of dorsal lithotomy. Minimal discrepancy in fluid was noted.  The patient tolerated the procedure well. Sponge, lap and needle counts were correct x2. The patient was taken to recovery room in excellent condition.  Adrian Prows MD [facility] 06/15/18 3:53 PM

## 2018-06-15 NOTE — Interval H&P Note (Signed)
History and Physical Interval Note:  06/15/2018 2:24 PM  Valerie Donovan  has presented today for surgery, with the diagnosis of postmenopausal bleeding  The various methods of treatment have been discussed with the patient and family. After consideration of risks, benefits and other options for treatment, the patient has consented to  Procedure(s): DILATATION AND CURETTAGE /HYSTEROSCOPY (N/A) as a surgical intervention .  The patient's history has been reviewed, patient examined, no change in status, stable for surgery.  I have reviewed the patient's chart and labs.  Questions were answered to the patient's satisfaction.     [place]

## 2018-06-16 ENCOUNTER — Encounter: Payer: Self-pay | Admitting: Obstetrics and Gynecology

## 2018-06-19 LAB — SURGICAL PATHOLOGY

## 2018-06-21 ENCOUNTER — Other Ambulatory Visit: Payer: Self-pay

## 2018-06-21 ENCOUNTER — Encounter: Payer: Self-pay | Admitting: Unknown Physician Specialty

## 2018-06-21 ENCOUNTER — Ambulatory Visit: Payer: BLUE CROSS/BLUE SHIELD | Admitting: Unknown Physician Specialty

## 2018-06-21 DIAGNOSIS — F419 Anxiety disorder, unspecified: Secondary | ICD-10-CM

## 2018-06-21 DIAGNOSIS — E7800 Pure hypercholesterolemia, unspecified: Secondary | ICD-10-CM | POA: Diagnosis not present

## 2018-06-21 DIAGNOSIS — I10 Essential (primary) hypertension: Secondary | ICD-10-CM | POA: Diagnosis not present

## 2018-06-21 MED ORDER — FLUOXETINE HCL 20 MG PO CAPS: 20.0000 mg | ORAL_CAPSULE | Freq: Two times a day (BID) | ORAL | 1 refills | Status: DC

## 2018-06-21 MED ORDER — OMEPRAZOLE 20 MG PO CPDR: 20.0000 mg | DELAYED_RELEASE_CAPSULE | Freq: Every day | ORAL | 1 refills | Status: DC | PRN

## 2018-06-21 MED ORDER — ATORVASTATIN CALCIUM 20 MG PO TABS: 20.0000 mg | ORAL_TABLET | ORAL | 1 refills | Status: DC

## 2018-06-21 MED ORDER — BUSPIRONE HCL 15 MG PO TABS: 15.0000 mg | ORAL_TABLET | ORAL | 1 refills | Status: DC

## 2018-06-21 NOTE — Assessment & Plan Note (Signed)
Tolerating medication. Will recheck labs at physical

## 2018-06-21 NOTE — Assessment & Plan Note (Signed)
Stable with home numbers, continue present medications.

## 2018-06-21 NOTE — Assessment & Plan Note (Signed)
Stable, continue present medications.   

## 2018-06-21 NOTE — Progress Notes (Signed)
BP 126/70   Pulse (!) 56   Temp 98.2 F (36.8 C) (Oral)   Ht 5\' 3"  (1.6 m)   Wt 147 lb (66.7 kg)   LMP  (LMP Unknown)   SpO2 97%   BMI 26.04 kg/m    Subjective:    Patient ID: Valerie Donovan, female    DOB: November 04, 1956, 62 y.o.   MRN: [id number]  HPI: Valerie Donovan is a 62 y.o. female  Chief Complaint  Patient presents with  . Follow-up    Pt states she had a uterus surgery last thursday  . Medication Refill   Pt had a D&C recently.  She was seen at [facility] and saw Dr. Nechama Guard.  Had a good experience.    Hypertension Using medications without difficulty Average home BPs SBP is typically 120s at home   No problems or lightheadedness No chest pain with exertion or shortness of breath No Edema  Hyperlipidemia Using medications without problems: No Muscle aches  Diet compliance:Exercise:Eating lots of vegetables and working in the garden  Depression Doing well.  Able to cut back on Buspar to one a day.  Doing well with Fluoxetine Depression screen [facility] 2/9 06/21/2018 11/11/2017  Decreased Interest 0 3  Down, Depressed, Hopeless 0 0  PHQ - 2 Score 0 3  Altered sleeping 0 0  Tired, decreased energy 0 3  Change in appetite 0 0  Feeling bad or failure about yourself  0 0  Trouble concentrating 0 0  Moving slowly or fidgety/restless 0 0  Suicidal thoughts 0 0  PHQ-9 Score 0 6  Difficult doing work/chores Not difficult at all -    GERD Doesn't take it daily but would like a refill  Reviewed labs from 11/2017 and all were normal.    Social History   Socioeconomic History  . Marital status: Married    Spouse name: Not on file  . Number of children: Not on file  . Years of education: Not on file  . Highest education level: Not on file  Occupational History  . Not on file  Social Needs  . Financial resource strain: Not on file  . Food insecurity:    Worry: Not on file    Inability: Not on file  . Transportation needs:    Medical: Not on file   Non-medical: Not on file  Tobacco Use  . Smoking status: Current Every Day Smoker    Packs/day: 0.50    Years: 40.00    Pack years: 20.00    Types: Cigarettes  . Smokeless tobacco: Never Used  Substance and Sexual Activity  . Alcohol use: No    Frequency: Never  . Drug use: No    Comment: CBD OIL IN JUUL  . Sexual activity: Not Currently    Birth control/protection: Post-menopausal  Lifestyle  . Physical activity:    Days per week: Not on file    Minutes per session: Not on file  . Stress: Not on file  Relationships  . Social connections:    Talks on phone: Not on file    Gets together: Not on file    Attends religious service: Not on file    Active member of club or organization: Not on file    Attends meetings of clubs or organizations: Not on file    Relationship status: Not on file  . Intimate partner violence:    Fear of current or ex partner: Not on file    Emotionally abused: Not  on file    Physically abused: Not on file    Forced sexual activity: Not on file  Other Topics Concern  . Not on file  Social History Narrative  . Not on file    Relevant past medical, surgical, family and social history reviewed and updated as indicated. Interim medical history since our last visit reviewed. Allergies and medications reviewed and updated.  Review of Systems  Per HPI unless specifically indicated above     Objective:    BP 126/70   Pulse (!) 56   Temp 98.2 F (36.8 C) (Oral)   Ht 5\' 3"  (1.6 m)   Wt 147 lb (66.7 kg)   LMP  (LMP Unknown)   SpO2 97%   BMI 26.04 kg/m   Wt Readings from Last 3 Encounters:  06/21/18 147 lb (66.7 kg)  06/15/18 148 lb (67.1 kg)  05/29/18 148 lb (67.1 kg)    Physical Exam  Constitutional: She is oriented to person, place, and time. She appears well-developed and well-nourished. No distress.  HENT:  Head: Normocephalic and atraumatic.  Eyes: Conjunctivae and lids are normal. Right eye exhibits no discharge. Left eye exhibits  no discharge. No scleral icterus.  Neck: Normal range of motion. Neck supple. No JVD present. Carotid bruit is not present.  Cardiovascular: Normal rate, regular rhythm and normal heart sounds.  Pulmonary/Chest: Effort normal and breath sounds normal.  Abdominal: Normal appearance. There is no splenomegaly or hepatomegaly.  Musculoskeletal: Normal range of motion.  Neurological: She is alert and oriented to person, place, and time.  Skin: Skin is warm, dry and intact. No rash noted. No pallor.  Psychiatric: She has a normal mood and affect. Her behavior is normal. Judgment and thought content normal.      Assessment & Plan:   Problem List Items Addressed This Visit      Unprioritized   Anxiety    Stable, continue present medications.        Hyperlipidemia, unspecified    Tolerating medication. Will recheck labs at physical      Hypertension    Stable with home numbers, continue present medications.            Follow up plan: Return in about 5 months (around 11/21/2018) for physical.

## 2018-06-22 ENCOUNTER — Telehealth: Payer: Self-pay | Admitting: *Deleted

## 2018-06-22 ENCOUNTER — Ambulatory Visit (INDEPENDENT_AMBULATORY_CARE_PROVIDER_SITE_OTHER): Payer: BLUE CROSS/BLUE SHIELD | Admitting: Obstetrics and Gynecology

## 2018-06-22 ENCOUNTER — Encounter: Payer: Self-pay | Admitting: Obstetrics and Gynecology

## 2018-06-22 VITALS — BP 122/76 | HR 74 | Ht 63.0 in | Wt 148.0 lb

## 2018-06-22 DIAGNOSIS — N840 Polyp of corpus uteri: Secondary | ICD-10-CM

## 2018-06-22 DIAGNOSIS — Z9889 Other specified postprocedural states: Secondary | ICD-10-CM

## 2018-06-22 DIAGNOSIS — N950 Postmenopausal bleeding: Secondary | ICD-10-CM

## 2018-06-22 NOTE — Progress Notes (Signed)
  Postoperative Follow-up Patient presents post op from operative hysteroscopy for postmenopausal bleeding, 1 week ago.  Subjective: Patient reports marked improvement in her preop symptoms. Eating a regular diet without difficulty. The patient is not having any pain.  Activity: normal activities of daily living. Patient reports vaginal sx's of None  Objective: BP 122/76 (BP Location: Left Arm, Patient Position: Sitting, Cuff Size: Normal)   Pulse 74   Ht 5\' 3"  (1.6 m)   Wt 148 lb (67.1 kg)   LMP  (LMP Unknown)   BMI 26.22 kg/m  Physical Exam  Constitutional: She is oriented to person, place, and time. She appears well-developed.  Genitourinary: Vagina normal and uterus normal. There is no lesion on the right labia. There is no lesion on the left labia. Vagina exhibits no lesion. Right adnexum does not display mass. Left adnexum does not display mass. Cervix does not exhibit motion tenderness.  HENT:  Head: Normocephalic and atraumatic.  Eyes: EOM are normal.  Neck: Neck supple. No thyromegaly present.  Cardiovascular: Normal rate, regular rhythm and normal heart sounds.  Pulmonary/Chest: Effort normal and breath sounds normal. Right breast exhibits no inverted nipple, no mass, no nipple discharge and no skin change. Left breast exhibits no inverted nipple, no mass, no nipple discharge and no skin change.  Abdominal: Soft. Bowel sounds are normal. She exhibits no distension and no mass.  Neurological: She is alert and oriented to person, place, and time.  Skin: Skin is warm and dry.  Psychiatric: She has a normal mood and affect. Her behavior is normal. Judgment and thought content normal.  Vitals reviewed.   Assessment: s/p :  operative hysteroscopy stable  Plan: Patient has done well after surgery with no apparent complications.  I have discussed the post-operative course to date, and the expected progress moving forward.  The patient understands what complications to be concerned  about.  I will see the patient in routine follow up, or sooner if needed.    Normal pathology of uterine polyp.  Activity plan: No restriction.  Christanna R Schuman 06/22/2018, 11:29 AM

## 2018-06-22 NOTE — Progress Notes (Signed)
Negative, Released to mychart 

## 2018-06-22 NOTE — Telephone Encounter (Signed)
Contacted patient in attempt to schedule lung screening scan patient was referred for. Patient requests to call me back to schedule at a later date.

## 2018-08-11 ENCOUNTER — Telehealth: Payer: Self-pay | Admitting: *Deleted

## 2018-08-11 ENCOUNTER — Encounter: Payer: Self-pay | Admitting: *Deleted

## 2018-08-11 DIAGNOSIS — Z122 Encounter for screening for malignant neoplasm of respiratory organs: Secondary | ICD-10-CM

## 2018-08-11 NOTE — Telephone Encounter (Signed)
Received referral for low dose lung cancer screening CT scan.  Message left at phone number listed in EMR for patient to call either myself or Shawn Perkins back at [phone] to facilitate scheduling the scan.    

## 2018-08-11 NOTE — Telephone Encounter (Signed)
Received a referral for initial lung cancer screening scan.  Contacted the patient and obtained their smoking history, currently smoking 0.5-0.75 ppd with 33 pkyr history   as well as answering questions related to screening process.  Patient denies signs of lung cancer such as weight loss or hemoptysis at this time.  Patient denies comorbidity that would prevent curative treatment if lung cancer were found.  Patient is scheduled for the Shared Decision Making Visit and CT scan on 08-31-18 @1345  .

## 2018-08-19 ENCOUNTER — Other Ambulatory Visit: Payer: Self-pay | Admitting: Family Medicine

## 2018-08-31 ENCOUNTER — Inpatient Hospital Stay: Payer: Self-pay | Attending: Nurse Practitioner | Admitting: Nurse Practitioner

## 2018-08-31 ENCOUNTER — Ambulatory Visit: Payer: BLUE CROSS/BLUE SHIELD

## 2018-09-05 ENCOUNTER — Telehealth: Payer: Self-pay | Admitting: *Deleted

## 2018-09-05 NOTE — Telephone Encounter (Signed)
Patient reports previously attempting to cancel screening appointment. Patient desires to wait until January for lung screening due to insurance issues. She is aware that we have charitable funds that can assist.

## 2018-09-05 NOTE — Telephone Encounter (Signed)
Received referral for low dose lung cancer screening CT scan. Message left at phone number listed in EMR for patient to call me back to facilitate scheduling scan. Note, patient has had prior no show appointment.

## 2018-09-14 ENCOUNTER — Other Ambulatory Visit: Payer: Self-pay | Admitting: Unknown Physician Specialty

## 2018-09-14 NOTE — Telephone Encounter (Signed)
Requested Prescriptions  Pending Prescriptions Disp Refills  . FLUoxetine (PROZAC) 20 MG capsule [Pharmacy Med Name: FLUOXETINE 20MG  CAPSULES] 90 capsule 0    Sig: TAKE 1 CAPSULE(20 MG) BY MOUTH TWICE DAILY     Psychiatry:  Antidepressants - SSRI Passed - 09/14/2018  6:13 AM      Passed - Valid encounter within last 6 months    Recent Outpatient Visits          2 months ago Essential hypertension   [facility] Kathrine Haddock, NP   6 months ago Acute otitis externa of left ear, unspecified type   [facility] [place], [place]   6 months ago Acute noninfective otitis externa of left ear, unspecified type   [place], [place]   8 months ago Fatigue, unspecified type   [facility] Kathrine Haddock, NP   10 months ago Herpes zoster without complication   [facility] Kathrine Haddock, NP      Future Appointments            In 66 month Orene Desanctis, Lilia Argue, [place], [place]

## 2018-10-14 ENCOUNTER — Other Ambulatory Visit: Payer: Self-pay | Admitting: Unknown Physician Specialty

## 2018-10-16 NOTE — Telephone Encounter (Signed)
LR 06/21/18 for 90 tabs and 1 refill Requested Prescriptions  Refused Prescriptions Disp Refills  . atorvastatin (LIPITOR) 20 MG tablet [Pharmacy Med Name: ATORVASTATIN 20MG  TABLETS] 90 tablet 0    Sig: TAKE 1 TABLET(20 MG) BY MOUTH EVERY MORNING     Cardiovascular:  Antilipid - Statins Passed - 10/14/2018  6:16 AM      Passed - Total Cholesterol in normal range and within 360 days    Cholesterol, Total  Date Value Ref Range Status  11/11/2017 138 100 - 199 mg/dL Final         Passed - LDL in normal range and within 360 days    LDL Calculated  Date Value Ref Range Status  11/11/2017 77 0 - 99 mg/dL Final         Passed - HDL in normal range and within 360 days    HDL  Date Value Ref Range Status  11/11/2017 41 >39 mg/dL Final         Passed - Triglycerides in normal range and within 360 days    Triglycerides  Date Value Ref Range Status  11/11/2017 102 0 - 149 mg/dL Final         Passed - Patient is not pregnant      Passed - Valid encounter within last 12 months    Recent Outpatient Visits          3 months ago Essential hypertension   [facility] Kathrine Haddock, NP   7 months ago Acute otitis externa of left ear, unspecified type   [facility] [place], [place]   7 months ago Acute noninfective otitis externa of left ear, unspecified type   [facility], [place], [place]   9 months ago Fatigue, unspecified type   [facility] Kathrine Haddock, NP   11 months ago Herpes zoster without complication   [facility] Kathrine Haddock, NP      Future Appointments            In 3 weeks Orene Desanctis, Lilia Argue, PA-C [facility], [facility]

## 2018-11-09 ENCOUNTER — Encounter: Payer: BLUE CROSS/BLUE SHIELD | Admitting: Family Medicine

## 2018-11-30 ENCOUNTER — Telehealth: Payer: Self-pay | Admitting: *Deleted

## 2018-11-30 NOTE — Telephone Encounter (Signed)
Received referral for low dose lung cancer screening CT scan. Message left at phone number listed in EMR for patient to call me back to facilitate scheduling scan.  

## 2018-12-04 ENCOUNTER — Telehealth: Payer: Self-pay | Admitting: *Deleted

## 2018-12-04 NOTE — Telephone Encounter (Signed)
Received referral for low dose lung cancer screening CT scan. Message left at phone number listed in EMR for patient to call me back to facilitate scheduling scan.  

## 2018-12-13 ENCOUNTER — Telehealth: Payer: Self-pay | Admitting: *Deleted

## 2018-12-13 ENCOUNTER — Other Ambulatory Visit: Payer: Self-pay | Admitting: Family Medicine

## 2018-12-13 NOTE — Telephone Encounter (Signed)
Received referral for low dose lung cancer screening CT scan.  Message left at phone number listed in EMR for patient to call either myself or Shawn Perkins back at [phone] to facilitate scheduling the scan.    

## 2018-12-20 ENCOUNTER — Other Ambulatory Visit: Payer: Self-pay | Admitting: *Deleted

## 2018-12-20 ENCOUNTER — Telehealth: Payer: Self-pay | Admitting: *Deleted

## 2018-12-20 MED ORDER — FLUOXETINE HCL 20 MG PO CAPS: ORAL_CAPSULE | ORAL | 0 refills | Status: DC

## 2018-12-20 NOTE — Telephone Encounter (Signed)
Received referral for low dose lung cancer screening CT scan. Message left at phone number listed in EMR for patient to call me back to facilitate scheduling scan.  

## 2018-12-21 ENCOUNTER — Encounter: Payer: Self-pay | Admitting: *Deleted

## 2018-12-27 ENCOUNTER — Encounter: Payer: Self-pay | Admitting: Family Medicine

## 2018-12-27 ENCOUNTER — Ambulatory Visit (INDEPENDENT_AMBULATORY_CARE_PROVIDER_SITE_OTHER): Payer: PRIVATE HEALTH INSURANCE | Admitting: Family Medicine

## 2018-12-27 VITALS — BP 160/81 | HR 63 | Temp 97.9°F | Ht 64.17 in | Wt 146.0 lb

## 2018-12-27 DIAGNOSIS — I10 Essential (primary) hypertension: Secondary | ICD-10-CM | POA: Diagnosis not present

## 2018-12-27 DIAGNOSIS — E7800 Pure hypercholesterolemia, unspecified: Secondary | ICD-10-CM | POA: Diagnosis not present

## 2018-12-27 DIAGNOSIS — F419 Anxiety disorder, unspecified: Secondary | ICD-10-CM

## 2018-12-27 DIAGNOSIS — Z0000 Encounter for general adult medical examination without abnormal findings: Secondary | ICD-10-CM

## 2018-12-27 LAB — MICROSCOPIC EXAMINATION

## 2018-12-27 LAB — UA/M W/RFLX CULTURE, ROUTINE
Bilirubin, UA: NEGATIVE
Glucose, UA: NEGATIVE
Ketones, UA: NEGATIVE
Leukocytes, UA: NEGATIVE
Nitrite, UA: NEGATIVE
PH UA: 5.5 (ref 5.0–7.5)
Protein, UA: NEGATIVE
Specific Gravity, UA: 1.01 (ref 1.005–1.030)
Urobilinogen, Ur: 0.2 mg/dL (ref 0.2–1.0)

## 2018-12-27 MED ORDER — FLUOXETINE HCL 40 MG PO CAPS: 40.0000 mg | ORAL_CAPSULE | Freq: Every day | ORAL | 3 refills | Status: DC

## 2018-12-27 MED ORDER — ATORVASTATIN CALCIUM 20 MG PO TABS: 20.0000 mg | ORAL_TABLET | ORAL | 1 refills | Status: DC

## 2018-12-27 MED ORDER — LISINOPRIL 20 MG PO TABS: 20.0000 mg | ORAL_TABLET | Freq: Every day | ORAL | 0 refills | Status: DC

## 2018-12-27 MED ORDER — QUETIAPINE FUMARATE 50 MG PO TABS: 25.0000 mg | ORAL_TABLET | Freq: Every day | ORAL | 0 refills | Status: DC

## 2018-12-27 MED ORDER — OMEPRAZOLE 20 MG PO CPDR: 20.0000 mg | DELAYED_RELEASE_CAPSULE | Freq: Every day | ORAL | 1 refills | Status: DC | PRN

## 2018-12-27 NOTE — Progress Notes (Signed)
BP (!) 160/81   Pulse 63   Temp 97.9 F (36.6 C) (Oral)   Ht 5' 4.17" (1.63 m)   Wt 146 lb (66.2 kg)   LMP  (LMP Unknown)   SpO2 98%   BMI 24.93 kg/m    Subjective:    Patient ID: Valerie Donovan, female    DOB: [birth year], 63 y.o.   MRN: [id number]  HPI: Valerie Donovan is a 63 y.o. female presenting on 12/27/2018 for comprehensive medical examination. Current medical complaints include:see below  Checks BPs at home occasionally, sometimes elevated but does note that she has lots of emotional stress lately. States when at home she's getting 140s/70s when calm.   Having significant issues with depression and anxiety right now. Has tried counseling in the past which helped for a while but she stopped thinking she was doing better. States she talks to her sisters regularly which helps.   Last colonoscopy was 5 years ago. Father passed away from colon cancer so she is supposed to go every 5 years. Wants to wait until medicare kicks in.   She currently lives with: Menopausal Symptoms: no  Depression Screen done today and results listed below:  Depression screen [facility] 2/9 12/27/2018 06/21/2018 11/11/2017  Decreased Interest 1 0 3  Down, Depressed, Hopeless 1 0 0  PHQ - 2 Score 2 0 3  Altered sleeping 0 0 0  Tired, decreased energy 0 0 3  Change in appetite 0 0 0  Feeling bad or failure about yourself  1 0 0  Trouble concentrating 0 0 0  Moving slowly or fidgety/restless 0 0 0  Suicidal thoughts 0 0 0  PHQ-9 Score 3 0 6  Difficult doing work/chores - Not difficult at all -    The patient does not have a history of falls. I did complete a risk assessment for falls. A plan of care for falls was documented.   Past Medical History:  Past Medical History:  Diagnosis Date  . Anxiety   . Arthritis    FINGERS  . GERD (gastroesophageal reflux disease)   . Hyperlipidemia   . Hypertension   . Postmenopausal bleeding 2014; 10/202018; 05/14/18   endometrial bx done 09/2015 - nl; nl  paps    Surgical History:  Past Surgical History:  Procedure Laterality Date  . HYSTEROSCOPY W/D&C N/A 06/15/2018   Procedure: DILATATION AND CURETTAGE /HYSTEROSCOPY;  Surgeon: Homero Fellers, MD;  Location: [facility] ORS;  Service: Gynecology;  Laterality: N/A;  . TUBAL LIGATION    . WISDOM TOOTH EXTRACTION      Medications:  No current outpatient medications on file prior to visit.   No current facility-administered medications on file prior to visit.     Allergies:  Allergies  Allergen Reactions  . Ciprofloxacin Anaphylaxis  . Clarithromycin Anaphylaxis  . Other Anaphylaxis    ANTIBIOTICS-PT CAN TOLERATE Z-PAC AND DOXYCYCLINE BUT ALL OTHERS SHE CANNOT  . Penicillin G Anaphylaxis    Has patient had a PCN reaction causing immediate rash, facial/tongue/throat swelling, SOB or lightheadedness with hypotension: Yes Has patient had a PCN reaction causing severe rash involving mucus membranes or skin necrosis: No Has patient had a PCN reaction that required hospitalization: No Has patient had a PCN reaction occurring within the last 10 years: No If all of the above answers are "NO", then may proceed with Cephalosporin use.   . Codeine Itching    vomiting    Social History:  Social History   Socioeconomic  History  . Marital status: Divorced    Spouse name: Not on file  . Number of children: Not on file  . Years of education: Not on file  . Highest education level: Not on file  Occupational History  . Not on file  Social Needs  . Financial resource strain: Not on file  . Food insecurity:    Worry: Not on file    Inability: Not on file  . Transportation needs:    Medical: Not on file    Non-medical: Not on file  Tobacco Use  . Smoking status: Current Every Day Smoker    Packs/day: 0.75    Years: 44.00    Pack years: 33.00    Types: Cigarettes  . Smokeless tobacco: Never Used  Substance and Sexual Activity  . Alcohol use: No    Frequency: Never  . Drug use:  No    Comment: CBD OIL IN JUUL  . Sexual activity: Not Currently    Birth control/protection: Post-menopausal  Lifestyle  . Physical activity:    Days per week: Not on file    Minutes per session: Not on file  . Stress: Not on file  Relationships  . Social connections:    Talks on phone: Not on file    Gets together: Not on file    Attends religious service: Not on file    Active member of club or organization: Not on file    Attends meetings of clubs or organizations: Not on file    Relationship status: Not on file  . Intimate partner violence:    Fear of current or ex partner: Not on file    Emotionally abused: Not on file    Physically abused: Not on file    Forced sexual activity: Not on file  Other Topics Concern  . Not on file  Social History Narrative  . Not on file   Social History   Tobacco Use  Smoking Status Current Every Day Smoker  . Packs/day: 0.75  . Years: 44.00  . Pack years: 33.00  . Types: Cigarettes  Smokeless Tobacco Never Used   Social History   Substance and Sexual Activity  Alcohol Use No  . Frequency: Never    Family History:  Family History  Problem Relation Age of Onset  . Cancer Mother   . Diabetes Mother   . Cancer Father        colon  . Diabetes Sister   . Diabetes Maternal Grandmother   . Diabetes Maternal Grandfather   . Diabetes Sister     Past medical history, surgical history, medications, allergies, family history and social history reviewed with patient today and changes made to appropriate areas of the chart.   Review of Systems - General ROS: negative Psychological ROS: positive for - anxiety and depression Ophthalmic ROS: negative ENT ROS: negative Allergy and Immunology ROS: negative Hematological and Lymphatic ROS: negative Endocrine ROS: negative Breast ROS: negative for breast lumps Respiratory ROS: no cough, shortness of breath, or wheezing Cardiovascular ROS: no chest pain or dyspnea on  exertion Gastrointestinal ROS: no abdominal pain, change in bowel habits, or black or bloody stools Genito-Urinary ROS: no dysuria, trouble voiding, or hematuria Musculoskeletal ROS: negative Neurological ROS: no TIA or stroke symptoms Dermatological ROS: negative All other ROS negative except what is listed above and in the HPI.      Objective:    BP (!) 160/81   Pulse 63   Temp 97.9 F (36.6 C) (Oral)  Ht 5' 4.17" (1.63 m)   Wt 146 lb (66.2 kg)   LMP  (LMP Unknown)   SpO2 98%   BMI 24.93 kg/m   Wt Readings from Last 3 Encounters:  12/27/18 146 lb (66.2 kg)  06/22/18 148 lb (67.1 kg)  06/21/18 147 lb (66.7 kg)    Physical Exam Vitals signs and nursing note reviewed.  Constitutional:      General: She is not in acute distress.    Appearance: She is well-developed.  HENT:     Head: Atraumatic.     Right Ear: External ear normal.     Left Ear: External ear normal.     Nose: Nose normal.     Mouth/Throat:     Pharynx: No oropharyngeal exudate.  Eyes:     General: No scleral icterus.    Conjunctiva/sclera: Conjunctivae normal.     Pupils: Pupils are equal, round, and reactive to light.  Neck:     Musculoskeletal: Normal range of motion and neck supple.     Thyroid: No thyromegaly.  Cardiovascular:     Rate and Rhythm: Normal rate and regular rhythm.     Heart sounds: Normal heart sounds.  Pulmonary:     Effort: Pulmonary effort is normal. No respiratory distress.     Breath sounds: Normal breath sounds.  Chest:     Breasts:        Right: No mass, skin change or tenderness.        Left: No mass, skin change or tenderness.  Abdominal:     General: Bowel sounds are normal.     Palpations: Abdomen is soft. There is no mass.     Tenderness: There is no abdominal tenderness.  Genitourinary:    Comments: Exam declined Musculoskeletal: Normal range of motion.        General: No tenderness.  Lymphadenopathy:     Cervical: No cervical adenopathy.  Skin:     General: Skin is warm and dry.     Findings: No rash.  Neurological:     Mental Status: She is alert and oriented to person, place, and time.     Cranial Nerves: No cranial nerve deficit.  Psychiatric:        Thought Content: Thought content normal.        Judgment: Judgment normal.     Comments: Tearful during visit     Results for orders placed or performed in visit on 12/27/18  Microscopic Examination  Result Value Ref Range   WBC, UA 0-5 0 - 5 /hpf   RBC, UA 0-2 0 - 2 /hpf   Epithelial Cells (non renal) 0-10 0 - 10 /hpf   Bacteria, UA Few (A) None seen/Few  CBC with Differential/Platelet  Result Value Ref Range   WBC 9.2 3.4 - 10.8 x10E3/uL   RBC 4.52 3.77 - 5.28 x10E6/uL   Hemoglobin 14.5 11.1 - 15.9 g/dL   Hematocrit 42.9 34.0 - 46.6 %   MCV 95 79 - 97 fL   MCH 32.1 26.6 - 33.0 pg   MCHC 33.8 31.5 - 35.7 g/dL   RDW 11.8 11.7 - 15.4 %   Platelets 294 150 - 450 x10E3/uL   Neutrophils 63 Not Estab. %   Lymphs 27 Not Estab. %   Monocytes 7 Not Estab. %   Eos 2 Not Estab. %   Basos 0 Not Estab. %   Neutrophils Absolute 5.9 1.4 - 7.0 x10E3/uL   Lymphocytes Absolute 2.5 0.7 - 3.1 x10E3/uL  Monocytes Absolute 0.6 0.1 - 0.9 x10E3/uL   EOS (ABSOLUTE) 0.1 0.0 - 0.4 x10E3/uL   Basophils Absolute 0.0 0.0 - 0.2 x10E3/uL   Immature Granulocytes 1 Not Estab. %   Immature Grans (Abs) 0.1 0.0 - 0.1 x10E3/uL  Comprehensive metabolic panel  Result Value Ref Range   Glucose 86 65 - 99 mg/dL   BUN 8 8 - 27 mg/dL   Creatinine, Ser 0.74 0.57 - 1.00 mg/dL   GFR calc non Af Amer 86 >59 mL/min/1.73   GFR calc Af Amer 99 >59 mL/min/1.73   BUN/Creatinine Ratio 11 (L) 12 - 28   Sodium 143 134 - 144 mmol/L   Potassium 4.2 3.5 - 5.2 mmol/L   Chloride 103 96 - 106 mmol/L   CO2 24 20 - 29 mmol/L   Calcium 9.7 8.7 - 10.3 mg/dL   Total Protein 6.8 6.0 - 8.5 g/dL   Albumin 4.4 3.8 - 4.8 g/dL   Globulin, Total 2.4 1.5 - 4.5 g/dL   Albumin/Globulin Ratio 1.8 1.2 - 2.2   Bilirubin Total 0.3  0.0 - 1.2 mg/dL   Alkaline Phosphatase 118 (H) 39 - 117 IU/L   AST 17 0 - 40 IU/L   ALT 19 0 - 32 IU/L  Lipid Panel w/o Chol/HDL Ratio  Result Value Ref Range   Cholesterol, Total 153 100 - 199 mg/dL   Triglycerides 95 0 - 149 mg/dL   HDL 45 >39 mg/dL   VLDL Cholesterol Cal 19 5 - 40 mg/dL   LDL Calculated 89 0 - 99 mg/dL  UA/M w/rflx Culture, Routine  Result Value Ref Range   Specific Gravity, UA 1.010 1.005 - 1.030   pH, UA 5.5 5.0 - 7.5   Color, UA Yellow Yellow   Appearance Ur Clear Clear   Leukocytes, UA Negative Negative   Protein, UA Negative Negative/Trace   Glucose, UA Negative Negative   Ketones, UA Negative Negative   RBC, UA 2+ (A) Negative   Bilirubin, UA Negative Negative   Urobilinogen, Ur 0.2 0.2 - 1.0 mg/dL   Nitrite, UA Negative Negative   Microscopic Examination See below:       Assessment & Plan:   Problem List Items Addressed This Visit      Cardiovascular and Mediastinum   Hypertension - Primary    Persistently elevated lately, will increase to 20 mg lisinopril and continue to monitor home readings. Call with persistent high readings or low readings. DASH diet, exercise, stress control      Relevant Medications   atorvastatin (LIPITOR) 20 MG tablet   lisinopril (PRINIVIL,ZESTRIL) 20 MG tablet   Other Relevant Orders   CBC with Differential/Platelet (Completed)   Comprehensive metabolic panel (Completed)   UA/M w/rflx Culture, Routine (Completed)     Other   Hyperlipidemia, unspecified    Recheck lipids and adjust as needed      Relevant Medications   atorvastatin (LIPITOR) 20 MG tablet   lisinopril (PRINIVIL,ZESTRIL) 20 MG tablet   Other Relevant Orders   Lipid Panel w/o Chol/HDL Ratio (Completed)   Anxiety    Discussed risks of serotonin syndrome taking 2 SSRIs together. Continue prozac, start seroquel at bedtime. Counseling recommended, she declines and would like to just continue confiding in her sisters      Relevant Medications    FLUoxetine (PROZAC) 40 MG capsule    Other Visit Diagnoses    Annual physical exam           Follow up plan: Return in  about 4 weeks (around 01/24/2019) for Mood, BP f/u.   LABORATORY TESTING:  - Pap smear: up to date  IMMUNIZATIONS:   - Tdap: Tetanus vaccination status reviewed: last tetanus booster within 10 years. - Influenza: Refused - Pneumovax: Not applicable - Prevnar: Not applicable - HPV: Not applicable - Zostavax vaccine: Refused  SCREENING: -Mammogram: Up to date  - Colonoscopy: Patient is due, but wanting to hold off until her insurance changes later this year. She will call at that time   - Bone Density: Not applicable   PATIENT COUNSELING:   Advised to take 1 mg of folate supplement per day if capable of pregnancy.   Sexuality: Discussed sexually transmitted diseases, partner selection, use of condoms, avoidance of unintended pregnancy  and contraceptive alternatives.   Advised to avoid cigarette smoking.  I discussed with the patient that most people either abstain from alcohol or drink within safe limits (<=14/week and <=4 drinks/occasion for males, <=7/weeks and <= 3 drinks/occasion for females) and that the risk for alcohol disorders and other health effects rises proportionally with the number of drinks per week and how often a drinker exceeds daily limits.  Discussed cessation/primary prevention of drug use and availability of treatment for abuse.   Diet: Encouraged to adjust caloric intake to maintain  or achieve ideal body weight, to reduce intake of dietary saturated fat and total fat, to limit sodium intake by avoiding high sodium foods and not adding table salt, and to maintain adequate dietary potassium and calcium preferably from fresh fruits, vegetables, and low-fat dairy products.    stressed the importance of regular exercise  Injury prevention: Discussed safety belts, safety helmets, smoke detector, smoking near bedding or upholstery.   Dental  health: Discussed importance of regular tooth brushing, flossing, and dental visits.    NEXT PREVENTATIVE PHYSICAL DUE IN 1 YEAR. Return in about 4 weeks (around 01/24/2019) for Mood, BP f/u.

## 2018-12-28 LAB — CBC WITH DIFFERENTIAL/PLATELET
BASOS ABS: 0 10*3/uL (ref 0.0–0.2)
Basos: 0 %
EOS (ABSOLUTE): 0.1 10*3/uL (ref 0.0–0.4)
Eos: 2 %
Hematocrit: 42.9 % (ref 34.0–46.6)
Hemoglobin: 14.5 g/dL (ref 11.1–15.9)
IMMATURE GRANS (ABS): 0.1 10*3/uL (ref 0.0–0.1)
IMMATURE GRANULOCYTES: 1 %
Lymphocytes Absolute: 2.5 10*3/uL (ref 0.7–3.1)
Lymphs: 27 %
MCH: 32.1 pg (ref 26.6–33.0)
MCHC: 33.8 g/dL (ref 31.5–35.7)
MCV: 95 fL (ref 79–97)
MONOCYTES: 7 %
MONOS ABS: 0.6 10*3/uL (ref 0.1–0.9)
Neutrophils Absolute: 5.9 10*3/uL (ref 1.4–7.0)
Neutrophils: 63 %
PLATELETS: 294 10*3/uL (ref 150–450)
RBC: 4.52 x10E6/uL (ref 3.77–5.28)
RDW: 11.8 % (ref 11.7–15.4)
WBC: 9.2 10*3/uL (ref 3.4–10.8)

## 2018-12-28 LAB — COMPREHENSIVE METABOLIC PANEL
ALT: 19 IU/L (ref 0–32)
AST: 17 IU/L (ref 0–40)
Albumin/Globulin Ratio: 1.8 (ref 1.2–2.2)
Albumin: 4.4 g/dL (ref 3.8–4.8)
Alkaline Phosphatase: 118 IU/L — ABNORMAL HIGH (ref 39–117)
BUN/Creatinine Ratio: 11 — ABNORMAL LOW (ref 12–28)
BUN: 8 mg/dL (ref 8–27)
Bilirubin Total: 0.3 mg/dL (ref 0.0–1.2)
CO2: 24 mmol/L (ref 20–29)
Calcium: 9.7 mg/dL (ref 8.7–10.3)
Chloride: 103 mmol/L (ref 96–106)
Creatinine, Ser: 0.74 mg/dL (ref 0.57–1.00)
GFR calc Af Amer: 99 mL/min/{1.73_m2} (ref >59)
GFR calc non Af Amer: 86 mL/min/{1.73_m2} (ref >59)
GLUCOSE: 86 mg/dL (ref 65–99)
Globulin, Total: 2.4 g/dL (ref 1.5–4.5)
Potassium: 4.2 mmol/L (ref 3.5–5.2)
Sodium: 143 mmol/L (ref 134–144)
Total Protein: 6.8 g/dL (ref 6.0–8.5)

## 2018-12-28 LAB — LIPID PANEL W/O CHOL/HDL RATIO
CHOLESTEROL TOTAL: 153 mg/dL (ref 100–199)
HDL: 45 mg/dL (ref >39)
LDL Calculated: 89 mg/dL (ref 0–99)
TRIGLYCERIDES: 95 mg/dL (ref 0–149)
VLDL CHOLESTEROL CAL: 19 mg/dL (ref 5–40)

## 2018-12-31 NOTE — Assessment & Plan Note (Signed)
Recheck lipids and adjust as needed

## 2018-12-31 NOTE — Assessment & Plan Note (Signed)
Discussed risks of serotonin syndrome taking 2 SSRIs together. Continue prozac, start seroquel at bedtime. Counseling recommended, she declines and would like to just continue confiding in her sisters

## 2018-12-31 NOTE — Assessment & Plan Note (Signed)
Persistently elevated lately, will increase to 20 mg lisinopril and continue to monitor home readings. Call with persistent high readings or low readings. DASH diet, exercise, stress control

## 2019-01-01 ENCOUNTER — Ambulatory Visit: Payer: Self-pay | Admitting: *Deleted

## 2019-01-01 MED ORDER — HYDROXYZINE HCL 25 MG PO TABS: 25.0000 mg | ORAL_TABLET | Freq: Three times a day (TID) | ORAL | 1 refills | Status: DC | PRN

## 2019-01-01 NOTE — Telephone Encounter (Signed)
D/c seroquel, start hydroxyzine as needed for anxiety and at bedtime for sleep

## 2019-01-01 NOTE — Telephone Encounter (Signed)
Patient notified

## 2019-01-01 NOTE — Addendum Note (Signed)
Addended by: Merrie Roof E on: 01/01/2019 05:06 PM   Modules accepted: Orders

## 2019-01-01 NOTE — Telephone Encounter (Signed)
Contacted pt regarding symptoms; she says that her seroquel makes her dizzy; she also has nausea but she is not sure if this is due to the dizziness, or the medication itself; she states that she took her 1st dose (1/2 tablet) on the night of 12/27/2018; her last dose (1/2 tablet) was 12/31/2018; the pt says that she did not take the medication on 12/30/2018 and she felt fine; she would like another medication because this "knocks her out";  pt is  pt asking for an alternate medication if possible; she says that she is not having  hives/swelling/etc - does not think allergic reaction.; the pt uses [facility]; she can be contacted at [phone] and a message can be left on voicemail; will route to office for provider review and final disposition.   Reason for Disposition . Caller has URGENT medication question about med that PCP prescribed and triager unable to answer question  Answer Assessment - Initial Assessment Questions 1. SYMPTOMS: "Do you have any symptoms?"     Dizziness, nausea 2. SEVERITY: If symptoms are present, ask "Are they mild, moderate or severe?"     moderate  Protocols used: MEDICATION QUESTION CALL-A-AH

## 2019-01-12 ENCOUNTER — Other Ambulatory Visit: Payer: Self-pay | Admitting: Family Medicine

## 2019-01-26 ENCOUNTER — Ambulatory Visit: Payer: PRIVATE HEALTH INSURANCE | Admitting: Family Medicine

## 2019-01-29 ENCOUNTER — Other Ambulatory Visit: Payer: Self-pay | Admitting: Family Medicine

## 2019-06-28 ENCOUNTER — Telehealth: Payer: Self-pay | Admitting: Family Medicine

## 2019-06-28 NOTE — Telephone Encounter (Signed)
Medication Refill - Medication: atorvastatin (LIPITOR) 20 MG tablet and FLUoxetine (PROZAC) 40 MG capsule and lisinopril (PRINIVIL,ZESTRIL) 20 MG tablet  [id number] - [place], [place] [address] AT [facility] OF [address] Shari Prows [phone] (Phone) [phone] (Fax)

## 2019-06-29 MED ORDER — LISINOPRIL 20 MG PO TABS: ORAL_TABLET | ORAL | 0 refills | Status: DC

## 2019-06-29 MED ORDER — ATORVASTATIN CALCIUM 20 MG PO TABS: 20.0000 mg | ORAL_TABLET | ORAL | 0 refills | Status: DC

## 2019-06-29 NOTE — Telephone Encounter (Signed)
Please call back and schedule

## 2019-06-29 NOTE — Telephone Encounter (Signed)
Called pt back no answer left vm 

## 2019-06-29 NOTE — Telephone Encounter (Signed)
Called pt to schedule f/u, no answer, left vm

## 2019-06-29 NOTE — Telephone Encounter (Signed)
Patient is 6 months overdue for BP recheck and 1 month overdue for 6 month f/u. Will give 2 week courtesy supply but does need an appt ASAP for recheck. Can be virtual if needed and if she has access to BP monitor. Also, she should be good on prozac until next January so did not refill that one

## 2019-06-29 NOTE — Telephone Encounter (Signed)
Pt called back, tried office but no answer.

## 2019-07-04 ENCOUNTER — Other Ambulatory Visit: Payer: Self-pay | Admitting: Family Medicine

## 2019-07-06 ENCOUNTER — Encounter: Payer: Self-pay | Admitting: Family Medicine

## 2019-07-06 ENCOUNTER — Ambulatory Visit (INDEPENDENT_AMBULATORY_CARE_PROVIDER_SITE_OTHER): Payer: PPO | Admitting: Family Medicine

## 2019-07-06 ENCOUNTER — Other Ambulatory Visit: Payer: Self-pay

## 2019-07-06 VITALS — BP 136/76 | HR 65 | Temp 98.9°F | Ht 63.0 in | Wt 145.0 lb

## 2019-07-06 DIAGNOSIS — E7800 Pure hypercholesterolemia, unspecified: Secondary | ICD-10-CM

## 2019-07-06 DIAGNOSIS — E559 Vitamin D deficiency, unspecified: Secondary | ICD-10-CM

## 2019-07-06 DIAGNOSIS — I10 Essential (primary) hypertension: Secondary | ICD-10-CM

## 2019-07-06 DIAGNOSIS — E538 Deficiency of other specified B group vitamins: Secondary | ICD-10-CM

## 2019-07-06 DIAGNOSIS — Z1211 Encounter for screening for malignant neoplasm of colon: Secondary | ICD-10-CM

## 2019-07-06 DIAGNOSIS — Z1212 Encounter for screening for malignant neoplasm of rectum: Secondary | ICD-10-CM

## 2019-07-06 DIAGNOSIS — F419 Anxiety disorder, unspecified: Secondary | ICD-10-CM

## 2019-07-06 MED ORDER — ATORVASTATIN CALCIUM 20 MG PO TABS: 20.0000 mg | ORAL_TABLET | ORAL | 1 refills | Status: DC

## 2019-07-06 MED ORDER — OMEPRAZOLE 20 MG PO CPDR: 20.0000 mg | DELAYED_RELEASE_CAPSULE | Freq: Every day | ORAL | 1 refills | Status: DC | PRN

## 2019-07-06 MED ORDER — LISINOPRIL 20 MG PO TABS: ORAL_TABLET | ORAL | 1 refills | Status: DC

## 2019-07-06 MED ORDER — FLUOXETINE HCL 40 MG PO CAPS: 40.0000 mg | ORAL_CAPSULE | Freq: Every day | ORAL | 1 refills | Status: DC

## 2019-07-06 NOTE — Assessment & Plan Note (Signed)
Not currently on supplementation, will recheck levels and restart if needed. 

## 2019-07-06 NOTE — Assessment & Plan Note (Signed)
Not currently on supplementation, will check levels and restart if needed. Discussed good dietary sources and lots of sunlight. 

## 2019-07-06 NOTE — Assessment & Plan Note (Signed)
Recheck lipids, adjust as needed. Continue current regimen and good lifestyle habits 

## 2019-07-06 NOTE — Progress Notes (Signed)
BP 136/76 (BP Location: Right Arm, Patient Position: Sitting, Cuff Size: Normal)   Pulse 65   Temp 98.9 F (37.2 C) (Oral)   Ht 5\' 3"  (1.6 m)   Wt 179 lb (65.8 kg)   LMP  (LMP Unknown)   SpO2 96%   BMI 25.69 kg/m    Subjective:    Patient ID: Valerie Donovan, female    DOB: 02-12-1956, 63 y.o.   MRN: [id number]  HPI: Valerie Donovan is a 63 y.o. female  Chief Complaint  Patient presents with  . Hypertension  . Hyperlipidemia  . Anxiety   Patient presenting today for 6 month f/u. Home BPs have been 130s/70s typically. Taking her medication faithfully without side effects. Denies CP, SOB, HAs, dizziness.   No interest in smoking cessation, has been smoking since age 10. States this plus the prozac helps keep her anxiety down.   Takes prilosec as needed. This seems to control her GERD well.   On lipitor for HLD, which she tolerates well. Eats very healthy, mostly veggies from her garden and tries to stay active. Denies claudication, myalgias.   Depression screen [facility] 2/9 12/27/2018 06/21/2018 11/11/2017  Decreased Interest 1 0 3  Down, Depressed, Hopeless 1 0 0  PHQ - 2 Score 2 0 3  Altered sleeping 0 0 0  Tired, decreased energy 0 0 3  Change in appetite 0 0 0  Feeling bad or failure about yourself  1 0 0  Trouble concentrating 0 0 0  Moving slowly or fidgety/restless 0 0 0  Suicidal thoughts 0 0 0  PHQ-9 Score 3 0 6  Difficult doing work/chores - Not difficult at all -   GAD 7 : Generalized Anxiety Score 12/27/2018  Nervous, Anxious, on Edge 0  Control/stop worrying 1  Worry too much - different things 1  Trouble relaxing 0  Restless 0  Easily annoyed or irritable 0  Afraid - awful might happen 0  Total GAD 7 Score 2    Relevant past medical, surgical, family and social history reviewed and updated as indicated. Interim medical history since our last visit reviewed. Allergies and medications reviewed and updated.  Review of Systems  Per HPI unless  specifically indicated above     Objective:    BP 136/76 (BP Location: Right Arm, Patient Position: Sitting, Cuff Size: Normal)   Pulse 65   Temp 98.9 F (37.2 C) (Oral)   Ht 5\' 3"  (1.6 m)   Wt 145 lb (65.8 kg)   LMP  (LMP Unknown)   SpO2 96%   BMI 25.69 kg/m   Wt Readings from Last 3 Encounters:  07/06/19 145 lb (65.8 kg)  12/27/18 146 lb (66.2 kg)  06/22/18 148 lb (67.1 kg)    Physical Exam Vitals signs and nursing note reviewed.  Constitutional:      Appearance: Normal appearance. She is not ill-appearing.  HENT:     Head: Atraumatic.  Eyes:     Extraocular Movements: Extraocular movements intact.     Conjunctiva/sclera: Conjunctivae normal.  Neck:     Musculoskeletal: Normal range of motion and neck supple.  Cardiovascular:     Rate and Rhythm: Normal rate and regular rhythm.     Heart sounds: Normal heart sounds.  Pulmonary:     Effort: Pulmonary effort is normal.     Breath sounds: Normal breath sounds.  Musculoskeletal: Normal range of motion.  Skin:    General: Skin is warm and dry.  Neurological:  Mental Status: She is alert and oriented to person, place, and time.  Psychiatric:        Mood and Affect: Mood normal.        Thought Content: Thought content normal.        Judgment: Judgment normal.     Results for orders placed or performed in visit on 12/27/18  Microscopic Examination   URINE  Result Value Ref Range   WBC, UA 0-5 0 - 5 /hpf   RBC, UA 0-2 0 - 2 /hpf   Epithelial Cells (non renal) 0-10 0 - 10 /hpf   Bacteria, UA Few (A) None seen/Few  CBC with Differential/Platelet  Result Value Ref Range   WBC 9.2 3.4 - 10.8 x10E3/uL   RBC 4.52 3.77 - 5.28 x10E6/uL   Hemoglobin 14.5 11.1 - 15.9 g/dL   Hematocrit 42.9 34.0 - 46.6 %   MCV 95 79 - 97 fL   MCH 32.1 26.6 - 33.0 pg   MCHC 33.8 31.5 - 35.7 g/dL   RDW 11.8 11.7 - 15.4 %   Platelets 294 150 - 450 x10E3/uL   Neutrophils 63 Not Estab. %   Lymphs 27 Not Estab. %   Monocytes 7 Not  Estab. %   Eos 2 Not Estab. %   Basos 0 Not Estab. %   Neutrophils Absolute 5.9 1.4 - 7.0 x10E3/uL   Lymphocytes Absolute 2.5 0.7 - 3.1 x10E3/uL   Monocytes Absolute 0.6 0.1 - 0.9 x10E3/uL   EOS (ABSOLUTE) 0.1 0.0 - 0.4 x10E3/uL   Basophils Absolute 0.0 0.0 - 0.2 x10E3/uL   Immature Granulocytes 1 Not Estab. %   Immature Grans (Abs) 0.1 0.0 - 0.1 x10E3/uL  Comprehensive metabolic panel  Result Value Ref Range   Glucose 86 65 - 99 mg/dL   BUN 8 8 - 27 mg/dL   Creatinine, Ser 0.74 0.57 - 1.00 mg/dL   GFR calc non Af Amer 86 >59 mL/min/1.73   GFR calc Af Amer 99 >59 mL/min/1.73   BUN/Creatinine Ratio 11 (L) 12 - 28   Sodium 143 134 - 144 mmol/L   Potassium 4.2 3.5 - 5.2 mmol/L   Chloride 103 96 - 106 mmol/L   CO2 24 20 - 29 mmol/L   Calcium 9.7 8.7 - 10.3 mg/dL   Total Protein 6.8 6.0 - 8.5 g/dL   Albumin 4.4 3.8 - 4.8 g/dL   Globulin, Total 2.4 1.5 - 4.5 g/dL   Albumin/Globulin Ratio 1.8 1.2 - 2.2   Bilirubin Total 0.3 0.0 - 1.2 mg/dL   Alkaline Phosphatase 118 (H) 39 - 117 IU/L   AST 17 0 - 40 IU/L   ALT 19 0 - 32 IU/L  Lipid Panel w/o Chol/HDL Ratio  Result Value Ref Range   Cholesterol, Total 153 100 - 199 mg/dL   Triglycerides 95 0 - 149 mg/dL   HDL 45 >39 mg/dL   VLDL Cholesterol Cal 19 5 - 40 mg/dL   LDL Calculated 89 0 - 99 mg/dL  UA/M w/rflx Culture, Routine   Specimen: Urine   URINE  Result Value Ref Range   Specific Gravity, UA 1.010 1.005 - 1.030   pH, UA 5.5 5.0 - 7.5   Color, UA Yellow Yellow   Appearance Ur Clear Clear   Leukocytes, UA Negative Negative   Protein, UA Negative Negative/Trace   Glucose, UA Negative Negative   Ketones, UA Negative Negative   RBC, UA 2+ (A) Negative   Bilirubin, UA Negative Negative   Urobilinogen,  Ur 0.2 0.2 - 1.0 mg/dL   Nitrite, UA Negative Negative   Microscopic Examination See below:       Assessment & Plan:   Problem List Items Addressed This Visit      Cardiovascular and Mediastinum   Hypertension     Stable and WNL, continue current regimen      Relevant Medications   atorvastatin (LIPITOR) 20 MG tablet   lisinopril (ZESTRIL) 20 MG tablet   Other Relevant Orders   Comprehensive metabolic panel     Other   Hyperlipidemia, unspecified    Recheck lipids, adjust as needed. Continue current regimen and good lifestyle habits      Relevant Medications   atorvastatin (LIPITOR) 20 MG tablet   lisinopril (ZESTRIL) 20 MG tablet   Other Relevant Orders   Lipid Panel w/o Chol/HDL Ratio   Anxiety    Stable and under good control with prozac, continue current regimen      Relevant Medications   FLUoxetine (PROZAC) 40 MG capsule   Vitamin D deficiency    Not currently on supplementation, will check levels and restart if needed. Discussed good dietary sources and lots of sunlight      Relevant Orders   Vitamin D (25 hydroxy)   Vitamin B12 deficiency    Not currently on supplementation, will recheck levels and restart if needed      Relevant Orders   B12    Other Visit Diagnoses    Encounter for colorectal cancer screening    -  Primary   Relevant Orders   Ambulatory referral to Gastroenterology       Follow up plan: Return in about 6 months (around 01/06/2020) for CPE, AWV.

## 2019-07-06 NOTE — Assessment & Plan Note (Signed)
Stable and WNL, continue current regimen 

## 2019-07-06 NOTE — Assessment & Plan Note (Signed)
Stable and under good control with prozac, continue current regimen

## 2019-07-07 LAB — COMPREHENSIVE METABOLIC PANEL
ALT: 11 IU/L (ref 0–32)
AST: 11 IU/L (ref 0–40)
Albumin/Globulin Ratio: 2 (ref 1.2–2.2)
Albumin: 4.7 g/dL (ref 3.8–4.8)
Alkaline Phosphatase: 121 IU/L — ABNORMAL HIGH (ref 39–117)
BUN/Creatinine Ratio: 11 — ABNORMAL LOW (ref 12–28)
BUN: 9 mg/dL (ref 8–27)
Bilirubin Total: 0.5 mg/dL (ref 0.0–1.2)
CO2: 23 mmol/L (ref 20–29)
Calcium: 9.8 mg/dL (ref 8.7–10.3)
Chloride: 101 mmol/L (ref 96–106)
Creatinine, Ser: 0.82 mg/dL (ref 0.57–1.00)
GFR calc Af Amer: 87 mL/min/{1.73_m2} (ref >59)
GFR calc non Af Amer: 75 mL/min/{1.73_m2} (ref >59)
Globulin, Total: 2.4 g/dL (ref 1.5–4.5)
Glucose: 87 mg/dL (ref 65–99)
Potassium: 4.7 mmol/L (ref 3.5–5.2)
Sodium: 140 mmol/L (ref 134–144)
Total Protein: 7.1 g/dL (ref 6.0–8.5)

## 2019-07-07 LAB — VITAMIN B12: Vitamin B-12: 295 pg/mL (ref 232–1245)

## 2019-07-07 LAB — LIPID PANEL W/O CHOL/HDL RATIO
Cholesterol, Total: 150 mg/dL (ref 100–199)
HDL: 44 mg/dL (ref >39)
LDL Calculated: 80 mg/dL (ref 0–99)
Triglycerides: 130 mg/dL (ref 0–149)
VLDL Cholesterol Cal: 26 mg/dL (ref 5–40)

## 2019-07-07 LAB — VITAMIN D 25 HYDROXY (VIT D DEFICIENCY, FRACTURES): Vit D, 25-Hydroxy: 32.2 ng/mL (ref 30.0–100.0)

## 2019-07-11 ENCOUNTER — Other Ambulatory Visit: Payer: Self-pay

## 2019-07-11 ENCOUNTER — Encounter: Payer: Self-pay | Admitting: *Deleted

## 2019-07-11 ENCOUNTER — Telehealth: Payer: Self-pay

## 2019-07-11 DIAGNOSIS — Z800 Family history of malignant neoplasm of digestive organs: Secondary | ICD-10-CM

## 2019-07-11 DIAGNOSIS — Z86010 Personal history of colonic polyps: Secondary | ICD-10-CM

## 2019-07-11 DIAGNOSIS — Z1211 Encounter for screening for malignant neoplasm of colon: Secondary | ICD-10-CM

## 2019-07-11 MED ORDER — NA SULFATE-K SULFATE-MG SULF 17.5-3.13-1.6 GM/177ML PO SOLN: 1.0000 | Freq: Once | ORAL | 0 refills | Status: AC

## 2019-07-11 NOTE — Telephone Encounter (Signed)
Gastroenterology Pre-Procedure Review  Request Date: 07/20/19 Requesting Physician: Dr. Allen Norris  PATIENT REVIEW QUESTIONS: The patient responded to the following health history questions as indicated:    1. Are you having any GI issues? no 2. Do you have a personal history of Polyps? yes (5 years ago [facility] Dr. Gustavo Lah) 3. Do you have a family history of Colon Cancer or Polyps? yes (Father Colon Cancer) 4. Diabetes Mellitus? no 5. Joint replacements in the past 12 months?no 6. Major health problems in the past 3 months?06/15/2018 Uterine Cyst Removal [facility] 7. Any artificial heart valves, MVP, or defibrillator?no    MEDICATIONS & ALLERGIES:    Patient reports the following regarding taking any anticoagulation/antiplatelet therapy:   Plavix, Coumadin, Eliquis, Xarelto, Lovenox, Pradaxa, Brilinta, or Effient? no Aspirin? no  Patient confirms/reports the following medications:  Current Outpatient Medications  Medication Sig Dispense Refill  . atorvastatin (LIPITOR) 20 MG tablet Take 1 tablet (20 mg total) by mouth every morning. 90 tablet 1  . FLUoxetine (PROZAC) 40 MG capsule Take 1 capsule (40 mg total) by mouth daily. 90 capsule 1  . lisinopril (ZESTRIL) 20 MG tablet TAKE 1 TABLET(20 MG) BY MOUTH DAILY 90 tablet 1  . omeprazole (PRILOSEC) 20 MG capsule Take 1 capsule (20 mg total) by mouth daily as needed (for acid reflux/indigestion.). 90 capsule 1   No current facility-administered medications for this visit.     Patient confirms/reports the following allergies:  Allergies  Allergen Reactions  . Ciprofloxacin Anaphylaxis  . Clarithromycin Anaphylaxis  . Other Anaphylaxis    ANTIBIOTICS-PT CAN TOLERATE Z-PAC AND DOXYCYCLINE BUT ALL OTHERS SHE CANNOT  . Penicillin G Anaphylaxis    Has patient had a PCN reaction causing immediate rash, facial/tongue/throat swelling, SOB or lightheadedness with hypotension: Yes Has patient had a PCN reaction causing severe rash involving mucus  membranes or skin necrosis: No Has patient had a PCN reaction that required hospitalization: No Has patient had a PCN reaction occurring within the last 10 years: No If all of the above answers are "NO", then may proceed with Cephalosporin use.   . Codeine Itching    vomiting    No orders of the defined types were placed in this encounter.   AUTHORIZATION INFORMATION Primary Insurance: 1D#: Group #:  Secondary Insurance: 1D#: Group #:  SCHEDULE INFORMATION: Date: 07/20/19 Time: Location:[facility]

## 2019-07-12 ENCOUNTER — Other Ambulatory Visit: Payer: Self-pay

## 2019-07-12 ENCOUNTER — Telehealth: Payer: Self-pay | Admitting: Gastroenterology

## 2019-07-12 MED ORDER — GOLYTELY 236 G PO SOLR: 4000.0000 mL | Freq: Once | ORAL | 0 refills | Status: AC

## 2019-07-12 NOTE — Telephone Encounter (Signed)
LVM to make patient aware that a new rx for Golytely has been sent to pharmacy and I've asked the pharmacist to call her when its ready.  Thanks [employer]

## 2019-07-12 NOTE — Telephone Encounter (Signed)
Pt is calling again about Prev. Message  She needs  rx  Golitly sent to pharmacy she also would like a call once this is done

## 2019-07-12 NOTE — Telephone Encounter (Signed)
Pt left vm for Sharyn Lull regarding her rx at [facility] in [place] she needs something cheaper please call pt

## 2019-07-17 ENCOUNTER — Other Ambulatory Visit: Payer: Self-pay

## 2019-07-17 ENCOUNTER — Other Ambulatory Visit
Admission: RE | Admit: 2019-07-17 | Discharge: 2019-07-17 | Disposition: A | Discharge status: Home / Self Care | Payer: PPO | Source: Ambulatory Visit | Attending: Gastroenterology | Admitting: Gastroenterology

## 2019-07-17 DIAGNOSIS — Z01812 Encounter for preprocedural laboratory examination: Secondary | ICD-10-CM | POA: Insufficient documentation

## 2019-07-17 DIAGNOSIS — Z20828 Contact with and (suspected) exposure to other viral communicable diseases: Secondary | ICD-10-CM | POA: Insufficient documentation

## 2019-07-17 LAB — SARS CORONAVIRUS 2 (TAT 6-24 HRS): SARS Coronavirus 2: NEGATIVE

## 2019-07-19 NOTE — Discharge Instructions (Signed)

## 2019-07-20 ENCOUNTER — Ambulatory Visit: Payer: PPO | Admitting: Anesthesiology

## 2019-07-20 ENCOUNTER — Ambulatory Visit
Admission: RE | Admit: 2019-07-20 | Discharge: 2019-07-20 | Disposition: A | Discharge status: Home / Self Care | Payer: PPO | Attending: Gastroenterology | Admitting: Gastroenterology

## 2019-07-20 ENCOUNTER — Other Ambulatory Visit: Payer: Self-pay

## 2019-07-20 ENCOUNTER — Encounter
Admission: RE | Disposition: A | Discharge status: Home / Self Care | Payer: Self-pay | Source: Home / Self Care | Attending: Gastroenterology

## 2019-07-20 DIAGNOSIS — Z860100 Personal history of colon polyps, unspecified: Secondary | ICD-10-CM

## 2019-07-20 DIAGNOSIS — D124 Benign neoplasm of descending colon: Secondary | ICD-10-CM | POA: Insufficient documentation

## 2019-07-20 DIAGNOSIS — K648 Other hemorrhoids: Secondary | ICD-10-CM | POA: Insufficient documentation

## 2019-07-20 DIAGNOSIS — Z1211 Encounter for screening for malignant neoplasm of colon: Secondary | ICD-10-CM | POA: Insufficient documentation

## 2019-07-20 DIAGNOSIS — K635 Polyp of colon: Secondary | ICD-10-CM

## 2019-07-20 DIAGNOSIS — F419 Anxiety disorder, unspecified: Secondary | ICD-10-CM | POA: Diagnosis not present

## 2019-07-20 DIAGNOSIS — Z79899 Other long term (current) drug therapy: Secondary | ICD-10-CM | POA: Diagnosis not present

## 2019-07-20 DIAGNOSIS — F17210 Nicotine dependence, cigarettes, uncomplicated: Secondary | ICD-10-CM | POA: Insufficient documentation

## 2019-07-20 DIAGNOSIS — E785 Hyperlipidemia, unspecified: Secondary | ICD-10-CM | POA: Insufficient documentation

## 2019-07-20 DIAGNOSIS — I10 Essential (primary) hypertension: Secondary | ICD-10-CM | POA: Diagnosis not present

## 2019-07-20 DIAGNOSIS — M19042 Primary osteoarthritis, left hand: Secondary | ICD-10-CM | POA: Insufficient documentation

## 2019-07-20 DIAGNOSIS — K219 Gastro-esophageal reflux disease without esophagitis: Secondary | ICD-10-CM | POA: Diagnosis not present

## 2019-07-20 HISTORY — PX: POLYPECTOMY: SHX5525

## 2019-07-20 HISTORY — PX: COLONOSCOPY WITH PROPOFOL: SHX5780

## 2019-07-20 SURGERY — COLONOSCOPY WITH PROPOFOL
Anesthesia: General | Site: Rectum

## 2019-07-20 MED ORDER — OXYCODONE HCL 5 MG PO TABS: 5.0000 mg | ORAL_TABLET | Freq: Once | ORAL | Status: DC | PRN

## 2019-07-20 MED ORDER — PROPOFOL 10 MG/ML IV BOLUS
INTRAVENOUS | Status: DC | PRN
  Administered 2019-07-20 (×4): 20 mg via INTRAVENOUS
  Administered 2019-07-20: 30 mg via INTRAVENOUS
  Administered 2019-07-20: 70 mg via INTRAVENOUS
  Administered 2019-07-20 (×4): 20 mg via INTRAVENOUS

## 2019-07-20 MED ORDER — MEPERIDINE HCL 25 MG/ML IJ SOLN: 6.2500 mg | INTRAMUSCULAR | Status: DC | PRN

## 2019-07-20 MED ORDER — STERILE WATER FOR IRRIGATION IR SOLN
Status: DC | PRN
  Administered 2019-07-20: .05 mL

## 2019-07-20 MED ORDER — OXYCODONE HCL 5 MG/5ML PO SOLN: 5.0000 mg | Freq: Once | ORAL | Status: DC | PRN

## 2019-07-20 MED ORDER — LIDOCAINE HCL (CARDIAC) PF 100 MG/5ML IV SOSY
PREFILLED_SYRINGE | INTRAVENOUS | Status: DC | PRN
  Administered 2019-07-20: 40 mg via INTRAVENOUS

## 2019-07-20 MED ORDER — PROMETHAZINE HCL 25 MG/ML IJ SOLN: 6.2500 mg | INTRAMUSCULAR | Status: DC | PRN

## 2019-07-20 MED ORDER — LACTATED RINGERS IV SOLN
10.0000 mL/h | INTRAVENOUS | Status: DC
  Administered 2019-07-20: 12:00:00 via INTRAVENOUS

## 2019-07-20 MED ORDER — FENTANYL CITRATE (PF) 100 MCG/2ML IJ SOLN: 25.0000 ug | INTRAMUSCULAR | Status: DC | PRN

## 2019-07-20 SURGICAL SUPPLY — 7 items
CANISTER SUCT 1200ML W/VALVE (MISCELLANEOUS) ×3 IMPLANT
GOWN CVR UNV OPN BCK APRN NK (MISCELLANEOUS) ×4 IMPLANT
GOWN ISOL THUMB LOOP REG UNIV (MISCELLANEOUS) ×2
KIT ENDO PROCEDURE OLY (KITS) ×3 IMPLANT
SNARE SHORT THROW 13M SML OVAL (MISCELLANEOUS) ×1 IMPLANT
TRAP ETRAP POLY (MISCELLANEOUS) ×1 IMPLANT
WATER STERILE IRR 250ML POUR (IV SOLUTION) ×3 IMPLANT

## 2019-07-20 NOTE — Transfer of Care (Signed)
Immediate Anesthesia Transfer of Care Note  Patient: [name]  Procedure(s) Performed: COLONOSCOPY WITH PROPOFOL (N/A Rectum) POLYPECTOMY (Rectum)  Patient Location: PACU  Anesthesia Type: General  Level of Consciousness: awake, alert  and patient cooperative  Airway and Oxygen Therapy: Patient Spontanous Breathing and Patient connected to supplemental oxygen  Post-op Assessment: Post-op Vital signs reviewed, Patient's Cardiovascular Status Stable, Respiratory Function Stable, Patent Airway and No signs of Nausea or vomiting  Post-op Vital Signs: Reviewed and stable  Complications: No apparent anesthesia complications

## 2019-07-20 NOTE — H&P (Signed)
Lucilla Lame, MD [address] [place], [place] [address] Phone:[phone] Fax : [phone]  Primary Care Physician:  Volney American, [place] Primary Gastroenterologist:  Dr. Allen Norris  Pre-Procedure History & Physical: HPI:  Valerie Donovan is a 63 y.o. female is here for an colonoscopy.   Past Medical History:  Diagnosis Date  . Anxiety   . Arthritis    FINGERS  . GERD (gastroesophageal reflux disease)   . Hyperlipidemia   . Hypertension   . Postmenopausal bleeding 2014; 10/202018; 05/14/18   endometrial bx done 09/2015 - nl; nl paps    Past Surgical History:  Procedure Laterality Date  . HYSTEROSCOPY W/D&C N/A 06/15/2018   Procedure: DILATATION AND CURETTAGE /HYSTEROSCOPY;  Surgeon: Homero Fellers, MD;  Location: [facility] ORS;  Service: Gynecology;  Laterality: N/A;  . TUBAL LIGATION    . WISDOM TOOTH EXTRACTION      Prior to Admission medications   Medication Sig Start Date End Date Taking? Authorizing Provider  atorvastatin (LIPITOR) 20 MG tablet Take 1 tablet (20 mg total) by mouth every morning. 07/06/19  Yes Volney American, PA-C  FLUoxetine (PROZAC) 40 MG capsule Take 1 capsule (40 mg total) by mouth daily. 07/06/19  Yes Volney American, PA-C  lisinopril (ZESTRIL) 20 MG tablet TAKE 1 TABLET(20 MG) BY MOUTH DAILY 07/06/19  Yes Volney American, PA-C  omeprazole (PRILOSEC) 20 MG capsule Take 1 capsule (20 mg total) by mouth daily as needed (for acid reflux/indigestion.). 07/06/19  Yes Volney American, PA-C    Allergies as of 07/11/2019 - Review Complete 07/11/2019  Allergen Reaction Noted  . Ciprofloxacin Anaphylaxis 08/05/2016  . Clarithromycin Anaphylaxis 08/05/2016  . Other Anaphylaxis 04/24/2014  . Penicillin g Anaphylaxis 08/05/2016  . Codeine Itching 02/22/2017    Family History  Problem Relation Age of Onset  . Cancer Mother   . Diabetes Mother   . Cancer Father        colon  . Diabetes Sister   .  Diabetes Maternal Grandmother   . Diabetes Maternal Grandfather   . Diabetes Sister     Social History   Socioeconomic History  . Marital status: Divorced    Spouse name: Not on file  . Number of children: Not on file  . Years of education: Not on file  . Highest education level: Not on file  Occupational History  . Not on file  Social Needs  . Financial resource strain: Not on file  . Food insecurity    Worry: Not on file    Inability: Not on file  . Transportation needs    Medical: Not on file    Non-medical: Not on file  Tobacco Use  . Smoking status: Current Every Day Smoker    Packs/day: 0.75    Years: 44.00    Pack years: 33.00    Types: Cigarettes  . Smokeless tobacco: Never Used  Substance and Sexual Activity  . Alcohol use: No    Frequency: Never  . Drug use: No    Comment: CBD OIL IN JUUL  . Sexual activity: Not Currently    Birth control/protection: Post-menopausal  Lifestyle  . Physical activity    Days per week: Not on file    Minutes per session: Not on file  . Stress: Not on file  Relationships  . Social Herbalist on phone: Not on file    Gets together: Not on file    Attends religious service: Not  on file    Active member of club or organization: Not on file    Attends meetings of clubs or organizations: Not on file    Relationship status: Not on file  . Intimate partner violence    Fear of current or ex partner: Not on file    Emotionally abused: Not on file    Physically abused: Not on file    Forced sexual activity: Not on file  Other Topics Concern  . Not on file  Social History Narrative  . Not on file    Review of Systems: See HPI, otherwise negative ROS  Physical Exam: BP (!) 143/68   Pulse 65   Temp 97.7 F (36.5 C) (Temporal)   Ht 5\' 3"  (1.6 m)   Wt 64.9 kg   LMP  (LMP Unknown)   SpO2 99%   BMI 25.33 kg/m  General:   Alert,  pleasant and cooperative in NAD Head:  Normocephalic and atraumatic. Neck:   Supple; no masses or thyromegaly. Lungs:  Clear throughout to auscultation.    Heart:  Regular rate and rhythm. Abdomen:  Soft, nontender and nondistended. Normal bowel sounds, without guarding, and without rebound.   Neurologic:  Alert and  oriented x4;  grossly normal neurologically.  Impression/Plan: Valerie Donovan is here for an colonoscopy to be performed for history of colon polyps 03/07/2012  Risks, benefits, limitations, and alternatives regarding  colonoscopy have been reviewed with the patient.  Questions have been answered.  All parties agreeable.   Lucilla Lame, MD  07/20/2019, 10:51 AM

## 2019-07-20 NOTE — Anesthesia Procedure Notes (Signed)
Performed by: Dawit Tankard, CRNA Pre-anesthesia Checklist: Patient identified, Emergency Drugs available, Suction available, Timeout performed and Patient being monitored Patient Re-evaluated:Patient Re-evaluated prior to induction Oxygen Delivery Method: Nasal cannula Placement Confirmation: positive ETCO2       

## 2019-07-20 NOTE — Anesthesia Postprocedure Evaluation (Signed)
Anesthesia Post Note  Patient: Kinzlee Selvy Obenchain  Procedure(s) Performed: COLONOSCOPY WITH PROPOFOL (N/A Rectum) POLYPECTOMY (Rectum)  Patient location during evaluation: PACU Anesthesia Type: General Level of consciousness: awake and alert Pain management: pain level controlled Vital Signs Assessment: post-procedure vital signs reviewed and stable Respiratory status: spontaneous breathing, nonlabored ventilation, respiratory function stable and patient connected to nasal cannula oxygen Cardiovascular status: blood pressure returned to baseline and stable Postop Assessment: no apparent nausea or vomiting Anesthetic complications: no    SCOURAS, NICOLE ELAINE

## 2019-07-20 NOTE — Op Note (Signed)
[facility] Gastroenterology Patient Name: Valerie Donovan Procedure Date: 07/20/2019 11:41 AM MRN: [id number] Account #: [id number] Date of Birth: 1956-04-25 Admit Type: Outpatient Age: 63 Room: [facility] OR ROOM 01 Gender: Female Note Status: Finalized Procedure:            Colonoscopy Indications:          High risk colon cancer surveillance: Personal history                        of colonic polyps Providers:            Lucilla Lame MD, MD Referring MD:         Volney American (Referring MD) Medicines:            Propofol per Anesthesia Complications:        No immediate complications. Procedure:            Pre-Anesthesia Assessment:                       - Prior to the procedure, a History and Physical was                        performed, and patient medications and allergies were                        reviewed. The patient's tolerance of previous                        anesthesia was also reviewed. The risks and benefits of                        the procedure and the sedation options and risks were                        discussed with the patient. All questions were                        answered, and informed consent was obtained. Prior                        Anticoagulants: The patient has taken no previous                        anticoagulant or antiplatelet agents. ASA Grade                        Assessment: II - A patient with mild systemic disease.                        After reviewing the risks and benefits, the patient was                        deemed in satisfactory condition to undergo the                        procedure.                       After obtaining informed consent, the colonoscope was  passed under direct vision. Throughout the procedure,                        the patient's blood pressure, pulse, and oxygen                        saturations were monitored continuously. The was   introduced through the anus and advanced to the the                        cecum, identified by appendiceal orifice and ileocecal                        valve. The colonoscopy was performed without                        difficulty. The patient tolerated the procedure well.                        The quality of the bowel preparation was excellent. Findings:      The perianal and digital rectal examinations were normal.      A 4 mm polyp was found in the descending colon. The polyp was sessile.       The polyp was removed with a cold snare. Resection and retrieval were       complete.      Non-bleeding internal hemorrhoids were found during retroflexion. The       hemorrhoids were Grade II (internal hemorrhoids that prolapse but reduce       spontaneously). Impression:           - One 4 mm polyp in the descending colon, removed with                        a cold snare. Resected and retrieved.                       - Non-bleeding internal hemorrhoids. Recommendation:       - Discharge patient to home.                       - Resume previous diet.                       - Continue present medications.                       - Await pathology results.                       - Repeat colonoscopy in 5 years for surveillance. Procedure Code(s):    --- Professional ---                       [phone], Colonoscopy, flexible; with removal of tumor(s),                        polyp(s), or other lesion(s) by snare technique Diagnosis Code(s):    --- Professional ---                       Z86.010, Personal history of colonic polyps  K63.5, Polyp of colon CPT copyright 2019 American Medical Association. All rights reserved. The codes documented in this report are preliminary and upon coder review may  be revised to meet current compliance requirements. Lucilla Lame MD, MD 07/20/2019 12:02:46 PM This report has been signed electronically. Number of Addenda: 0 Note Initiated On:  07/20/2019 11:41 AM Scope Withdrawal Time: 0 hours 9 minutes 4 seconds  Total Procedure Duration: 0 hours 13 minutes 7 seconds  Estimated Blood Loss: Estimated blood loss: none.      [facility]

## 2019-07-20 NOTE — Anesthesia Preprocedure Evaluation (Signed)
Anesthesia Evaluation  Patient identified by MRN, date of birth, ID band Patient awake    Reviewed: Allergy & Precautions, H&P , NPO status , Patient's Chart, lab work & pertinent test results, reviewed documented beta blocker date and time   Airway Mallampati: II  TM Distance: >3 FB Neck ROM: full    Dental no notable dental hx.    Pulmonary neg pulmonary ROS, Current Smoker and Patient abstained from smoking.,    Pulmonary exam normal breath sounds clear to auscultation       Cardiovascular Exercise Tolerance: Good hypertension, negative cardio ROS   Rhythm:regular Rate:Normal     Neuro/Psych Anxiety negative neurological ROS  negative psych ROS   GI/Hepatic Neg liver ROS, GERD  ,  Endo/Other  negative endocrine ROS  Renal/GU negative Renal ROS  negative genitourinary   Musculoskeletal   Abdominal   Peds  Hematology negative hematology ROS (+)   Anesthesia Other Findings Smoker  Reproductive/Obstetrics negative OB ROS                             Anesthesia Physical Anesthesia Plan  ASA: II  Anesthesia Plan: General   Post-op Pain Management:    Induction:   PONV Risk Score and Plan:   Airway Management Planned:   Additional Equipment:   Intra-op Plan:   Post-operative Plan:   Informed Consent: I have reviewed the patients History and Physical, chart, labs and discussed the procedure including the risks, benefits and alternatives for the proposed anesthesia with the patient or authorized representative who has indicated his/her understanding and acceptance.     Dental Advisory Given  Plan Discussed with: CRNA  Anesthesia Plan Comments:         Anesthesia Quick Evaluation

## 2019-07-21 ENCOUNTER — Encounter: Payer: Self-pay | Admitting: Obstetrics and Gynecology

## 2019-07-23 ENCOUNTER — Encounter: Payer: Self-pay | Admitting: Gastroenterology

## 2019-07-27 ENCOUNTER — Other Ambulatory Visit: Payer: Self-pay | Admitting: Family Medicine

## 2020-01-07 ENCOUNTER — Encounter: Payer: Self-pay | Admitting: Family Medicine

## 2020-01-07 ENCOUNTER — Ambulatory Visit (INDEPENDENT_AMBULATORY_CARE_PROVIDER_SITE_OTHER): Payer: PPO | Admitting: Family Medicine

## 2020-01-07 VITALS — BP 119/68 | Ht 63.0 in | Wt 140.0 lb

## 2020-01-07 DIAGNOSIS — K219 Gastro-esophageal reflux disease without esophagitis: Secondary | ICD-10-CM

## 2020-01-07 DIAGNOSIS — F419 Anxiety disorder, unspecified: Secondary | ICD-10-CM

## 2020-01-07 DIAGNOSIS — I10 Essential (primary) hypertension: Secondary | ICD-10-CM

## 2020-01-07 DIAGNOSIS — Z87892 Personal history of anaphylaxis: Secondary | ICD-10-CM | POA: Diagnosis not present

## 2020-01-07 DIAGNOSIS — E7800 Pure hypercholesterolemia, unspecified: Secondary | ICD-10-CM

## 2020-01-07 MED ORDER — EPINEPHRINE 0.3 MG/0.3ML IJ SOAJ: 0.3000 mg | INTRAMUSCULAR | 1 refills | Status: DC | PRN

## 2020-01-07 MED ORDER — ATORVASTATIN CALCIUM 20 MG PO TABS: ORAL_TABLET | ORAL | 1 refills | Status: DC

## 2020-01-07 MED ORDER — FLUOXETINE HCL 40 MG PO CAPS: 40.0000 mg | ORAL_CAPSULE | Freq: Every day | ORAL | 1 refills | Status: DC

## 2020-01-07 MED ORDER — LISINOPRIL 20 MG PO TABS: ORAL_TABLET | ORAL | 1 refills | Status: DC

## 2020-01-07 MED ORDER — OMEPRAZOLE 20 MG PO CPDR: 20.0000 mg | DELAYED_RELEASE_CAPSULE | Freq: Every day | ORAL | 1 refills | Status: DC | PRN

## 2020-01-07 NOTE — Progress Notes (Signed)
BP 119/68   Ht 5\' 3"  (1.6 m)   Wt 140 lb (63.5 kg)   LMP  (LMP Unknown)   BMI 24.80 kg/m    Subjective:    Patient ID: Valerie Donovan, female    DOB: 1956-02-07, 64 y.o.   MRN: [id number]  HPI: Valerie Donovan is a 64 y.o. female  Chief Complaint  Patient presents with  . Hypertension  . Hyperlipidemia  . Anxiety    . This visit was completed via telephone due to the restrictions of the COVID-19 pandemic. All issues as above were discussed and addressed. Physical exam was done as above through visual confirmation on telephone. If it was felt that the patient should be evaluated in the office, they were directed there. The patient verbally consented to this visit. . Location of the patient: home . Location of the provider: work . Those involved with this call:  . Provider: Merrie Roof, PA-C . CMA: Lesle Chris, [place] . Front Desk/Registration: Jill Side  . Time spent on call: 25 minutes on the phone discussing health concerns. 5 minutes total spent in review of patient's record and preparation of their chart. I verified patient identity using two factors (patient name and date of birth). Patient consents verbally to being seen via telemedicine visit today.   Patient presenting today for 6 month f/u chronic conditions. Taking medicines faithfully without side effects.   HTN - Home BPs running 110-130/70s. Denies CP, SOB, HAs, dizziness. Trying to eat well and stay active.   HLD - taking lipitor without issue. Denies myalgias, claudication. Watching diet closely.   GERD - Prilosec keeping sxs at [place]. No breakthrough sxs, melena, abdominal pain.   Anxiety - Prozac continue to control anxiety well. Denies SI/HI, panic attacks, crying spells.   Pain in thumbs the past few months. Trying aleve and voltaren gel which does help some. No redness, swelling, numbness, weakness, injury.   Pt wanting to take the COVID vaccine but has had vaccine reactions in the past (mostly  rashes and itching) and anaphylaxis to numerous medications. Does not have a non-expired epi pen at home. Wanting to know if she can have COVID vaccine.   Depression screen [facility] 2/9 01/07/2020 12/27/2018 06/21/2018  Decreased Interest 0 1 0  Down, Depressed, Hopeless 0 1 0  PHQ - 2 Score 0 2 0  Altered sleeping 0 0 0  Tired, decreased energy 0 0 0  Change in appetite 0 0 0  Feeling bad or failure about yourself  0 1 0  Trouble concentrating 0 0 0  Moving slowly or fidgety/restless 0 0 0  Suicidal thoughts 0 0 0  PHQ-9 Score 0 3 0  Difficult doing work/chores - - Not difficult at all   GAD 7 : Generalized Anxiety Score 01/07/2020 12/27/2018  Nervous, Anxious, on Edge 2 0  Control/stop worrying 0 1  Worry too much - different things 2 1  Trouble relaxing 2 0  Restless 0 0  Easily annoyed or irritable 0 0  Afraid - awful might happen 1 0  Total GAD 7 Score 7 2  Anxiety Difficulty Not difficult at all -     Relevant past medical, surgical, family and social history reviewed and updated as indicated. Interim medical history since our last visit reviewed. Allergies and medications reviewed and updated.  Review of Systems  Per HPI unless specifically indicated above     Objective:    BP 119/68   Ht 5\' 3"  (1.6  m)   Wt 140 lb (63.5 kg)   LMP  (LMP Unknown)   BMI 24.80 kg/m   Wt Readings from Last 3 Encounters:  01/07/20 140 lb (63.5 kg)  07/20/19 143 lb (64.9 kg)  07/06/19 145 lb (65.8 kg)    Physical Exam  Unable to perform PE due to patient lack of access to video technology for today's visit.   Results for orders placed or performed during the hospital encounter of 07/17/19  SARS CORONAVIRUS 2 Nasal Swab Aptima Multi Swab   Specimen: Aptima Multi Swab; Nasal Swab  Result Value Ref Range   SARS Coronavirus 2 NEGATIVE NEGATIVE      Assessment & Plan:   Problem List Items Addressed This Visit      Cardiovascular and Mediastinum   Hypertension - Primary    BPs stable  and WNL, continue current regimen      Relevant Medications   EPINEPHrine (EPIPEN 2-PAK) 0.3 mg/0.3 mL IJ SOAJ injection   lisinopril (ZESTRIL) 20 MG tablet   atorvastatin (LIPITOR) 20 MG tablet   Other Relevant Orders   Comprehensive metabolic panel     Digestive   GERD (gastroesophageal reflux disease)    Stable and under good control, continue current regimen      Relevant Medications   omeprazole (PRILOSEC) 20 MG capsule     Other   Hyperlipidemia, unspecified    Rechech lipids, adjust as needed. Continue current regimen and good lifestyle habits      Relevant Medications   EPINEPHrine (EPIPEN 2-PAK) 0.3 mg/0.3 mL IJ SOAJ injection   lisinopril (ZESTRIL) 20 MG tablet   atorvastatin (LIPITOR) 20 MG tablet   Other Relevant Orders   Lipid Panel w/o Chol/HDL Ratio   Anxiety    Stable and well controlled, continue current regimen      Relevant Medications   FLUoxetine (PROZAC) 40 MG capsule    Other Visit Diagnoses    History of anaphylaxis       Will provide new epi pens, discussed some risks with getting COVID vaccine and if deciding to do so should be under close supervision       Follow up plan: Return in about 6 months (around 07/06/2020) for CPE.

## 2020-01-10 ENCOUNTER — Other Ambulatory Visit: Payer: Self-pay | Admitting: Family Medicine

## 2020-01-10 ENCOUNTER — Encounter: Payer: Self-pay | Admitting: Family Medicine

## 2020-01-10 DIAGNOSIS — K219 Gastro-esophageal reflux disease without esophagitis: Secondary | ICD-10-CM | POA: Insufficient documentation

## 2020-01-10 NOTE — Assessment & Plan Note (Signed)
Stable and well controlled, continue current regimen 

## 2020-01-10 NOTE — Progress Notes (Signed)
Lvm for 6 month physical

## 2020-01-10 NOTE — Assessment & Plan Note (Signed)
Stable and under good control, continue current regimen 

## 2020-01-10 NOTE — Assessment & Plan Note (Signed)
BPs stable and WNL, continue current regimen 

## 2020-01-10 NOTE — Assessment & Plan Note (Signed)
Rechech lipids, adjust as needed. Continue current regimen and good lifestyle habits

## 2020-01-14 ENCOUNTER — Telehealth: Payer: Self-pay | Admitting: Family Medicine

## 2020-01-14 NOTE — Chronic Care Management (AMB) (Signed)
  Chronic Care Management   Outreach Note  01/14/2020 Name: Valerie Donovan MRN: [id number] DOB: December 07, 1955  Valerie Donovan is a 64 y.o. year old female who is a primary care patient of Volney American, [place]. I reached out to [place] by phone today in response to a referral sent by Ms. Valerie Masters [facility] health plan.     An unsuccessful telephone outreach was attempted today. The patient was referred to the case management team for assistance with care management and care coordination.   Follow Up Plan: A HIPPA compliant phone message was left for the patient providing contact information and requesting a return call.  The care management team will reach out to the patient again over the next 7 days.  If patient returns call to provider office, please advise to call [facility] at [address], [place], [place], [place] [address] Direct Dial: [phone] [email or web address] Website: [email or web address]

## 2020-01-14 NOTE — Chronic Care Management (AMB) (Signed)
Chronic Care Management   Note  01/14/2020 Name: Snigdha Howser Quade MRN: [id number] DOB: 1956/10/12  Sonny Masters Radilla is a 64 y.o. year old female who is a primary care patient of Volney American, [place]. I reached out to [place] by phone today in response to a referral sent by Ms. Sonny Masters [facility] health plan.     Ms. All was given information about Chronic Care Management services today including:  1. CCM service includes personalized support from designated clinical staff supervised by her physician, including individualized plan of care and coordination with other care providers 2. 24/7 contact phone numbers for assistance for urgent and routine care needs. 3. Service will only be billed when office clinical staff spend 20 minutes or more in a month to coordinate care. 4. Only one practitioner may furnish and bill the service in a calendar month. 5. The patient may stop CCM services at any time (effective at the end of the month) by phone call to the office staff. 6. The patient will be responsible for cost sharing (co-pay) of up to 20% of the service fee (after annual deductible is met).  Patient did not agree to enrollment in care management services and does not wish to consider at this time.  Follow up plan: The patient has been provided with contact information for the care management team and has been advised to call with any health related questions or concerns.   Noreene Larsson, [address] [place], [place], [place] [address] Direct Dial: [phone] [email or web address] Website: [email or web address]

## 2020-01-22 ENCOUNTER — Other Ambulatory Visit: Payer: Self-pay

## 2020-01-22 ENCOUNTER — Other Ambulatory Visit: Payer: PPO

## 2020-01-22 DIAGNOSIS — I10 Essential (primary) hypertension: Secondary | ICD-10-CM | POA: Diagnosis not present

## 2020-01-22 DIAGNOSIS — E7800 Pure hypercholesterolemia, unspecified: Secondary | ICD-10-CM | POA: Diagnosis not present

## 2020-01-23 ENCOUNTER — Encounter: Payer: Self-pay | Admitting: Family Medicine

## 2020-01-23 LAB — COMPREHENSIVE METABOLIC PANEL
ALT: 12 IU/L (ref 0–32)
AST: 10 IU/L (ref 0–40)
Albumin/Globulin Ratio: 1.9 (ref 1.2–2.2)
Albumin: 4.5 g/dL (ref 3.8–4.8)
Alkaline Phosphatase: 118 IU/L — ABNORMAL HIGH (ref 39–117)
BUN/Creatinine Ratio: 13 (ref 12–28)
BUN: 10 mg/dL (ref 8–27)
Bilirubin Total: 0.3 mg/dL (ref 0.0–1.2)
CO2: 22 mmol/L (ref 20–29)
Calcium: 9.6 mg/dL (ref 8.7–10.3)
Chloride: 105 mmol/L (ref 96–106)
Creatinine, Ser: 0.8 mg/dL (ref 0.57–1.00)
GFR calc Af Amer: 89 mL/min/{1.73_m2} (ref >59)
GFR calc non Af Amer: 78 mL/min/{1.73_m2} (ref >59)
Globulin, Total: 2.4 g/dL (ref 1.5–4.5)
Glucose: 99 mg/dL (ref 65–99)
Potassium: 4.4 mmol/L (ref 3.5–5.2)
Sodium: 141 mmol/L (ref 134–144)
Total Protein: 6.9 g/dL (ref 6.0–8.5)

## 2020-01-23 LAB — LIPID PANEL W/O CHOL/HDL RATIO
Cholesterol, Total: 145 mg/dL (ref 100–199)
HDL: 39 mg/dL — ABNORMAL LOW (ref >39)
LDL Chol Calc (NIH): 85 mg/dL (ref 0–99)
Triglycerides: 117 mg/dL (ref 0–149)
VLDL Cholesterol Cal: 21 mg/dL (ref 5–40)

## 2020-04-18 DIAGNOSIS — D23112 Other benign neoplasm of skin of right lower eyelid, including canthus: Secondary | ICD-10-CM | POA: Diagnosis not present

## 2020-04-18 DIAGNOSIS — D485 Neoplasm of uncertain behavior of skin: Secondary | ICD-10-CM | POA: Diagnosis not present

## 2020-04-18 DIAGNOSIS — L608 Other nail disorders: Secondary | ICD-10-CM | POA: Diagnosis not present

## 2020-06-30 DIAGNOSIS — H2513 Age-related nuclear cataract, bilateral: Secondary | ICD-10-CM | POA: Diagnosis not present

## 2020-07-15 ENCOUNTER — Other Ambulatory Visit: Payer: Self-pay | Admitting: Family Medicine

## 2020-07-21 ENCOUNTER — Other Ambulatory Visit: Payer: Self-pay

## 2020-07-21 ENCOUNTER — Ambulatory Visit: Payer: PPO | Attending: Internal Medicine

## 2020-07-21 ENCOUNTER — Ambulatory Visit (INDEPENDENT_AMBULATORY_CARE_PROVIDER_SITE_OTHER): Payer: PPO | Admitting: Family Medicine

## 2020-07-21 ENCOUNTER — Encounter: Payer: Self-pay | Admitting: Family Medicine

## 2020-07-21 VITALS — BP 136/86 | HR 62 | Temp 98.4°F | Ht 63.0 in | Wt 141.0 lb

## 2020-07-21 DIAGNOSIS — I10 Essential (primary) hypertension: Secondary | ICD-10-CM | POA: Diagnosis not present

## 2020-07-21 DIAGNOSIS — F419 Anxiety disorder, unspecified: Secondary | ICD-10-CM | POA: Diagnosis not present

## 2020-07-21 DIAGNOSIS — Z1231 Encounter for screening mammogram for malignant neoplasm of breast: Secondary | ICD-10-CM | POA: Diagnosis not present

## 2020-07-21 DIAGNOSIS — G8929 Other chronic pain: Secondary | ICD-10-CM | POA: Diagnosis not present

## 2020-07-21 DIAGNOSIS — Z23 Encounter for immunization: Secondary | ICD-10-CM

## 2020-07-21 DIAGNOSIS — M79645 Pain in left finger(s): Secondary | ICD-10-CM

## 2020-07-21 DIAGNOSIS — Z0000 Encounter for general adult medical examination without abnormal findings: Secondary | ICD-10-CM

## 2020-07-21 DIAGNOSIS — K219 Gastro-esophageal reflux disease without esophagitis: Secondary | ICD-10-CM | POA: Diagnosis not present

## 2020-07-21 DIAGNOSIS — E7800 Pure hypercholesterolemia, unspecified: Secondary | ICD-10-CM | POA: Diagnosis not present

## 2020-07-21 LAB — UA/M W/RFLX CULTURE, ROUTINE
Bilirubin, UA: NEGATIVE
Glucose, UA: NEGATIVE
Ketones, UA: NEGATIVE
Leukocytes,UA: NEGATIVE
Nitrite, UA: NEGATIVE
Protein,UA: NEGATIVE
Specific Gravity, UA: 1.01 (ref 1.005–1.030)
Urobilinogen, Ur: 0.2 mg/dL (ref 0.2–1.0)
pH, UA: 5 (ref 5.0–7.5)

## 2020-07-21 LAB — MICROSCOPIC EXAMINATION: WBC, UA: NONE SEEN /hpf (ref 0–5)

## 2020-07-21 MED ORDER — LISINOPRIL 20 MG PO TABS: ORAL_TABLET | ORAL | 1 refills | Status: DC

## 2020-07-21 MED ORDER — OMEPRAZOLE 20 MG PO CPDR: 20.0000 mg | DELAYED_RELEASE_CAPSULE | Freq: Every day | ORAL | 1 refills | Status: DC | PRN

## 2020-07-21 MED ORDER — ATORVASTATIN CALCIUM 20 MG PO TABS: ORAL_TABLET | ORAL | 1 refills | Status: DC

## 2020-07-21 MED ORDER — PREDNISONE 10 MG PO TABS: ORAL_TABLET | ORAL | 0 refills | Status: DC

## 2020-07-21 MED ORDER — FLUOXETINE HCL 40 MG PO CAPS: ORAL_CAPSULE | ORAL | 1 refills | Status: DC

## 2020-07-21 NOTE — Assessment & Plan Note (Signed)
Chronic, stable and well controlled. Continue present medications. 

## 2020-07-21 NOTE — Assessment & Plan Note (Signed)
Stable and well controlled, continue PPI therapy and diet modifications. 

## 2020-07-21 NOTE — Progress Notes (Signed)
BP 136/86   Pulse 62   Temp 98.4 F (36.9 C) (Oral)   Ht 5\' 3"  (1.6 m)   Wt 141 lb (64 kg)   LMP  (LMP Unknown)   SpO2 96%   BMI 24.98 kg/m    Subjective:    Patient ID: Valerie Donovan, female    DOB: [birth year], 64 y.o.   MRN: [id number]  HPI: Valerie Donovan is a 64 y.o. female presenting on 07/21/2020 for comprehensive medical examination. Current medical complaints include:see below  Left thumb pain, stiffness, weakness. Wearing brace, using voltaren gel without any benefit. Denies injury. Worked as a Psychiatric nurse for years and feels this repetitive activity may have brought this on.   HTN - home BPs running 130s/80s when checked. Taking lisionpril faithfully without side effects. Denies Cp, SOB, HAs, dizziness. Eats healthy and stays active.   HLD - on lipitor, tolerating well. Denies myalgias, claudication.   Taking prozac faithfully which controls her anxiety sxs. Denies side effects, frequent panic attacks, irritability. No SI/HI.   Depression screen [facility] 2/9 07/21/2020 01/07/2020 12/27/2018  Decreased Interest 1 0 1  Down, Depressed, Hopeless 1 0 1  PHQ - 2 Score 2 0 2  Altered sleeping 0 0 0  Tired, decreased energy 0 0 0  Change in appetite 0 0 0  Feeling bad or failure about yourself  1 0 1  Trouble concentrating 0 0 0  Moving slowly or fidgety/restless 0 0 0  Suicidal thoughts 0 0 0  PHQ-9 Score 3 0 3  Difficult doing work/chores - - -   GAD 7 : Generalized Anxiety Score 07/21/2020 01/07/2020 12/27/2018  Nervous, Anxious, on Edge 1 2 0  Control/stop worrying 1 0 1  Worry too much - different things 1 2 1   Trouble relaxing 1 2 0  Restless 1 0 0  Easily annoyed or irritable 0 0 0  Afraid - awful might happen 0 1 0  Total GAD 7 Score 5 7 2   Anxiety Difficulty Not difficult at all Not difficult at all -   She currently lives with: Menopausal Symptoms: no  Depression Screen done today and results listed below:  Depression screen [facility] 2/9 07/21/2020 01/07/2020  12/27/2018 06/21/2018 11/11/2017  Decreased Interest 1 0 1 0 3  Down, Depressed, Hopeless 1 0 1 0 0  PHQ - 2 Score 2 0 2 0 3  Altered sleeping 0 0 0 0 0  Tired, decreased energy 0 0 0 0 3  Change in appetite 0 0 0 0 0  Feeling bad or failure about yourself  1 0 1 0 0  Trouble concentrating 0 0 0 0 0  Moving slowly or fidgety/restless 0 0 0 0 0  Suicidal thoughts 0 0 0 0 0  PHQ-9 Score 3 0 3 0 6  Difficult doing work/chores - - - Not difficult at all -    The patient does not have a history of falls. I did complete a risk assessment for falls. A plan of care for falls was documented.   Past Medical History:  Past Medical History:  Diagnosis Date  . Anxiety   . Arthritis    FINGERS  . GERD (gastroesophageal reflux disease)   . Hyperlipidemia   . Hypertension   . Postmenopausal bleeding 2014; 10/202018; 05/14/18   endometrial bx done 09/2015 - nl; nl paps    Surgical History:  Past Surgical History:  Procedure Laterality Date  . COLONOSCOPY WITH PROPOFOL N/A 07/20/2019  Procedure: COLONOSCOPY WITH PROPOFOL;  Surgeon: Lucilla Lame, MD;  Location: [facility];  Service: Endoscopy;  Laterality: N/A;  . HYSTEROSCOPY WITH D & C N/A 06/15/2018   Procedure: DILATATION AND CURETTAGE /HYSTEROSCOPY;  Surgeon: Homero Fellers, MD;  Location: [facility];  Service: Gynecology;  Laterality: N/A;  . POLYPECTOMY  07/20/2019   Procedure: POLYPECTOMY;  Surgeon: Lucilla Lame, MD;  Location: [facility];  Service: Endoscopy;;  . TUBAL LIGATION    . WISDOM TOOTH EXTRACTION      Medications:  Current Outpatient Medications on File Prior to Visit  Medication Sig  . EPINEPHrine (EPIPEN 2-PAK) 0.3 mg/0.3 mL IJ SOAJ injection Inject 0.3 mLs (0.3 mg total) into the muscle as needed for anaphylaxis.   No current facility-administered medications on file prior to visit.    Allergies:  Allergies  Allergen Reactions  . Ciprofloxacin Anaphylaxis  . Clarithromycin Anaphylaxis  .  Other Anaphylaxis    ANTIBIOTICS-PT CAN TOLERATE Z-PAC AND DOXYCYCLINE BUT ALL OTHERS SHE CANNOT  . Penicillin G Anaphylaxis    Has patient had a PCN reaction causing immediate rash, facial/tongue/throat swelling, SOB or lightheadedness with hypotension: Yes Has patient had a PCN reaction causing severe rash involving mucus membranes or skin necrosis: No Has patient had a PCN reaction that required hospitalization: No Has patient had a PCN reaction occurring within the last 10 years: No If all of the above answers are "NO", then may proceed with Cephalosporin use.   . Codeine Itching    vomiting    Social History:  Social History   Socioeconomic History  . Marital status: Divorced    Spouse name: Not on file  . Number of children: Not on file  . Years of education: Not on file  . Highest education level: Not on file  Occupational History  . Not on file  Tobacco Use  . Smoking status: Current Every Day Smoker    Packs/day: 1.00    Years: 44.00    Pack years: 44.00    Types: Cigarettes  . Smokeless tobacco: Never Used  Vaping Use  . Vaping Use: Former  Substance and Sexual Activity  . Alcohol use: No  . Drug use: No    Comment: CBD OIL IN JUUL  . Sexual activity: Not Currently    Birth control/protection: Post-menopausal  Other Topics Concern  . Not on file  Social History Narrative  . Not on file   Social Determinants of Health   Financial Resource Strain:   . Difficulty of Paying Living Expenses:   Food Insecurity:   . Worried About Charity fundraiser in the Last Year:   . Arboriculturist in the Last Year:   Transportation Needs:   . Film/video editor (Medical):   [name] Lack of Transportation (Non-Medical):   Physical Activity:   . Days of Exercise per Week:   . Minutes of Exercise per Session:   Stress:   . Feeling of Stress :   Social Connections:   . Frequency of Communication with Friends and Family:   . Frequency of Social Gatherings with Friends  and Family:   . Attends Religious Services:   . Active Member of Clubs or Organizations:   . Attends Archivist Meetings:   [name] Marital Status:   Intimate Partner Violence:   . Fear of Current or Ex-Partner:   . Emotionally Abused:   [name] Physically Abused:   . Sexually Abused:    Social History  Tobacco Use  Smoking Status Current Every Day Smoker  . Packs/day: 1.00  . Years: 44.00  . Pack years: 44.00  . Types: Cigarettes  Smokeless Tobacco Never Used   Social History   Substance and Sexual Activity  Alcohol Use No    Family History:  Family History  Problem Relation Age of Onset  . Cancer Mother   . Diabetes Mother   . Cancer Father        colon  . Diabetes Sister   . Diabetes Maternal Grandmother   . Diabetes Maternal Grandfather   . Diabetes Sister     Past medical history, surgical history, medications, allergies, family history and social history reviewed with patient today and changes made to appropriate areas of the chart.   Review of Systems - General ROS: negative Psychological ROS: negative Ophthalmic ROS: negative ENT ROS: negative Allergy and Immunology ROS: negative Hematological and Lymphatic ROS: negative Endocrine ROS: negative Breast ROS: negative for breast lumps Respiratory ROS: no cough, shortness of breath, or wheezing Cardiovascular ROS: no chest pain or dyspnea on exertion Gastrointestinal ROS: no abdominal pain, change in bowel habits, or black or bloody stools Genito-Urinary ROS: no dysuria, trouble voiding, or hematuria Musculoskeletal ROS: positive for - left thumb pain Neurological ROS: no TIA or stroke symptoms Dermatological ROS: negative All other ROS negative except what is listed above and in the HPI.      Objective:    BP 136/86   Pulse 62   Temp 98.4 F (36.9 C) (Oral)   Ht 5\' 3"  (1.6 m)   Wt 141 lb (64 kg)   LMP  (LMP Unknown)   SpO2 96%   BMI 24.98 kg/m   Wt Readings from Last 3 Encounters:  07/21/20  141 lb (64 kg)  01/07/20 140 lb (63.5 kg)  07/20/19 143 lb (64.9 kg)    Physical Exam Vitals and nursing note reviewed.  Constitutional:      General: She is not in acute distress.    Appearance: She is well-developed.  HENT:     Head: Atraumatic.     Right Ear: External ear normal.     Left Ear: External ear normal.     Nose: Nose normal.     Mouth/Throat:     Pharynx: No oropharyngeal exudate.  Eyes:     General: No scleral icterus.    Conjunctiva/sclera: Conjunctivae normal.     Pupils: Pupils are equal, round, and reactive to light.  Neck:     Thyroid: No thyromegaly.  Cardiovascular:     Rate and Rhythm: Normal rate and regular rhythm.     Heart sounds: Normal heart sounds.  Pulmonary:     Effort: Pulmonary effort is normal. No respiratory distress.     Breath sounds: Normal breath sounds.  Abdominal:     General: Bowel sounds are normal.     Palpations: Abdomen is soft. There is no mass.     Tenderness: There is no abdominal tenderness.  Musculoskeletal:        General: No swelling, tenderness or deformity. Normal range of motion.     Cervical back: Normal range of motion and neck supple.  Lymphadenopathy:     Cervical: No cervical adenopathy.  Skin:    General: Skin is warm and dry.     Findings: No rash.  Neurological:     Mental Status: She is alert and oriented to person, place, and time.     Cranial Nerves: No cranial nerve deficit.  Psychiatric:        Behavior: Behavior normal.     Results for orders placed or performed in visit on 01/22/20  Lipid Panel w/o Chol/HDL Ratio  Result Value Ref Range   Cholesterol, Total 145 100 - 199 mg/dL   Triglycerides 117 0 - 149 mg/dL   HDL 39 (L) >39 mg/dL   VLDL Cholesterol Cal 21 5 - 40 mg/dL   LDL Chol Calc (NIH) 85 0 - 99 mg/dL  Comprehensive metabolic panel  Result Value Ref Range   Glucose 99 65 - 99 mg/dL   BUN 10 8 - 27 mg/dL   Creatinine, Ser 0.80 0.57 - 1.00 mg/dL   GFR calc non Af Amer 78 >59  mL/min/1.73   GFR calc Af Amer 89 >59 mL/min/1.73   BUN/Creatinine Ratio 13 12 - 28   Sodium 141 134 - 144 mmol/L   Potassium 4.4 3.5 - 5.2 mmol/L   Chloride 105 96 - 106 mmol/L   CO2 22 20 - 29 mmol/L   Calcium 9.6 8.7 - 10.3 mg/dL   Total Protein 6.9 6.0 - 8.5 g/dL   Albumin 4.5 3.8 - 4.8 g/dL   Globulin, Total 2.4 1.5 - 4.5 g/dL   Albumin/Globulin Ratio 1.9 1.2 - 2.2   Bilirubin Total 0.3 0.0 - 1.2 mg/dL   Alkaline Phosphatase 118 (H) 39 - 117 IU/L   AST 10 0 - 40 IU/L   ALT 12 0 - 32 IU/L      Assessment & Plan:   Problem List Items Addressed This Visit      Cardiovascular and Mediastinum   Hypertension - Primary    BPs stable and WNL, continue present medications, DASH diet      Relevant Medications   atorvastatin (LIPITOR) 20 MG tablet   lisinopril (ZESTRIL) 20 MG tablet   Other Relevant Orders   CBC with Differential/Platelet   Comprehensive metabolic panel   TSH   UA/M w/rflx Culture, Routine     Digestive   GERD (gastroesophageal reflux disease)    Stable and well controlled, continue PPI therapy and diet modifications      Relevant Medications   omeprazole (PRILOSEC) 20 MG capsule     Other   Hyperlipidemia, unspecified    Recheck lipids, adjust as needed. Continue lipitor and lifestyle modifications      Relevant Medications   atorvastatin (LIPITOR) 20 MG tablet   lisinopril (ZESTRIL) 20 MG tablet   Other Relevant Orders   Lipid Panel w/o Chol/HDL Ratio   Anxiety    Chronic, stable and well controlled. Continue present medications      Relevant Medications   FLUoxetine (PROZAC) 40 MG capsule    Other Visit Diagnoses    Annual physical exam       Chronic pain of left thumb       Prednisone taper, voltaren gel, bracing, home exercises. F/u with Orthopedics if not improving. Declines referral for now   Relevant Medications   predniSONE (DELTASONE) 10 MG tablet   FLUoxetine (PROZAC) 40 MG capsule   Encounter for screening mammogram for  malignant neoplasm of breast       Relevant Orders   MM DIGITAL SCREENING BILATERAL       Follow up plan: Return in about 6 months (around 01/21/2021) for 6 month f/u.   LABORATORY TESTING:  - Pap smear: not applicable  IMMUNIZATIONS:   - Tdap: Tetanus vaccination status reviewed: last tetanus booster within 10 years. - Influenza: allergy contraindication -  Pneumovax: Refused - Prevnar: Refused  SCREENING: -Mammogram: Ordered today  - Colonoscopy: Up to date  - Bone Density: Up to date   PATIENT COUNSELING:   Advised to take 1 mg of folate supplement per day if capable of pregnancy.   Sexuality: Discussed sexually transmitted diseases, partner selection, use of condoms, avoidance of unintended pregnancy  and contraceptive alternatives.   Advised to avoid cigarette smoking.  I discussed with the patient that most people either abstain from alcohol or drink within safe limits (<=14/week and <=4 drinks/occasion for males, <=7/weeks and <= 3 drinks/occasion for females) and that the risk for alcohol disorders and other health effects rises proportionally with the number of drinks per week and how often a drinker exceeds daily limits.  Discussed cessation/primary prevention of drug use and availability of treatment for abuse.   Diet: Encouraged to adjust caloric intake to maintain  or achieve ideal body weight, to reduce intake of dietary saturated fat and total fat, to limit sodium intake by avoiding high sodium foods and not adding table salt, and to maintain adequate dietary potassium and calcium preferably from fresh fruits, vegetables, and low-fat dairy products.    stressed the importance of regular exercise  Injury prevention: Discussed safety belts, safety helmets, smoke detector, smoking near bedding or upholstery.   Dental health: Discussed importance of regular tooth brushing, flossing, and dental visits.    NEXT PREVENTATIVE PHYSICAL DUE IN 1 YEAR. Return in about 6  months (around 01/21/2021) for 6 month f/u.

## 2020-07-21 NOTE — Assessment & Plan Note (Signed)
BPs stable and WNL, continue present medications, DASH diet

## 2020-07-21 NOTE — Assessment & Plan Note (Signed)
Recheck lipids, adjust as needed. Continue lipitor and lifestyle modifications. 

## 2020-07-21 NOTE — Progress Notes (Signed)
   Covid-19 Vaccination Clinic  Name:  Valerie Donovan    MRN: [id number] DOB: [birth year]  07/21/2020  [name] was observed post Covid-19 immunization for 30 minutes based on pre-vaccination screening without incident. She was provided with Vaccine Information Sheet and instruction to access the V-Safe system.   [name] was instructed to call 911 with any severe reactions post vaccine: [name] Difficulty breathing  . Swelling of face and throat  . A fast heartbeat  . A bad rash all over body  . Dizziness and weakness   Immunizations Administered    Name Date Dose VIS Date Route   Pfizer COVID-19 Vaccine 07/21/2020  2:20 PM 0.3 mL 01/30/2019 Intramuscular   Manufacturer: [place]   Lot: [id number]   [place]: [id number]

## 2020-07-21 NOTE — Patient Instructions (Signed)
Please call at this number [facility]) to schedule your mammogram. [phone]

## 2020-07-22 LAB — CBC WITH DIFFERENTIAL/PLATELET
Basophils Absolute: 0 10*3/uL (ref 0.0–0.2)
Basos: 0 %
EOS (ABSOLUTE): 0.2 10*3/uL (ref 0.0–0.4)
Eos: 2 %
Hematocrit: 42.2 % (ref 34.0–46.6)
Hemoglobin: 14.2 g/dL (ref 11.1–15.9)
Immature Grans (Abs): 0 10*3/uL (ref 0.0–0.1)
Immature Granulocytes: 0 %
Lymphocytes Absolute: 2.4 10*3/uL (ref 0.7–3.1)
Lymphs: 25 %
MCH: 31.6 pg (ref 26.6–33.0)
MCHC: 33.6 g/dL (ref 31.5–35.7)
MCV: 94 fL (ref 79–97)
Monocytes Absolute: 0.6 10*3/uL (ref 0.1–0.9)
Monocytes: 7 %
Neutrophils Absolute: 6.3 10*3/uL (ref 1.4–7.0)
Neutrophils: 66 %
Platelets: 282 10*3/uL (ref 150–450)
RBC: 4.49 x10E6/uL (ref 3.77–5.28)
RDW: 12.2 % (ref 11.7–15.4)
WBC: 9.5 10*3/uL (ref 3.4–10.8)

## 2020-07-22 LAB — TSH: TSH: 2.96 u[IU]/mL (ref 0.450–4.500)

## 2020-07-22 LAB — LIPID PANEL W/O CHOL/HDL RATIO
Cholesterol, Total: 149 mg/dL (ref 100–199)
HDL: 41 mg/dL (ref >39)
LDL Chol Calc (NIH): 81 mg/dL (ref 0–99)
Triglycerides: 156 mg/dL — ABNORMAL HIGH (ref 0–149)
VLDL Cholesterol Cal: 27 mg/dL (ref 5–40)

## 2020-07-22 LAB — COMPREHENSIVE METABOLIC PANEL
ALT: 11 IU/L (ref 0–32)
AST: 12 IU/L (ref 0–40)
Albumin/Globulin Ratio: 1.8 (ref 1.2–2.2)
Albumin: 4.5 g/dL (ref 3.8–4.8)
Alkaline Phosphatase: 113 IU/L (ref 48–121)
BUN/Creatinine Ratio: 14 (ref 12–28)
BUN: 10 mg/dL (ref 8–27)
Bilirubin Total: 0.3 mg/dL (ref 0.0–1.2)
CO2: 25 mmol/L (ref 20–29)
Calcium: 9.8 mg/dL (ref 8.7–10.3)
Chloride: 105 mmol/L (ref 96–106)
Creatinine, Ser: 0.73 mg/dL (ref 0.57–1.00)
GFR calc Af Amer: 99 mL/min/{1.73_m2} (ref >59)
GFR calc non Af Amer: 86 mL/min/{1.73_m2} (ref >59)
Globulin, Total: 2.5 g/dL (ref 1.5–4.5)
Glucose: 87 mg/dL (ref 65–99)
Potassium: 4 mmol/L (ref 3.5–5.2)
Sodium: 144 mmol/L (ref 134–144)
Total Protein: 7 g/dL (ref 6.0–8.5)

## 2020-08-07 DIAGNOSIS — Z03818 Encounter for observation for suspected exposure to other biological agents ruled out: Secondary | ICD-10-CM | POA: Diagnosis not present

## 2020-08-07 DIAGNOSIS — Z20822 Contact with and (suspected) exposure to covid-19: Secondary | ICD-10-CM | POA: Diagnosis not present

## 2020-08-18 ENCOUNTER — Ambulatory Visit: Payer: PPO

## 2020-08-18 ENCOUNTER — Ambulatory Visit: Payer: PPO | Attending: Critical Care Medicine

## 2020-08-18 DIAGNOSIS — Z23 Encounter for immunization: Secondary | ICD-10-CM

## 2020-08-18 NOTE — Progress Notes (Signed)
   Covid-19 Vaccination Clinic  Name:  Valerie Donovan    MRN: [id number] DOB: 16-Oct-1956  08/18/2020  [name] was observed post Covid-19 immunization for 30 minutes based on pre-vaccination screening without incident. She was provided with Vaccine Information Sheet and instruction to access the V-Safe system.   [name] was instructed to call 911 with any severe reactions post vaccine: [name] Difficulty breathing  . Swelling of face and throat  . A fast heartbeat  . A bad rash all over body  . Dizziness and weakness   Immunizations Administered    Name Date Dose VIS Date Route   Pfizer COVID-19 Vaccine 08/18/2020  2:02 PM 0.3 mL 01/30/2019 Intramuscular   Manufacturer: [place]   Lot: [id number]   [place]: [id number]

## 2020-09-10 ENCOUNTER — Telehealth: Payer: Self-pay | Admitting: Family Medicine

## 2020-09-10 NOTE — Telephone Encounter (Signed)
Copied from [place] [id number]. Topic: Medicare AWV >> Sep 10, 2020  3:19 PM Cher Nakai R wrote: Reason for CRM:   Left message for patient to call back and schedule Medicare Annual Wellness Visit (AWV) to be done virtually.  No hx of AWV eligible as of 04/05/2020 awvi  Please schedule at anytime with [facility] Health Advisor.      41 Minutes appointment   Any questions, please call me at [phone]

## 2020-10-17 ENCOUNTER — Other Ambulatory Visit: Payer: Self-pay | Admitting: Family Medicine

## 2020-11-20 ENCOUNTER — Telehealth: Payer: Self-pay | Admitting: Family Medicine

## 2020-11-20 NOTE — Telephone Encounter (Signed)
Copied from [place] [id number]. Topic: Medicare AWV >> Nov 20, 2020  1:58 PM Cher Nakai R wrote: Reason for CRM:  Left message for patient to call back and schedule Medicare Annual Wellness Visit (AWV) to be done virtually.  No hx of AWV eligible as of 04/05/2020  Please schedule at anytime with [facility] Health Advisor.      86 Minutes appointment   Any questions, please call me at [phone]

## 2020-12-19 ENCOUNTER — Ambulatory Visit (INDEPENDENT_AMBULATORY_CARE_PROVIDER_SITE_OTHER): Payer: PPO

## 2020-12-19 ENCOUNTER — Telehealth: Payer: Self-pay

## 2020-12-19 VITALS — Ht 63.0 in | Wt 137.0 lb

## 2020-12-19 DIAGNOSIS — Z0000 Encounter for general adult medical examination without abnormal findings: Secondary | ICD-10-CM

## 2020-12-19 NOTE — Progress Notes (Signed)
I connected with Valerie Donovan today by telephone and verified that I am speaking with the correct person using two identifiers. Location patient: home Location provider: work Persons participating in the virtual visit: Valerie Donovan, Valerie Donovan.   I discussed the limitations, risks, security and privacy concerns of performing an evaluation and management service by telephone and the availability of in person appointments. I also discussed with the patient that there may be a patient responsible charge related to this service. The patient expressed understanding and verbally consented to this telephonic visit.    Interactive audio and video telecommunications were attempted between this provider and patient, however failed, due to patient having technical difficulties OR patient did not have access to video capability.  We continued and completed visit with audio only.     Vital signs may be patient reported or missing.  Subjective:   Valerie Donovan is a 65 y.o. female who presents for an Initial Medicare Annual Wellness Visit.  Review of Systems     Cardiac Risk Factors include: advanced age (>27men, >24 women);dyslipidemia;hypertension;smoking/ tobacco exposure     Objective:    Today's Vitals   12/19/20 1341 12/19/20 1343  Weight: 137 lb (62.1 kg)   Height: 5\' 3"  (1.6 m)   PainSc:  3    Body mass index is 24.27 kg/m.  Advanced Directives 12/19/2020 07/20/2019 06/15/2018 06/07/2018  Does Patient Have a Medical Advance Directive? No No No No  Would patient like information on creating a medical advance directive? - No - Patient declined No - Patient declined -    Current Medications (verified) Outpatient Encounter Medications as of 12/19/2020  Medication Sig  . atorvastatin (LIPITOR) 20 MG tablet TAKE 1 TABLET(20 MG) BY MOUTH EVERY MORNING  . EPINEPHrine (EPIPEN 2-PAK) 0.3 mg/0.3 mL IJ SOAJ injection Inject 0.3 mLs (0.3 mg total) into the muscle as needed for  anaphylaxis.  [name] FLUoxetine (PROZAC) 40 MG capsule TAKE 1 CAPSULE(40 MG) BY MOUTH DAILY  . lisinopril (ZESTRIL) 20 MG tablet TAKE 1 TABLET(20 MG) BY MOUTH DAILY  . omeprazole (PRILOSEC) 20 MG capsule Take 1 capsule (20 mg total) by mouth daily as needed (for acid reflux/indigestion.).  [name] predniSONE (DELTASONE) 10 MG tablet Take 6 tabs day one, 5 tabs day two, 4 tabs day three, etc (Patient not taking: Reported on 12/19/2020)   No facility-administered encounter medications on file as of 12/19/2020.    Allergies (verified) Ciprofloxacin, Clarithromycin, Other, Penicillin g, and Codeine   History: Past Medical History:  Diagnosis Date  . Anxiety   . Arthritis    FINGERS  . GERD (gastroesophageal reflux disease)   . Hyperlipidemia   . Hypertension   . Postmenopausal bleeding 2014; 10/202018; 05/14/18   endometrial bx done 09/2015 - nl; nl paps   Past Surgical History:  Procedure Laterality Date  . COLONOSCOPY WITH PROPOFOL N/A 07/20/2019   Procedure: COLONOSCOPY WITH PROPOFOL;  Surgeon: Lucilla Lame, MD;  Location: [facility];  Service: Endoscopy;  Laterality: N/A;  . HYSTEROSCOPY WITH D & C N/A 06/15/2018   Procedure: DILATATION AND CURETTAGE /HYSTEROSCOPY;  Surgeon: Homero Fellers, MD;  Location: [facility];  Service: Gynecology;  Laterality: N/A;  . POLYPECTOMY  07/20/2019   Procedure: POLYPECTOMY;  Surgeon: Lucilla Lame, MD;  Location: [facility];  Service: Endoscopy;;  . TUBAL LIGATION    . WISDOM TOOTH EXTRACTION     Family History  Problem Relation Age of Onset  . Cancer Mother   . Diabetes Mother   .  Cancer Father        colon  . Diabetes Sister   . Diabetes Maternal Grandmother   . Diabetes Maternal Grandfather   . Diabetes Sister    Social History   Socioeconomic History  . Marital status: Divorced    Spouse name: Not on file  . Number of children: Not on file  . Years of education: Not on file  . Highest education level: Not on file   Occupational History  . Occupation: retired  Tobacco Use  . Smoking status: Current Every Day Smoker    Packs/day: 0.50    Years: 44.00    Pack years: 22.00    Types: Cigarettes  . Smokeless tobacco: Never Used  Vaping Use  . Vaping Use: Former  Substance and Sexual Activity  . Alcohol use: No  . Drug use: No    Comment: CBD OIL IN JUUL  . Sexual activity: Not Currently    Birth control/protection: Post-menopausal  Other Topics Concern  . Not on file  Social History Narrative  . Not on file   Social Determinants of Health   Financial Resource Strain: Low Risk   . Difficulty of Paying Living Expenses: Not hard at all  Food Insecurity: No Food Insecurity  . Worried About Charity fundraiser in the Last Year: Never true  . Ran Out of Food in the Last Year: Never true  Transportation Needs: No Transportation Needs  . Lack of Transportation (Medical): No  . Lack of Transportation (Non-Medical): No  Physical Activity: Sufficiently Active  . Days of Exercise per Week: 7 days  . Minutes of Exercise per Session: 60 min  Stress: No Stress Concern Present  . Feeling of Stress : Not at all  Social Connections: Not on file    Tobacco Counseling Ready to quit: Not Answered Counseling given: Not Answered   Clinical Intake:  Pre-visit preparation completed: Yes  Pain : No/denies pain Pain Score: 3  Pain Type: Chronic pain Pain Location:  (thumb) Pain Orientation: Right,Left Pain Descriptors / Indicators: Cramping Pain Onset: More than a month ago Pain Frequency: Constant Pain Relieving Factors: voltaren gel, ibuprofen helps  Pain Relieving Factors: voltaren gel, ibuprofen helps  Nutritional Status: BMI of 19-24  Normal Nutritional Risks: None Diabetes: No  How often do you need to have someone help you when you read instructions, pamphlets, or other written materials from your doctor or pharmacy?: 1 - Never What is the last grade level you completed in school?:  12th grade  Diabetic? no  Interpreter Needed?: No  Information entered by :: Valerie Donovan   Activities of Daily Living In your present state of health, do you have any difficulty performing the following activities: 12/19/2020 07/21/2020  Hearing? N N  Vision? N N  Difficulty concentrating or making decisions? N N  Walking or climbing stairs? N N  Dressing or bathing? N N  Doing errands, shopping? N N  Preparing Food and eating ? N -  Using the Toilet? N -  In the past six months, have you accidently leaked urine? Y -  Comment with hard sneezes -  Do you have problems with loss of bowel control? N -  Managing your Medications? Y -  Managing your Finances? N -  Housekeeping or managing your Housekeeping? N -  Some recent data might be hidden    Patient Care Team: Volney American, PA-C as PCP - General (Family Medicine)  Indicate any recent Medical Services you may  have received from other than [facility] providers in the past year (date may be approximate).     Assessment:   This is a routine wellness examination for Valerie Donovan.  Hearing/Vision screen  Hearing Screening   125Hz  250Hz  500Hz  1000Hz  2000Hz  3000Hz  4000Hz  6000Hz  8000Hz   Right ear:           Left ear:           Vision Screening Comments: Regular eye exams, [place]  Dietary issues and exercise activities discussed: Current Exercise Habits: Home exercise routine, Type of exercise: walking, Time (Minutes): 60, Frequency (Times/Week): 7, Weekly Exercise (Minutes/Week): 420  Goals    . Patient Stated     12/19/2020, more exercise      Depression Screen PHQ 2/9 Scores 12/19/2020 07/21/2020 01/07/2020 12/27/2018 06/21/2018 11/11/2017  PHQ - 2 Score 0 2 0 2 0 3  PHQ- 9 Score - 3 0 3 0 6    Fall Risk Fall Risk  12/19/2020 07/21/2020 12/31/2018 12/27/2018  Falls in the past year? 0 0 0 0  Number falls in past yr: - 0 - -  Injury with Fall? - 0 - -  Risk for fall due to : Medication side effect - - -  Follow  up Falls evaluation completed;Education provided;Falls prevention discussed - Falls evaluation completed;Falls prevention discussed Falls evaluation completed    FALL RISK PREVENTION PERTAINING TO THE HOME:  Any stairs in or around the home? Yes  If so, are there any without handrails? No  Home free of loose throw rugs in walkways, pet beds, electrical cords, etc? Yes  Adequate lighting in your home to reduce risk of falls? Yes   ASSISTIVE DEVICES UTILIZED TO PREVENT FALLS:  Life alert? No  Use of a cane, walker or w/c? No  Grab bars in the bathroom? Yes  Shower chair or bench in shower? No  Elevated toilet seat or a handicapped toilet? Yes   TIMED UP AND GO:  Was the test performed? No .   Cognitive Function:     6CIT Screen 12/19/2020  What Year? 0 points  What month? 0 points  What time? 0 points  Count back from 20 0 points  Months in reverse 0 points  Repeat phrase 0 points  Total Score 0    Immunizations Immunization History  Administered Date(s) Administered  . PFIZER SARS-COV-2 Vaccination 07/21/2020, 08/18/2020  . Tdap 09/04/2015    TDAP status: Up to date  Flu Vaccine status: Declined, Education has been provided regarding the importance of this vaccine but patient still declined. Advised may receive this vaccine at local pharmacy or Health Dept. Aware to provide a copy of the vaccination record if obtained from local pharmacy or Health Dept. Verbalized acceptance and understanding.  Pneumococcal vaccine status: Declined,  Education has been provided regarding the importance of this vaccine but patient still declined. Advised may receive this vaccine at local pharmacy or Health Dept. Aware to provide a copy of the vaccination record if obtained from local pharmacy or Health Dept. Verbalized acceptance and understanding.   Covid-19 vaccine status: Completed vaccines  Qualifies for Shingles Vaccine? Yes   Zostavax completed No   Shingrix Completed?: No.     Education has been provided regarding the importance of this vaccine. Patient has been advised to call insurance company to determine out of pocket expense if they have not yet received this vaccine. Advised may also receive vaccine at local pharmacy or Health Dept. Verbalized acceptance and understanding.  Screening Tests Health Maintenance  Topic Date Due  . MAMMOGRAM  01/26/2020  . INFLUENZA VACCINE  03/05/2021 (Originally 07/06/2020)  . PNA vac Low Risk Adult (1 of 2 - PCV13) 07/21/2021 (Originally 05/05/2019)  . COVID-19 Vaccine (3 - Booster for Pfizer series) 02/15/2021  . COLONOSCOPY (Pts 45-49yrs Insurance coverage will need to be confirmed)  07/19/2024  . TETANUS/TDAP  09/03/2025  . DEXA SCAN  Completed  . Hepatitis C Screening  Completed    Health Maintenance  Health Maintenance Due  Topic Date Due  . MAMMOGRAM  01/26/2020    Colorectal cancer screening: Type of screening: Colonoscopy. Completed 07/20/2019. Repeat every 5 years  Mammogram status: patient to schedule  Bone Density status: Completed 09/17/2009.   Lung Cancer Screening: (Low Dose CT Chest recommended if Age 74-80 years, 30 pack-year currently smoking OR have quit w/in 15years.) does not qualify.   Lung Cancer Screening Referral: no  Additional Screening:  Hepatitis C Screening: does qualify; Completed 11/11/2017  Vision Screening: Recommended annual ophthalmology exams for early detection of glaucoma and other disorders of the eye. Is the patient up to date with their annual eye exam?  No  Who is the provider or what is the name of the office in which the patient attends annual eye exams? [facility] If pt is not established with a provider, would they like to be referred to a provider to establish care? No .   Dental Screening: Recommended annual dental exams for proper oral hygiene  Community Resource Referral / Chronic Care Management: CRR required this visit?  No   CCM required this visit?   No      Plan:     I have personally reviewed and noted the following in the patient's chart:   . Medical and social history . Use of alcohol, tobacco or illicit drugs  . Current medications and supplements . Functional ability and status . Nutritional status . Physical activity . Advanced directives . List of other physicians . Hospitalizations, surgeries, and ER visits in previous 12 months . Vitals . Screenings to include cognitive, depression, and falls . Referrals and appointments  In addition, I have reviewed and discussed with patient certain preventive protocols, quality metrics, and best practice recommendations. A written personalized care plan for preventive services as well as general preventive health recommendations were provided to patient.     Kellie Simmering, Donovan   1/61/0960   Nurse Notes:

## 2020-12-19 NOTE — Patient Instructions (Signed)
Valerie Donovan , Thank you for taking time to come for your Medicare Wellness Visit. I appreciate your ongoing commitment to your health goals. Please review the following plan we discussed and let me know if I can assist you in the future.   Screening recommendations/referrals: Colonoscopy: completed 07/20/2019, due 07/19/2024 Mammogram: patient to schedule Bone Density: completed 09/17/2009 Recommended yearly ophthalmology/optometry visit for glaucoma screening and checkup Recommended yearly dental visit for hygiene and checkup  Vaccinations: Influenza vaccine: decline Pneumococcal vaccine: decline Tdap vaccine: completed 09/04/2015, due 09/03/2025 Shingles vaccine: decline   Covid-19: 08/18/2020, 07/21/2020  Advanced directives: Advance directive discussed with you today.   Conditions/risks identified: smoking  Next appointment: Follow up in one year for your annual wellness visit    Preventive Care 77 Years and Older, Female Preventive care refers to lifestyle choices and visits with your health care provider that can promote health and wellness. What does preventive care include?  A yearly physical exam. This is also called an annual well check.  Dental exams once or twice a year.  Routine eye exams. Ask your health care provider how often you should have your eyes checked.  Personal lifestyle choices, including:  Daily care of your teeth and gums.  Regular physical activity.  Eating a healthy diet.  Avoiding tobacco and drug use.  Limiting alcohol use.  Practicing safe sex.  Taking low-dose aspirin every day.  Taking vitamin and mineral supplements as recommended by your health care provider. What happens during an annual well check? The services and screenings done by your health care provider during your annual well check will depend on your age, overall health, lifestyle risk factors, and family history of disease. Counseling  Your health care provider may ask you  questions about your:  Alcohol use.  Tobacco use.  Drug use.  Emotional well-being.  Home and relationship well-being.  Sexual activity.  Eating habits.  History of falls.  Memory and ability to understand (cognition).  Work and work Statistician.  Reproductive health. Screening  You may have the following tests or measurements:  Height, weight, and BMI.  Blood pressure.  Lipid and cholesterol levels. These may be checked every 5 years, or more frequently if you are over 34 years old.  Skin check.  Lung cancer screening. You may have this screening every year starting at age 35 if you have a 30-pack-year history of smoking and currently smoke or have quit within the past 15 years.  Fecal occult blood test (FOBT) of the stool. You may have this test every year starting at age 34.  Flexible sigmoidoscopy or colonoscopy. You may have a sigmoidoscopy every 5 years or a colonoscopy every 10 years starting at age 72.  Hepatitis C blood test.  Hepatitis B blood test.  Sexually transmitted disease (STD) testing.  Diabetes screening. This is done by checking your blood sugar (glucose) after you have not eaten for a while (fasting). You may have this done every 1-3 years.  Bone density scan. This is done to screen for osteoporosis. You may have this done starting at age 85.  Mammogram. This may be done every 1-2 years. Talk to your health care provider about how often you should have regular mammograms. Talk with your health care provider about your test results, treatment options, and if necessary, the need for more tests. Vaccines  Your health care provider may recommend certain vaccines, such as:  Influenza vaccine. This is recommended every year.  Tetanus, diphtheria, and acellular pertussis (Tdap,  Td) vaccine. You may need a Td booster every 10 years.  Zoster vaccine. You may need this after age 41.  Pneumococcal 13-valent conjugate (PCV13) vaccine. One dose is  recommended after age 96.  Pneumococcal polysaccharide (PPSV23) vaccine. One dose is recommended after age 6. Talk to your health care provider about which screenings and vaccines you need and how often you need them. This information is not intended to replace advice given to you by your health care provider. Make sure you discuss any questions you have with your health care provider. Document Released: 12/19/2015 Document Revised: 08/11/2016 Document Reviewed: 09/23/2015 Elsevier Interactive Patient Education  2017 [place] Prevention in the Home Falls can cause injuries. They can happen to people of all ages. There are many things you can do to make your home safe and to help prevent falls. What can I do on the outside of my home?  Regularly fix the edges of walkways and driveways and fix any cracks.  Remove anything that might make you trip as you walk through a door, such as a raised step or threshold.  Trim any bushes or trees on the path to your home.  Use bright outdoor lighting.  Clear any walking paths of anything that might make someone trip, such as rocks or tools.  Regularly check to see if handrails are loose or broken. Make sure that both sides of any steps have handrails.  Any raised decks and porches should have guardrails on the edges.  Have any leaves, snow, or ice cleared regularly.  Use sand or salt on walking paths during winter.  Clean up any spills in your garage right away. This includes oil or grease spills. What can I do in the bathroom?  Use night lights.  Install grab bars by the toilet and in the tub and shower. Do not use towel bars as grab bars.  Use non-skid mats or decals in the tub or shower.  If you need to sit down in the shower, use a plastic, non-slip stool.  Keep the floor dry. Clean up any water that spills on the floor as soon as it happens.  Remove soap buildup in the tub or shower regularly.  Attach bath mats  securely with double-sided non-slip rug tape.  Do not have throw rugs and other things on the floor that can make you trip. What can I do in the bedroom?  Use night lights.  Make sure that you have a light by your bed that is easy to reach.  Do not use any sheets or blankets that are too big for your bed. They should not hang down onto the floor.  Have a firm chair that has side arms. You can use this for support while you get dressed.  Do not have throw rugs and other things on the floor that can make you trip. What can I do in the kitchen?  Clean up any spills right away.  Avoid walking on wet floors.  Keep items that you use a lot in easy-to-reach places.  If you need to reach something above you, use a strong step stool that has a grab bar.  Keep electrical cords out of the way.  Do not use floor polish or wax that makes floors slippery. If you must use wax, use non-skid floor wax.  Do not have throw rugs and other things on the floor that can make you trip. What can I do with my stairs?  Do not  leave any items on the stairs.  Make sure that there are handrails on both sides of the stairs and use them. Fix handrails that are broken or loose. Make sure that handrails are as long as the stairways.  Check any carpeting to make sure that it is firmly attached to the stairs. Fix any carpet that is loose or worn.  Avoid having throw rugs at the top or bottom of the stairs. If you do have throw rugs, attach them to the floor with carpet tape.  Make sure that you have a light switch at the top of the stairs and the bottom of the stairs. If you do not have them, ask someone to add them for you. What else can I do to help prevent falls?  Wear shoes that:  Do not have high heels.  Have rubber bottoms.  Are comfortable and fit you well.  Are closed at the toe. Do not wear sandals.  If you use a stepladder:  Make sure that it is fully opened. Do not climb a closed  stepladder.  Make sure that both sides of the stepladder are locked into place.  Ask someone to hold it for you, if possible.  Clearly mark and make sure that you can see:  Any grab bars or handrails.  First and last steps.  Where the edge of each step is.  Use tools that help you move around (mobility aids) if they are needed. These include:  Canes.  Walkers.  Scooters.  Crutches.  Turn on the lights when you go into a dark area. Replace any light bulbs as soon as they burn out.  Set up your furniture so you have a clear path. Avoid moving your furniture around.  If any of your floors are uneven, fix them.  If there are any pets around you, be aware of where they are.  Review your medicines with your doctor. Some medicines can make you feel dizzy. This can increase your chance of falling. Ask your doctor what other things that you can do to help prevent falls. This information is not intended to replace advice given to you by your health care provider. Make sure you discuss any questions you have with your health care provider. Document Released: 09/18/2009 Document Revised: 04/29/2016 Document Reviewed: 12/27/2014 Elsevier Interactive Patient Education  2017 [employer].

## 2020-12-19 NOTE — Telephone Encounter (Signed)
Patient consented to telehealth visit. 

## 2021-01-16 ENCOUNTER — Other Ambulatory Visit: Payer: Self-pay | Admitting: Family Medicine

## 2021-01-16 ENCOUNTER — Other Ambulatory Visit: Payer: Self-pay | Admitting: Nurse Practitioner

## 2021-01-16 NOTE — Telephone Encounter (Signed)
Future visit in 5 days  

## 2021-01-21 ENCOUNTER — Ambulatory Visit (INDEPENDENT_AMBULATORY_CARE_PROVIDER_SITE_OTHER): Payer: PPO | Admitting: Nurse Practitioner

## 2021-01-21 ENCOUNTER — Other Ambulatory Visit: Payer: Self-pay

## 2021-01-21 ENCOUNTER — Encounter: Payer: Self-pay | Admitting: Nurse Practitioner

## 2021-01-21 VITALS — BP 138/67 | HR 67 | Temp 98.8°F | Wt 138.2 lb

## 2021-01-21 DIAGNOSIS — E538 Deficiency of other specified B group vitamins: Secondary | ICD-10-CM | POA: Diagnosis not present

## 2021-01-21 DIAGNOSIS — K219 Gastro-esophageal reflux disease without esophagitis: Secondary | ICD-10-CM

## 2021-01-21 DIAGNOSIS — F419 Anxiety disorder, unspecified: Secondary | ICD-10-CM | POA: Diagnosis not present

## 2021-01-21 DIAGNOSIS — E559 Vitamin D deficiency, unspecified: Secondary | ICD-10-CM

## 2021-01-21 DIAGNOSIS — I10 Essential (primary) hypertension: Secondary | ICD-10-CM

## 2021-01-21 DIAGNOSIS — E7800 Pure hypercholesterolemia, unspecified: Secondary | ICD-10-CM

## 2021-01-21 DIAGNOSIS — F17200 Nicotine dependence, unspecified, uncomplicated: Secondary | ICD-10-CM | POA: Diagnosis not present

## 2021-01-21 MED ORDER — LISINOPRIL 20 MG PO TABS: ORAL_TABLET | ORAL | 1 refills | Status: DC

## 2021-01-21 NOTE — Assessment & Plan Note (Signed)
Not currently on supplementation, will check levels and restart if needed. Discussed good dietary sources and lots of sunlight.

## 2021-01-21 NOTE — Assessment & Plan Note (Signed)
Stable.  Not ready to quit.  Declines CT lung.

## 2021-01-21 NOTE — Assessment & Plan Note (Signed)
Not currently on supplementation, will recheck levels and restart if needed.

## 2021-01-21 NOTE — Assessment & Plan Note (Signed)
Chronic, stable and well controlled. Continue present medications.

## 2021-01-21 NOTE — Progress Notes (Signed)
BP 138/67   Pulse 67   Temp 98.8 F (37.1 C)   Wt 138 lb 3.2 oz (62.7 kg)   LMP  (LMP Unknown)   SpO2 99%   BMI 24.48 kg/m    Subjective:    Patient ID: Valerie Donovan, female    DOB: 09-24-1956, 65 y.o.   MRN: [id number]  HPI: Valerie Donovan is a 65 y.o. female  Chief Complaint  Patient presents with  . Gastroesophageal Reflux  . Hypertension  . Anxiety  . Hyperlipidemia   HYPERTENSION / HYPERLIPIDEMIA Satisfied with current treatment? yes Duration of hypertension: years BP monitoring frequency: rarely BP range: 140/80 only checks when she is mad BP medication side effects: no Past BP meds: lisinopril Duration of hyperlipidemia: years Cholesterol medication side effects: yes Cholesterol supplements: none Past cholesterol medications: atorvastain (lipitor) Medication compliance: excellent compliance Aspirin: no Recent stressors: no Recurrent headaches: no Visual changes: no Palpitations: no Dyspnea: no Chest pain: no Lower extremity edema: no Dizzy/lightheaded: no  ANXIETY Well controlled on Fluoxitine.  Current everyday smoker.  Not ready to quit.  Relevant past medical, surgical, family and social history reviewed and updated as indicated. Interim medical history since our last visit reviewed. Allergies and medications reviewed and updated.  Review of Systems  Eyes: Negative for visual disturbance.  Respiratory: Negative for cough, chest tightness and shortness of breath.   Cardiovascular: Negative for chest pain, palpitations and leg swelling.  Neurological: Negative for dizziness and headaches.    Per HPI unless specifically indicated above     Objective:    BP 138/67   Pulse 67   Temp 98.8 F (37.1 C)   Wt 138 lb 3.2 oz (62.7 kg)   LMP  (LMP Unknown)   SpO2 99%   BMI 24.48 kg/m   Wt Readings from Last 3 Encounters:  01/21/21 138 lb 3.2 oz (62.7 kg)  12/19/20 137 lb (62.1 kg)  07/21/20 141 lb (64 kg)    Physical Exam Vitals  and nursing note reviewed.  Constitutional:      General: She is not in acute distress.    Appearance: Normal appearance. She is normal weight. She is not ill-appearing, toxic-appearing or diaphoretic.  HENT:     Head: Normocephalic.     Right Ear: External ear normal.     Left Ear: External ear normal.     Nose: Nose normal.     Mouth/Throat:     Mouth: Mucous membranes are moist.     Pharynx: Oropharynx is clear.  Eyes:     General:        Right eye: No discharge.        Left eye: No discharge.     Extraocular Movements: Extraocular movements intact.     Conjunctiva/sclera: Conjunctivae normal.     Pupils: Pupils are equal, round, and reactive to light.  Cardiovascular:     Rate and Rhythm: Normal rate and regular rhythm.     Heart sounds: No murmur heard.   Pulmonary:     Effort: Pulmonary effort is normal. No respiratory distress.     Breath sounds: Normal breath sounds. No wheezing or rales.  Musculoskeletal:     Cervical back: Normal range of motion and neck supple.  Skin:    General: Skin is warm and dry.     Capillary Refill: Capillary refill takes less than 2 seconds.  Neurological:     General: No focal deficit present.     Mental Status: She  is alert and oriented to person, place, and time. Mental status is at baseline.  Psychiatric:        Mood and Affect: Mood normal.        Behavior: Behavior normal.        Thought Content: Thought content normal.        Judgment: Judgment normal.     Results for orders placed or performed in visit on 07/21/20  Microscopic Examination   Urine  Result Value Ref Range   WBC, UA None seen 0 - 5 /hpf   RBC 0-2 0 - 2 /hpf   Epithelial Cells (non renal) 0-10 0 - 10 /hpf   Bacteria, UA Few (A) None seen/Few  CBC with Differential/Platelet  Result Value Ref Range   WBC 9.5 3.4 - 10.8 x10E3/uL   RBC 4.49 3.77 - 5.28 x10E6/uL   Hemoglobin 14.2 11.1 - 15.9 g/dL   Hematocrit 42.2 34.0 - 46.6 %   MCV 94 79 - 97 fL   MCH 31.6  26.6 - 33.0 pg   MCHC 33.6 31.5 - 35.7 g/dL   RDW 12.2 11.7 - 15.4 %   Platelets 282 150 - 450 x10E3/uL   Neutrophils 66 Not Estab. %   Lymphs 25 Not Estab. %   Monocytes 7 Not Estab. %   Eos 2 Not Estab. %   Basos 0 Not Estab. %   Neutrophils Absolute 6.3 1.4 - 7.0 x10E3/uL   Lymphocytes Absolute 2.4 0.7 - 3.1 x10E3/uL   Monocytes Absolute 0.6 0.1 - 0.9 x10E3/uL   EOS (ABSOLUTE) 0.2 0.0 - 0.4 x10E3/uL   Basophils Absolute 0.0 0.0 - 0.2 x10E3/uL   Immature Granulocytes 0 Not Estab. %   Immature Grans (Abs) 0.0 0.0 - 0.1 x10E3/uL  Comprehensive metabolic panel  Result Value Ref Range   Glucose 87 65 - 99 mg/dL   BUN 10 8 - 27 mg/dL   Creatinine, Ser 0.73 0.57 - 1.00 mg/dL   GFR calc non Af Amer 86 >59 mL/min/1.73   GFR calc Af Amer 99 >59 mL/min/1.73   BUN/Creatinine Ratio 14 12 - 28   Sodium 144 134 - 144 mmol/L   Potassium 4.0 3.5 - 5.2 mmol/L   Chloride 105 96 - 106 mmol/L   CO2 25 20 - 29 mmol/L   Calcium 9.8 8.7 - 10.3 mg/dL   Total Protein 7.0 6.0 - 8.5 g/dL   Albumin 4.5 3.8 - 4.8 g/dL   Globulin, Total 2.5 1.5 - 4.5 g/dL   Albumin/Globulin Ratio 1.8 1.2 - 2.2   Bilirubin Total 0.3 0.0 - 1.2 mg/dL   Alkaline Phosphatase 113 48 - 121 IU/L   AST 12 0 - 40 IU/L   ALT 11 0 - 32 IU/L  TSH  Result Value Ref Range   TSH 2.960 0.450 - 4.500 uIU/mL  UA/M w/rflx Culture, Routine   Specimen: Urine   Urine  Result Value Ref Range   Specific Gravity, UA 1.010 1.005 - 1.030   pH, UA 5.0 5.0 - 7.5   Color, UA Yellow Yellow   Appearance Ur Clear Clear   Leukocytes,UA Negative Negative   Protein,UA Negative Negative/Trace   Glucose, UA Negative Negative   Ketones, UA Negative Negative   RBC, UA 1+ (A) Negative   Bilirubin, UA Negative Negative   Urobilinogen, Ur 0.2 0.2 - 1.0 mg/dL   Nitrite, UA Negative Negative   Microscopic Examination See below:   Lipid Panel w/o Chol/HDL Ratio  Result Value Ref Range  Cholesterol, Total 149 100 - 199 mg/dL   Triglycerides 156  (H) 0 - 149 mg/dL   HDL 41 >39 mg/dL   VLDL Cholesterol Cal 27 5 - 40 mg/dL   LDL Chol Calc (NIH) 81 0 - 99 mg/dL      Assessment & Plan:   Problem List Items Addressed This Visit      Cardiovascular and Mediastinum   Hypertension - Primary    BPs stable and WNL, continue present medications, DASH diet.  Recommend patient check bp at home regularly first thing in the morning.  Discussed with patient that BP should be less than 140/90 or medications need to be adjusted.  If she finds that her blood pressures are elevated even when she is not mad she should return to clinic for medication adjustment.  Labs ordered today.       Relevant Medications   lisinopril (ZESTRIL) 20 MG tablet   Other Relevant Orders   Comp Met (CMET)     Digestive   GERD (gastroesophageal reflux disease)    Stable and well controlled, continue PPI therapy and diet modifications.        Other   Hyperlipidemia, unspecified    Recheck lipids, adjust as needed. Continue lipitor and lifestyle modifications.      Relevant Medications   lisinopril (ZESTRIL) 20 MG tablet   Other Relevant Orders   Lipid Profile   Anxiety    Chronic, stable and well controlled. Continue present medications.      Vitamin D deficiency    Not currently on supplementation, will check levels and restart if needed. Discussed good dietary sources and lots of sunlight.      Relevant Orders   Vitamin D (25 hydroxy)   Vitamin B12 deficiency    Not currently on supplementation, will recheck levels and restart if needed.      Relevant Orders   CBC   B12   Current every day smoker    Stable.  Not ready to quit.  Declines CT lung.            Follow up plan: Return in about 6 months (around 07/21/2021) for Physical and Fasting labs.

## 2021-01-21 NOTE — Assessment & Plan Note (Signed)
Stable and well controlled, continue PPI therapy and diet modifications.

## 2021-01-21 NOTE — Assessment & Plan Note (Signed)
Recheck lipids, adjust as needed. Continue lipitor and lifestyle modifications.

## 2021-01-21 NOTE — Assessment & Plan Note (Addendum)
BPs stable and WNL, continue present medications, DASH diet.  Recommend patient check bp at home regularly first thing in the morning.  Discussed with patient that BP should be less than 140/90 or medications need to be adjusted.  If she finds that her blood pressures are elevated even when she is not mad she should return to clinic for medication adjustment.  Labs ordered today.

## 2021-01-22 LAB — CBC
Hematocrit: 42.3 % (ref 34.0–46.6)
Hemoglobin: 13.9 g/dL (ref 11.1–15.9)
MCH: 31.1 pg (ref 26.6–33.0)
MCHC: 32.9 g/dL (ref 31.5–35.7)
MCV: 95 fL (ref 79–97)
Platelets: 287 10*3/uL (ref 150–450)
RBC: 4.47 x10E6/uL (ref 3.77–5.28)
RDW: 12 % (ref 11.7–15.4)
WBC: 9.3 10*3/uL (ref 3.4–10.8)

## 2021-01-22 LAB — COMPREHENSIVE METABOLIC PANEL
ALT: 11 IU/L (ref 0–32)
AST: 13 IU/L (ref 0–40)
Albumin/Globulin Ratio: 1.8 (ref 1.2–2.2)
Albumin: 4.5 g/dL (ref 3.8–4.8)
Alkaline Phosphatase: 130 IU/L — ABNORMAL HIGH (ref 44–121)
BUN/Creatinine Ratio: 11 — ABNORMAL LOW (ref 12–28)
BUN: 8 mg/dL (ref 8–27)
Bilirubin Total: 0.3 mg/dL (ref 0.0–1.2)
CO2: 21 mmol/L (ref 20–29)
Calcium: 9.5 mg/dL (ref 8.7–10.3)
Chloride: 105 mmol/L (ref 96–106)
Creatinine, Ser: 0.7 mg/dL (ref 0.57–1.00)
GFR calc Af Amer: 104 mL/min/{1.73_m2} (ref >59)
GFR calc non Af Amer: 91 mL/min/{1.73_m2} (ref >59)
Globulin, Total: 2.5 g/dL (ref 1.5–4.5)
Glucose: 78 mg/dL (ref 65–99)
Potassium: 4.1 mmol/L (ref 3.5–5.2)
Sodium: 142 mmol/L (ref 134–144)
Total Protein: 7 g/dL (ref 6.0–8.5)

## 2021-01-22 LAB — VITAMIN B12: Vitamin B-12: 225 pg/mL — ABNORMAL LOW (ref 232–1245)

## 2021-01-22 LAB — LIPID PANEL
Chol/HDL Ratio: 3.5 ratio (ref 0.0–4.4)
Cholesterol, Total: 143 mg/dL (ref 100–199)
HDL: 41 mg/dL (ref >39)
LDL Chol Calc (NIH): 82 mg/dL (ref 0–99)
Triglycerides: 108 mg/dL (ref 0–149)
VLDL Cholesterol Cal: 20 mg/dL (ref 5–40)

## 2021-01-22 LAB — VITAMIN D 25 HYDROXY (VIT D DEFICIENCY, FRACTURES): Vit D, 25-Hydroxy: 21.3 ng/mL — ABNORMAL LOW (ref 30.0–100.0)

## 2021-01-22 NOTE — Progress Notes (Signed)
Hi Pat, it was a pleasure meeting your yesterday.  Overall, your lab work looks good.  Your Vitamin D is still low so I would recommend that you begin Vitamin D 1000mg  once daily, this is over the counter.  Also, I recommend that you take Vitamin B12 1000mg  daily.  We will continue to monitor this at future appointments.  Otherwise, continue with your current regimen and follow up in 6 months.

## 2021-04-12 ENCOUNTER — Telehealth: Payer: PPO | Admitting: Family

## 2021-04-12 DIAGNOSIS — Z20822 Contact with and (suspected) exposure to covid-19: Secondary | ICD-10-CM | POA: Diagnosis not present

## 2021-04-12 MED ORDER — BENZONATATE 100 MG PO CAPS: 100.0000 mg | ORAL_CAPSULE | Freq: Three times a day (TID) | ORAL | 0 refills | Status: DC | PRN

## 2021-04-12 MED ORDER — DEXAMETHASONE 6 MG PO TABS: 6.0000 mg | ORAL_TABLET | Freq: Two times a day (BID) | ORAL | 0 refills | Status: DC

## 2021-04-12 MED ORDER — FLUTICASONE PROPIONATE 50 MCG/ACT NA SUSP: 2.0000 | Freq: Every day | NASAL | 6 refills | Status: DC

## 2021-04-12 NOTE — Progress Notes (Signed)
E-Visit for Corona Virus Screening  Your current symptoms could be consistent with the coronavirus.  Many health care providers can now test patients at their office but not all are.   has multiple testing sites. For information on our [place] testing locations and hours go to [email or web address]  We are enrolling you in our [place] for [place] . Daily you will receive a questionnaire within the [place] website. Our COVID 19 response team will be monitoring your responses daily.  Testing Information: The COVID-19 Community Testing sites are testing BY APPOINTMENT ONLY.  You can schedule online at [email or web address]  If you do not have access to a smart phone or computer you may call [phone] for an appointment.   Additional testing sites in the Community:  . For [facility] Testing sites in [facility]  [email or web address]  . For Pop-up testing sites in [place]  [email or web address].[email or web address]  . For [facility] [email or web address]  . For [facility] testing in [place] and [employer] [email or web address]  . For [facility] testing in [facility]   [email or web address]  For  more information about community testing call [phone]   Please quarantine yourself while awaiting your test results. Please stay home for a minimum of 10 days from the first day of illness with improving symptoms and you have had 24 hours of no fever (without the use of Tylenol (Acetaminophen) Motrin (Ibuprofen) or any fever reducing medication).  Also - Do not get tested prior to returning to work because once you have had a positive test the test can stay  positive for more than a month in some cases.   You should wear a mask or cloth face covering over your nose and mouth if you must be around other people or animals, including pets (even at home). Try to stay at least 6 feet away from other people. This will protect the people around you.  Please continue good preventive care measures, including:  frequent hand-washing, avoid touching your face, cover coughs/sneezes, stay out of crowds and keep a 6 foot distance from others.  COVID-19 is a respiratory illness with symptoms that are similar to the flu. Symptoms are typically mild to moderate, but there have been cases of severe illness and death due to the virus.   The following symptoms may appear 2-14 days after exposure: . Fever . Cough . Shortness of breath or difficulty breathing . Chills . Repeated shaking with chills . Muscle pain . Headache . Sore throat . New loss of taste or smell . Fatigue . Congestion or runny nose . Nausea or vomiting . Diarrhea  Go to the nearest hospital ED for assessment if fever/cough/breathlessness are severe or illness seems like a threat to life.  It is vitally important that if you feel that you have an infection such as this virus or any other virus that you stay home and away from places where you may spread it to others.  You should avoid contact with people age 10 and older.   You can use medication such as prescription cough medication called Tessalon Perles 100 mg. You may take 1-2 capsules every 8 hours as needed for cough and prescription for Fluticasone nasal spray 2 sprays in each nostril one time per day and dexamethasone 6 mg twice a day for 7 days.   You may also take acetaminophen (Tylenol) as needed for fever.  Reduce your risk of any infection by using the same precautions used for avoiding the common cold  or flu:  [name] Wash your hands often with soap and warm water for at least 20 seconds.  If soap and water are not readily available, use  an alcohol-based hand sanitizer with at least 60% alcohol.  . If coughing or sneezing, cover your mouth and nose by coughing or sneezing into the elbow areas of your shirt or coat, into a tissue or into your sleeve (not your hands). . Avoid shaking hands with others and consider head nods or verbal greetings only. . Avoid touching your eyes, nose, or mouth with unwashed hands.  . Avoid close contact with people who are sick. . Avoid places or events with large numbers of people in one location, like concerts or sporting events. . Carefully consider travel plans you have or are making. . If you are planning any travel outside or inside the [place], visit the CDC's Travelers' Health webpage for the latest health notices. . If you have some symptoms but not all symptoms, continue to monitor at home and seek medical attention if your symptoms worsen. . If you are having a medical emergency, call 911.  HOME CARE . Only take medications as instructed by your medical team. . Drink plenty of fluids and get plenty of rest. . A steam or ultrasonic humidifier can help if you have congestion.   GET HELP RIGHT AWAY IF YOU HAVE EMERGENCY WARNING SIGNS** FOR COVID-19. If you or someone is showing any of these signs seek emergency medical care immediately. Call 911 or proceed to your closest emergency facility if: . You develop worsening high fever. . Trouble breathing . Bluish lips or face . Persistent pain or pressure in the chest . New confusion . Inability to wake or stay awake . You cough up blood. . Your symptoms become more severe  **This list is not all possible symptoms. Contact your medical provider for any symptoms that are sever or concerning to you.  MAKE SURE YOU   Understand these instructions.  Will watch your condition.  Will get help right away if you are not doing well or get worse.  Your e-visit answers were reviewed by a board certified advanced clinical practitioner to complete  your personal care plan.  Depending on the condition, your plan could have included both over the counter or prescription medications.  If there is a problem please reply once you have received a response from your provider.  Your safety is important to [place].  If you have drug allergies check your prescription carefully.    You can use MyChart to ask questions about today's visit, request a non-urgent call back, or ask for a work or school excuse for 24 hours related to this e-Visit. If it has been greater than 24 hours you will need to follow up with your provider, or enter a new e-Visit to address those concerns. You will get an e-mail in the next two days asking about your experience.  I hope that your e-visit has been valuable and will speed your recovery. Thank you for using e-visits.   Approximately 5 minutes was spent documenting and reviewing patient's chart.

## 2021-04-23 ENCOUNTER — Other Ambulatory Visit: Payer: Self-pay

## 2021-04-23 ENCOUNTER — Other Ambulatory Visit: Payer: Self-pay | Admitting: Nurse Practitioner

## 2021-04-23 DIAGNOSIS — Z20822 Contact with and (suspected) exposure to covid-19: Secondary | ICD-10-CM

## 2021-04-23 NOTE — Telephone Encounter (Signed)
Routing to provider  

## 2021-05-08 ENCOUNTER — Emergency Department: Payer: PPO

## 2021-05-08 ENCOUNTER — Emergency Department
Admission: EM | Admit: 2021-05-08 | Discharge: 2021-05-08 | Disposition: A | Discharge status: Home / Self Care | Payer: PPO | Attending: Emergency Medicine | Admitting: Emergency Medicine

## 2021-05-08 ENCOUNTER — Other Ambulatory Visit: Payer: Self-pay

## 2021-05-08 ENCOUNTER — Encounter: Payer: Self-pay | Admitting: Emergency Medicine

## 2021-05-08 ENCOUNTER — Ambulatory Visit: Payer: Self-pay | Admitting: *Deleted

## 2021-05-08 DIAGNOSIS — R531 Weakness: Secondary | ICD-10-CM | POA: Diagnosis not present

## 2021-05-08 DIAGNOSIS — F17210 Nicotine dependence, cigarettes, uncomplicated: Secondary | ICD-10-CM | POA: Diagnosis not present

## 2021-05-08 DIAGNOSIS — Z7951 Long term (current) use of inhaled steroids: Secondary | ICD-10-CM | POA: Insufficient documentation

## 2021-05-08 DIAGNOSIS — Z79899 Other long term (current) drug therapy: Secondary | ICD-10-CM | POA: Insufficient documentation

## 2021-05-08 DIAGNOSIS — I10 Essential (primary) hypertension: Secondary | ICD-10-CM | POA: Insufficient documentation

## 2021-05-08 DIAGNOSIS — J449 Chronic obstructive pulmonary disease, unspecified: Secondary | ICD-10-CM | POA: Insufficient documentation

## 2021-05-08 DIAGNOSIS — R5383 Other fatigue: Secondary | ICD-10-CM | POA: Diagnosis not present

## 2021-05-08 HISTORY — DX: Chronic obstructive pulmonary disease, unspecified: J44.9

## 2021-05-08 LAB — COMPREHENSIVE METABOLIC PANEL
ALT: 17 U/L (ref 0–44)
AST: 17 U/L (ref 15–41)
Albumin: 3.9 g/dL (ref 3.5–5.0)
Alkaline Phosphatase: 90 U/L (ref 38–126)
Anion gap: 11 (ref 5–15)
BUN: 12 mg/dL (ref 8–23)
CO2: 28 mmol/L (ref 22–32)
Calcium: 9.9 mg/dL (ref 8.9–10.3)
Chloride: 101 mmol/L (ref 98–111)
Creatinine, Ser: 0.79 mg/dL (ref 0.44–1.00)
GFR, Estimated: >60 mL/min (ref >60)
Glucose, Bld: 96 mg/dL (ref 70–99)
Potassium: 4.6 mmol/L (ref 3.5–5.1)
Sodium: 140 mmol/L (ref 135–145)
Total Bilirubin: 0.6 mg/dL (ref 0.3–1.2)
Total Protein: 7.5 g/dL (ref 6.5–8.1)

## 2021-05-08 LAB — CBC
HCT: 41.8 % (ref 36.0–46.0)
Hemoglobin: 14 g/dL (ref 12.0–15.0)
MCH: 31.9 pg (ref 26.0–34.0)
MCHC: 33.5 g/dL (ref 30.0–36.0)
MCV: 95.2 fL (ref 80.0–100.0)
Platelets: 267 10*3/uL (ref 150–400)
RBC: 4.39 MIL/uL (ref 3.87–5.11)
RDW: 11.9 % (ref 11.5–15.5)
WBC: 9.3 10*3/uL (ref 4.0–10.5)
nRBC: 0 % (ref 0.0–0.2)

## 2021-05-08 LAB — URINALYSIS, COMPLETE (UACMP) WITH MICROSCOPIC
Bacteria, UA: NONE SEEN
Bilirubin Urine: NEGATIVE
Glucose, UA: NEGATIVE mg/dL
Ketones, ur: NEGATIVE mg/dL
Leukocytes,Ua: NEGATIVE
Nitrite: NEGATIVE
Protein, ur: NEGATIVE mg/dL
Specific Gravity, Urine: 1.008 (ref 1.005–1.030)
pH: 9 — ABNORMAL HIGH (ref 5.0–8.0)

## 2021-05-08 LAB — TROPONIN I (HIGH SENSITIVITY): Troponin I (High Sensitivity): 3 ng/L (ref <18)

## 2021-05-08 MED ORDER — SODIUM CHLORIDE 0.9 % IV BOLUS
500.0000 mL | Freq: Once | INTRAVENOUS | Status: AC
  Administered 2021-05-08: 500 mL via INTRAVENOUS

## 2021-05-08 MED ORDER — IPRATROPIUM-ALBUTEROL 0.5-2.5 (3) MG/3ML IN SOLN
3.0000 mL | Freq: Once | RESPIRATORY_TRACT | Status: AC
  Administered 2021-05-08: 3 mL via RESPIRATORY_TRACT
  Filled 2021-05-08: qty 3

## 2021-05-08 NOTE — ED Provider Notes (Signed)
[facility] Emergency Department Provider Note   ____________________________________________    I have reviewed the triage vital signs and the nursing notes.   HISTORY  Chief Complaint Weakness     HPI Valerie Donovan is a 65 y.o. female who presents with complaints of generalized weakness, fatigue for nearly the last month.  Patient reports she had an upper respiratory infection several weeks ago, was treated with steroids feels that she probably should have had antibiotics.  She notes symptoms have mostly improved but she feels diffusely fatigued and thinks that she may be dehydrated as well.  She denies chest pain, does smoke cigarettes.  No recent fevers or chills.  Had a negative COVID test a couple of weeks ago  Past Medical History:  Diagnosis Date  . Anxiety   . Arthritis    FINGERS  . COPD (chronic obstructive pulmonary disease) ([place])   . GERD (gastroesophageal reflux disease)   . Hyperlipidemia   . Hypertension   . Postmenopausal bleeding 2014; 10/202018; 05/14/18   endometrial bx done 09/2015 - nl; nl paps    Patient Active Problem List   Diagnosis Date Noted  . Current every day smoker 01/21/2021  . GERD (gastroesophageal reflux disease)   . Personal history of colonic polyps   . Polyp of descending colon   . Vitamin D deficiency 12/21/2017  . Vitamin B12 deficiency 12/21/2017  . Hypertension 08/05/2014  . Hyperlipidemia, unspecified 08/05/2014  . Anxiety 08/05/2014    Past Surgical History:  Procedure Laterality Date  . COLONOSCOPY WITH PROPOFOL N/A 07/20/2019   Procedure: COLONOSCOPY WITH PROPOFOL;  Surgeon: Lucilla Lame, MD;  Location: [place] [facility];  Service: Endoscopy;  Laterality: N/A;  . HYSTEROSCOPY WITH D & C N/A 06/15/2018   Procedure: DILATATION AND CURETTAGE /HYSTEROSCOPY;  Surgeon: Homero Fellers, MD;  Location: [facility];  Service: Gynecology;  Laterality: N/A;  . POLYPECTOMY  07/20/2019   Procedure:  POLYPECTOMY;  Surgeon: Lucilla Lame, MD;  Location: [place];  Service: Endoscopy;;  . TUBAL LIGATION    . WISDOM TOOTH EXTRACTION      Prior to Admission medications   Medication Sig Start Date End Date Taking? Authorizing Provider  atorvastatin (LIPITOR) 20 MG tablet TAKE 1 TABLET(20 MG) BY MOUTH EVERY MORNING 01/16/21   Jon Billings, NP  benzonatate (TESSALON PERLES) 100 MG capsule Take 1 capsule (100 mg total) by mouth 3 (three) times daily as needed. 04/12/21   Evelina Dun A, FNP  dexamethasone (DECADRON) 6 MG tablet Take 1 tablet (6 mg total) by mouth 2 (two) times daily. 04/12/21   Evelina Dun A, FNP  EPINEPHrine (EPIPEN 2-PAK) 0.3 mg/0.3 mL IJ SOAJ injection Inject 0.3 mLs (0.3 mg total) into the muscle as needed for anaphylaxis. 01/07/20   Volney American, PA-C  FLUoxetine (PROZAC) 40 MG capsule TAKE 1 CAPSULE(40 MG) BY MOUTH DAILY 01/16/21   Jon Billings, NP  fluticasone [facility]) 50 MCG/ACT nasal spray Place 2 sprays into both nostrils daily. 04/12/21   Evelina Dun A, FNP  lisinopril (ZESTRIL) 20 MG tablet TAKE 1 TABLET(20 MG) BY MOUTH DAILY 04/23/21   Jon Billings, NP  omeprazole (PRILOSEC) 20 MG capsule Take 1 capsule (20 mg total) by mouth daily as needed (for acid reflux/indigestion.). 07/21/20   Volney American, PA-C     Allergies Ciprofloxacin, Clarithromycin, Other, Penicillin g, and Codeine  Family History  Problem Relation Age of Onset  . Cancer Mother   . Diabetes Mother   .  Cancer Father        colon  . Diabetes Sister   . Diabetes Maternal Grandmother   . Diabetes Maternal Grandfather   . Diabetes Sister     Social History Social History   Tobacco Use  . Smoking status: Current Every Day Smoker    Packs/day: 0.50    Years: 44.00    Pack years: 22.00    Types: Cigarettes  . Smokeless tobacco: Never Used  Vaping Use  . Vaping Use: Former  Substance Use Topics  . Alcohol use: No  . Drug use: No    Comment: CBD OIL  IN [place]    Review of Systems  Constitutional: No fever/chills, fatigue Eyes: No visual changes.  ENT: No sore throat. Cardiovascular: Denies chest pain. Respiratory: Denies shortness of breath.  Positive wheezing Gastrointestinal: No abdominal pain.  No nausea, no vomiting.   Genitourinary: Negative for dysuria. Musculoskeletal: Negative for back pain.  Feels achy Skin: Negative for rash. Neurological: Negative for headaches    ____________________________________________   PHYSICAL EXAM:  VITAL SIGNS: ED Triage Vitals  Enc Vitals Group     BP 05/08/21 1259 (!) 145/68     Pulse Rate 05/08/21 1259 73     Resp 05/08/21 1259 18     Temp 05/08/21 1259 98.7 F (37.1 C)     Temp Source 05/08/21 1259 Oral     SpO2 05/08/21 1259 98 %     Weight 05/08/21 1258 62.6 kg (138 lb)     Height 05/08/21 1258 1.6 m (5\' 3" )     Head Circumference --      Peak Flow --      Pain Score 05/08/21 1258 0     Pain Loc --      Pain Edu? --      Excl. in [place]? --     Constitutional: Alert and oriented.   Nose: No congestion/rhinnorhea. Mouth/Throat: Mucous membranes are moist.   Neck:  Painless ROM Cardiovascular: Normal rate, regular rhythm. Grossly normal heart sounds.  Good peripheral circulation. Respiratory: Normal respiratory effort.  No retractions.  Scattered mild wheezes Gastrointestinal: Soft and nontender. No distention.  No CVA tenderness.  Musculoskeletal: No lower extremity tenderness nor edema.  Warm and well perfused Neurologic:  Normal speech and language. No gross focal neurologic deficits are appreciated.  Skin:  Skin is warm, dry and intact. No rash noted. Psychiatric: Mood and affect are normal. Speech and behavior are normal.  ____________________________________________   LABS (all labs ordered are listed, but only abnormal results are displayed)  Labs Reviewed  URINALYSIS, COMPLETE (UACMP) WITH MICROSCOPIC - Abnormal; Notable for the following components:       Result Value   Color, Urine YELLOW (*)    APPearance CLEAR (*)    pH 9.0 (*)    Hgb urine dipstick SMALL (*)    All other components within normal limits  CBC  COMPREHENSIVE METABOLIC PANEL  CBG MONITORING, ED  TROPONIN I (HIGH SENSITIVITY)   ____________________________________________  EKG  ED ECG REPORT I, Lavonia Drafts, the attending physician, personally viewed and interpreted this ECG.  Date: 05/08/2021  Rhythm: normal sinus rhythm QRS Axis: normal Intervals: normal ST/T Wave abnormalities: normal Narrative Interpretation: no evidence of acute ischemia  ____________________________________________  RADIOLOGY  Chest x-ray viewed by me, no acute abnormality ____________________________________________   PROCEDURES  Procedure(s) performed: No  Procedures   Critical Care performed: No ____________________________________________   INITIAL IMPRESSION / ASSESSMENT AND PLAN / ED COURSE  Pertinent labs & imaging results that were available during my care of the patient were reviewed by me and considered in my medical decision making (see chart for details).  Patient presents with generalized weakness and fatigue.  Exam overall reassuring except some mild wheezes which are likely chronic.  Will treat with DuoNeb's, obtain chest x-ray labs including electrolytes, give IV fluids and reevaluate  Patient's lab work is quite reassuring, CMP CBC normal.  Chest x-ray normal.  High sensitive troponin is normal.  Is feeling better after IV fluids and DuoNeb's.  No indication for admission at this time, close follow-up with PCP recommended.    ____________________________________________   FINAL CLINICAL IMPRESSION(S) / ED DIAGNOSES  Final diagnoses:  Generalized weakness        Note:  This document was prepared using Dragon voice recognition software and may include unintentional dictation errors.   Lavonia Drafts, MD 05/08/21 1436

## 2021-05-08 NOTE — Telephone Encounter (Signed)
FYI

## 2021-05-08 NOTE — ED Triage Notes (Signed)
First Nurse Note:  C/O Generalized weakness today.  AAOX3.  Skin warm and dry. NAD

## 2021-05-08 NOTE — Telephone Encounter (Signed)
Patient is calling to report she has not been feeling good for about 1 month- chest congestion- did e- visit and was treated with prednisone, cough medication. Patient took treatment- reports she is better with congestion- but has weakness.Patient states her weakness is severe and that she is hydrating. Advised ED for evaluation- she states she feels that is where she needs to be. Patient is going to call family member- if she can not reach her- advised 911. Patient understands.  Reason for Disposition . [1] MODERATE weakness (i.e., interferes with work, school, normal activities) AND [2] cause unknown  (Exceptions: weakness with acute minor illness, or weakness from poor fluid intake)  Answer Assessment - Initial Assessment Questions 1. DESCRIPTION: "Describe how you are feeling."     So weak she does not feel like moving- no energy 2. SEVERITY: "How bad is it?"  "Can you stand and walk?"   - MILD - Feels weak or tired, but does not interfere with work, school or normal activities   - [place] to stand and walk; weakness interferes with work, school, or normal activities   - SEVERE - Unable to stand or walk     moderate 3. ONSET:  "When did the weakness begin?"     2 weeks 4. CAUSE: "What do you think is causing the weakness?"     Unsure 5. MEDICINES: "Have you recently started a new medicine or had a change in the amount of a medicine?"     No changes- patient is taking multivit 6. OTHER SYMPTOMS: "Do you have any other symptoms?" (e.g., chest pain, fever, cough, SOB, vomiting, diarrhea, bleeding, other areas of pain)     Slight cough 7. PREGNANCY: "Is there any chance you are pregnant?" "When was your last menstrual period?"     n/a  Protocols used: WEAKNESS (GENERALIZED) AND FATIGUE-A-AH

## 2021-05-08 NOTE — ED Triage Notes (Signed)
Pt here with weakness and chest congestion for x1  Month. Pt also says that she is dehydration. Pt denies N, V, D, or pain.

## 2021-05-08 NOTE — ED Notes (Signed)
Pt calm , collective , denied pain or sob  

## 2021-05-08 NOTE — ED Notes (Signed)
Patient transported to X-ray 

## 2021-06-28 ENCOUNTER — Other Ambulatory Visit: Payer: Self-pay | Admitting: Nurse Practitioner

## 2021-06-28 NOTE — Telephone Encounter (Signed)
Requested Prescriptions  Pending Prescriptions Disp Refills  . atorvastatin (LIPITOR) 20 MG tablet [Pharmacy Med Name: ATORVASTATIN '20MG'$  TABLETS] 90 tablet 1    Sig: TAKE 1 TABLET(20 MG) BY MOUTH EVERY MORNING     Cardiovascular:  Antilipid - Statins Passed - 06/28/2021  7:42 AM      Passed - Total Cholesterol in normal range and within 360 days    Cholesterol, Total  Date Value Ref Range Status  01/21/2021 143 100 - 199 mg/dL Final         Passed - LDL in normal range and within 360 days    LDL Chol Calc (NIH)  Date Value Ref Range Status  01/21/2021 82 0 - 99 mg/dL Final         Passed - HDL in normal range and within 360 days    HDL  Date Value Ref Range Status  01/21/2021 41 >39 mg/dL Final         Passed - Triglycerides in normal range and within 360 days    Triglycerides  Date Value Ref Range Status  01/21/2021 108 0 - 149 mg/dL Final         Passed - Patient is not pregnant      Passed - Valid encounter within last 12 months    Recent Outpatient Visits          5 months ago Primary hypertension   [facility] Jon Billings, NP   11 months ago Essential hypertension   [place], Lilia Argue, [place]   1 year ago Essential hypertension   [facility], [place]   1 year ago Encounter for colorectal cancer screening   [facility], [place]   2 years ago Essential hypertension   [facility], [place]      Future Appointments            In 1 month Jon Billings, NP [employer], [place]   In 6 months  [employer], [place]

## 2021-07-29 ENCOUNTER — Other Ambulatory Visit: Payer: Self-pay

## 2021-07-29 ENCOUNTER — Ambulatory Visit (INDEPENDENT_AMBULATORY_CARE_PROVIDER_SITE_OTHER): Payer: PPO | Admitting: Nurse Practitioner

## 2021-07-29 ENCOUNTER — Encounter: Payer: Self-pay | Admitting: Nurse Practitioner

## 2021-07-29 ENCOUNTER — Telehealth: Payer: Self-pay

## 2021-07-29 VITALS — BP 116/64 | HR 80 | Temp 98.5°F | Ht 62.76 in | Wt 136.4 lb

## 2021-07-29 DIAGNOSIS — F419 Anxiety disorder, unspecified: Secondary | ICD-10-CM | POA: Diagnosis not present

## 2021-07-29 DIAGNOSIS — E559 Vitamin D deficiency, unspecified: Secondary | ICD-10-CM | POA: Diagnosis not present

## 2021-07-29 DIAGNOSIS — Z0000 Encounter for general adult medical examination without abnormal findings: Secondary | ICD-10-CM | POA: Diagnosis not present

## 2021-07-29 DIAGNOSIS — Z1231 Encounter for screening mammogram for malignant neoplasm of breast: Secondary | ICD-10-CM | POA: Diagnosis not present

## 2021-07-29 DIAGNOSIS — I10 Essential (primary) hypertension: Secondary | ICD-10-CM | POA: Diagnosis not present

## 2021-07-29 DIAGNOSIS — E538 Deficiency of other specified B group vitamins: Secondary | ICD-10-CM | POA: Diagnosis not present

## 2021-07-29 DIAGNOSIS — E7800 Pure hypercholesterolemia, unspecified: Secondary | ICD-10-CM | POA: Diagnosis not present

## 2021-07-29 LAB — MICROSCOPIC EXAMINATION: WBC, UA: NONE SEEN /hpf (ref 0–5)

## 2021-07-29 LAB — URINALYSIS, ROUTINE W REFLEX MICROSCOPIC
Bilirubin, UA: NEGATIVE
Glucose, UA: NEGATIVE
Leukocytes,UA: NEGATIVE
Nitrite, UA: NEGATIVE
Protein,UA: NEGATIVE
Specific Gravity, UA: 1.015 (ref 1.005–1.030)
Urobilinogen, Ur: 1 mg/dL (ref 0.2–1.0)
pH, UA: 6 (ref 5.0–7.5)

## 2021-07-29 MED ORDER — LISINOPRIL 20 MG PO TABS: ORAL_TABLET | ORAL | 1 refills | Status: DC

## 2021-07-29 MED ORDER — FLUOXETINE HCL 40 MG PO CAPS: ORAL_CAPSULE | ORAL | 1 refills | Status: DC

## 2021-07-29 NOTE — Assessment & Plan Note (Signed)
Chronic.  Controlled.  Continue with current medication regimen of Atorvastatin '20mg'$  daily.  Labs ordered today.  Will make further recommendations based on lab results. Return to clinic in 6 months for reevaluation.  Call sooner if concerns arise.

## 2021-07-29 NOTE — Telephone Encounter (Signed)
Refilled during appt today.

## 2021-07-29 NOTE — Assessment & Plan Note (Addendum)
Chronic.  Controlled.  Continue with current medication regimen of Lisinopril 20mg daily.  Refills sent today.  Labs ordered today.  Return to clinic in 6 months for reevaluation.  Call sooner if concerns arise.   

## 2021-07-29 NOTE — Assessment & Plan Note (Signed)
Chronic.  Controlled.  Continue with current medication regimen of Prozac '20mg'$  daily.  Labs ordered today.  Return to clinic in 6 months for reevaluation.  Call sooner if concerns arise.

## 2021-07-29 NOTE — Assessment & Plan Note (Signed)
Labs ordered today.  Will make recommendations based on lab results. ?

## 2021-07-29 NOTE — Telephone Encounter (Signed)
Patient needs a refill on lipitor.

## 2021-07-29 NOTE — Progress Notes (Signed)
BP 116/64   Pulse 80   Temp 98.5 F (36.9 C)   Ht 5' 2.76" (1.594 m)   Wt 136 lb 6 oz (61.9 kg)   LMP  (LMP Unknown)   SpO2 97%   BMI 24.35 kg/m    Subjective:    Patient ID: Valerie Donovan, female    DOB: 06-04-1956, 65 y.o.   MRN: [id number]  HPI: Valerie Donovan is a 65 y.o. female presenting on 07/29/2021 for comprehensive medical examination. Current medical complaints include:none  She currently lives with: Menopausal Symptoms: no  HYPERTENSION / HYPERLIPIDEMIA Satisfied with current treatment? no Duration of hypertension: years BP monitoring frequency:  BP range:  BP medication side effects: no Past BP meds: lisinopril Duration of hyperlipidemia: years Cholesterol medication side effects: no Cholesterol supplements: none Past cholesterol medications: atorvastain (lipitor) Medication compliance: excellent compliance Aspirin: no Recent stressors: no Recurrent headaches: no Visual changes: no Palpitations: no Dyspnea: no Chest pain: no Lower extremity edema: no Dizzy/lightheaded: no  ANXIETY Patient states she she is doing okay.  Continues on the Prozac '40mg'$ .  Denies SI.   Depression Screen done today and results listed below:  Depression screen [facility] 2/9 07/29/2021 12/19/2020 07/21/2020 01/07/2020 12/27/2018  Decreased Interest 0 0 1 0 1  Down, Depressed, Hopeless 0 0 1 0 1  PHQ - 2 Score 0 0 2 0 2  Altered sleeping - - 0 0 0  Tired, decreased energy - - 0 0 0  Change in appetite - - 0 0 0  Feeling bad or failure about yourself  - - 1 0 1  Trouble concentrating - - 0 0 0  Moving slowly or fidgety/restless - - 0 0 0  Suicidal thoughts - - 0 0 0  PHQ-9 Score - - 3 0 3  Difficult doing work/chores - - - - -    The patient does not have a history of falls. I did complete a risk assessment for falls. A plan of care for falls was documented.   Past Medical History:  Past Medical History:  Diagnosis Date   Allergy    Anxiety    Arthritis    FINGERS    Cataract    COPD (chronic obstructive pulmonary disease) ([place])    Depression    GERD (gastroesophageal reflux disease)    Hyperlipidemia    Hypertension    Postmenopausal bleeding 2014; 10/202018; 05/14/18   endometrial bx done 09/2015 - nl; nl paps    Surgical History:  Past Surgical History:  Procedure Laterality Date   COLONOSCOPY WITH PROPOFOL N/A 07/20/2019   Procedure: COLONOSCOPY WITH PROPOFOL;  Surgeon: Lucilla Lame, MD;  Location: [facility];  Service: Endoscopy;  Laterality: N/A;   HYSTEROSCOPY WITH D & C N/A 06/15/2018   Procedure: DILATATION AND CURETTAGE /HYSTEROSCOPY;  Surgeon: Homero Fellers, MD;  Location: [facility];  Service: Gynecology;  Laterality: N/A;   POLYPECTOMY  07/20/2019   Procedure: POLYPECTOMY;  Surgeon: Lucilla Lame, MD;  Location[facility];  Service: Endoscopy;;   TUBAL LIGATION     WISDOM TOOTH EXTRACTION      Medications:  Current Outpatient Medications on File Prior to Visit  Medication Sig   atorvastatin (LIPITOR) 20 MG tablet TAKE 1 TABLET(20 MG) BY MOUTH EVERY MORNING   EPINEPHrine (EPIPEN 2-PAK) 0.3 mg/0.3 mL IJ SOAJ injection Inject 0.3 mLs (0.3 mg total) into the muscle as needed for anaphylaxis.   fluticasone (FLONASE) 50 MCG/ACT nasal spray Place 2 sprays into  both nostrils daily.   omeprazole (PRILOSEC) 20 MG capsule Take 1 capsule (20 mg total) by mouth daily as needed (for acid reflux/indigestion.).   No current facility-administered medications on file prior to visit.    Allergies:  Allergies  Allergen Reactions   Ciprofloxacin Anaphylaxis   Clarithromycin Anaphylaxis   Other Anaphylaxis    ANTIBIOTICS-PT CAN TOLERATE Z-PAC AND DOXYCYCLINE BUT ALL OTHERS SHE CANNOT   Penicillin G Anaphylaxis    Has patient had a PCN reaction causing immediate rash, facial/tongue/throat swelling, SOB or lightheadedness with hypotension: Yes Has patient had a PCN reaction causing severe rash involving mucus membranes or  skin necrosis: No Has patient had a PCN reaction that required hospitalization: No Has patient had a PCN reaction occurring within the last 10 years: No If all of the above answers are "NO", then may proceed with Cephalosporin use.    Codeine Itching    vomiting    Social History:  Social History   Socioeconomic History   Marital status: Divorced    Spouse name: Not on file   Number of children: Not on file   Years of education: Not on file   Highest education level: Not on file  Occupational History   Occupation: retired  Tobacco Use   Smoking status: Every Day    Packs/day: 0.50    Years: 44.00    Pack years: 22.00    Types: Cigarettes   Smokeless tobacco: Never  Vaping Use   Vaping Use: Former  Substance and Sexual Activity   Alcohol use: No   Drug use: No    Comment: CBD OIL IN [place]   Sexual activity: Not Currently    Birth control/protection: Post-menopausal  Other Topics Concern   Not on file  Social History Narrative   Not on file   Social Determinants of Health   Financial Resource Strain: Low Risk    Difficulty of Paying Living Expenses: Not hard at all  Food Insecurity: No Food Insecurity   Worried About Charity fundraiser in the Last Year: Never true   [place] in the Last Year: Never true  Transportation Needs: No Transportation Needs   Lack of Transportation (Medical): No   Lack of Transportation (Non-Medical): No  Physical Activity: Sufficiently Active   Days of Exercise per Week: 7 days   Minutes of Exercise per Session: 60 min  Stress: No Stress Concern Present   Feeling of Stress : Not at all  Social Connections: Not on file  Intimate Partner Violence: Not on file   Social History   Tobacco Use  Smoking Status Every Day   Packs/day: 0.50   Years: 44.00   Pack years: 22.00   Types: Cigarettes  Smokeless Tobacco Never   Social History   Substance and Sexual Activity  Alcohol Use No    Family History:  Family History   Problem Relation Age of Onset   Cancer Mother    Diabetes Mother    Early death Mother    Cancer Father        colon   Arthritis Father    Diabetes Sister    Diabetes Maternal Grandmother    Diabetes Maternal Grandfather    Diabetes Sister     Past medical history, surgical history, medications, allergies, family history and social history reviewed with patient today and changes made to appropriate areas of the chart.   Review of Systems  Eyes:  Negative for blurred vision and double vision.  Respiratory:  Negative for shortness of breath.   Cardiovascular:  Negative for chest pain, palpitations and leg swelling.  Neurological:  Negative for dizziness and headaches.  Psychiatric/Behavioral:  Negative for suicidal ideas. The patient is nervous/anxious.   All other ROS negative except what is listed above and in the HPI.      Objective:    BP 116/64   Pulse 80   Temp 98.5 F (36.9 C)   Ht 5' 2.76" (1.594 m)   Wt 136 lb 6 oz (61.9 kg)   LMP  (LMP Unknown)   SpO2 97%   BMI 24.35 kg/m   Wt Readings from Last 3 Encounters:  07/29/21 136 lb 6 oz (61.9 kg)  05/08/21 138 lb (62.6 kg)  01/21/21 138 lb 3.2 oz (62.7 kg)    Physical Exam Vitals and nursing note reviewed.  Constitutional:      General: She is awake. She is not in acute distress.    Appearance: She is well-developed. She is not ill-appearing.  HENT:     Head: Normocephalic and atraumatic.     Right Ear: Hearing, tympanic membrane, ear canal and external ear normal. No drainage.     Left Ear: Hearing, tympanic membrane, ear canal and external ear normal. No drainage.     Nose: Nose normal.     Right Sinus: No maxillary sinus tenderness or frontal sinus tenderness.     Left Sinus: No maxillary sinus tenderness or frontal sinus tenderness.     Mouth/Throat:     Mouth: Mucous membranes are moist.     Pharynx: Oropharynx is clear. Uvula midline. No pharyngeal swelling, oropharyngeal exudate or posterior  oropharyngeal erythema.  Eyes:     General: Lids are normal.        Right eye: No discharge.        Left eye: No discharge.     Extraocular Movements: Extraocular movements intact.     Conjunctiva/sclera: Conjunctivae normal.     Pupils: Pupils are equal, round, and reactive to light.     Visual Fields: Right eye visual fields normal and left eye visual fields normal.  Neck:     Thyroid: No thyromegaly.     Vascular: No carotid bruit.     Trachea: Trachea normal.  Cardiovascular:     Rate and Rhythm: Normal rate and regular rhythm.     Heart sounds: Normal heart sounds. No murmur heard.   No gallop.  Pulmonary:     Effort: Pulmonary effort is normal. No accessory muscle usage or respiratory distress.     Breath sounds: Normal breath sounds.  Chest:  Breasts:    Right: Normal.     Left: Normal.  Abdominal:     General: Bowel sounds are normal.     Palpations: Abdomen is soft. There is no hepatomegaly or splenomegaly.     Tenderness: There is no abdominal tenderness.  Musculoskeletal:        General: Normal range of motion.     Cervical back: Normal range of motion and neck supple.     Right lower leg: No edema.     Left lower leg: No edema.  Lymphadenopathy:     Head:     Right side of head: No submental, submandibular, tonsillar, preauricular or posterior auricular adenopathy.     Left side of head: No submental, submandibular, tonsillar, preauricular or posterior auricular adenopathy.     Cervical: No cervical adenopathy.     Upper Body:  Right upper body: No supraclavicular, axillary or pectoral adenopathy.     Left upper body: No supraclavicular, axillary or pectoral adenopathy.  Skin:    General: Skin is warm and dry.     Capillary Refill: Capillary refill takes less than 2 seconds.     Findings: No rash.  Neurological:     Mental Status: She is alert and oriented to person, place, and time.     Cranial Nerves: Cranial nerves are intact.     Gait: Gait is  intact.     Deep Tendon Reflexes: Reflexes are normal and symmetric.     Reflex Scores:      Brachioradialis reflexes are 2+ on the right side and 2+ on the left side.      Patellar reflexes are 2+ on the right side and 2+ on the left side. Psychiatric:        Attention and Perception: Attention normal.        Mood and Affect: Mood normal.        Speech: Speech normal.        Behavior: Behavior normal. Behavior is cooperative.        Thought Content: Thought content normal.        Judgment: Judgment normal.    Results for orders placed or performed during the hospital encounter of 05/08/21  CBC  Result Value Ref Range   WBC 9.3 4.0 - 10.5 K/uL   RBC 4.39 3.87 - 5.11 MIL/uL   Hemoglobin 14.0 12.0 - 15.0 g/dL   HCT 41.8 36.0 - 46.0 %   MCV 95.2 80.0 - 100.0 fL   MCH 31.9 26.0 - 34.0 pg   MCHC 33.5 30.0 - 36.0 g/dL   RDW 11.9 11.5 - 15.5 %   Platelets 267 150 - 400 K/uL   nRBC 0.0 0.0 - 0.2 %  Urinalysis, Complete w Microscopic Urine, Clean Catch  Result Value Ref Range   Color, Urine YELLOW (A) YELLOW   APPearance CLEAR (A) CLEAR   Specific Gravity, Urine 1.008 1.005 - 1.030   pH 9.0 (H) 5.0 - 8.0   Glucose, UA NEGATIVE NEGATIVE mg/dL   Hgb urine dipstick SMALL (A) NEGATIVE   Bilirubin Urine NEGATIVE NEGATIVE   Ketones, ur NEGATIVE NEGATIVE mg/dL   Protein, ur NEGATIVE NEGATIVE mg/dL   Nitrite NEGATIVE NEGATIVE   Leukocytes,Ua NEGATIVE NEGATIVE   RBC / HPF 0-5 0 - 5 RBC/hpf   WBC, UA 0-5 0 - 5 WBC/hpf   Bacteria, UA NONE SEEN NONE SEEN   Squamous Epithelial / LPF 0-5 0 - 5  Comprehensive metabolic panel  Result Value Ref Range   Sodium 140 135 - 145 mmol/L   Potassium 4.6 3.5 - 5.1 mmol/L   Chloride 101 98 - 111 mmol/L   CO2 28 22 - 32 mmol/L   Glucose, Bld 96 70 - 99 mg/dL   BUN 12 8 - 23 mg/dL   Creatinine, Ser 0.79 0.44 - 1.00 mg/dL   Calcium 9.9 8.9 - 10.3 mg/dL   Total Protein 7.5 6.5 - 8.1 g/dL   Albumin 3.9 3.5 - 5.0 g/dL   AST 17 15 - 41 U/L   ALT 17 0 -  44 U/L   Alkaline Phosphatase 90 38 - 126 U/L   Total Bilirubin 0.6 0.3 - 1.2 mg/dL   GFR, Estimated >60 >60 mL/min   Anion gap 11 5 - 15  Troponin I (High Sensitivity)  Result Value Ref Range   Troponin I (High  Sensitivity) 3 <18 ng/L      Assessment & Plan:   Problem List Items Addressed This Visit       Cardiovascular and Mediastinum   Hypertension    Chronic.  Controlled.  Continue with current medication regimen of Lisinopril '20mg'$  daily. Refills sent today. Labs ordered today.  Return to clinic in 6 months for reevaluation.  Call sooner if concerns arise.        Relevant Medications   lisinopril (ZESTRIL) 20 MG tablet     Other   Hyperlipidemia, unspecified    Chronic.  Controlled.  Continue with current medication regimen of Atorvastatin '20mg'$  daily.  Labs ordered today.  Will make further recommendations based on lab results. Return to clinic in 6 months for reevaluation.  Call sooner if concerns arise.        Relevant Medications   lisinopril (ZESTRIL) 20 MG tablet   Anxiety    Chronic.  Controlled.  Continue with current medication regimen of Prozac '20mg'$  daily.  Labs ordered today.  Return to clinic in 6 months for reevaluation.  Call sooner if concerns arise.        Relevant Medications   FLUoxetine (PROZAC) 40 MG capsule   Vitamin D deficiency    Labs ordered today. Will make recommendations based on lab results.       Relevant Orders   Vitamin D (25 hydroxy)   Vitamin B12 deficiency    Labs ordered today. Will make recommendations based on lab results.       Relevant Orders   B12   Other Visit Diagnoses     Annual physical exam    -  Primary   Health maintenance discussed today.  Refused vaccines due to allergies. Labs ordered today. Mammogram ordered today.    Relevant Orders   CBC with Differential/Platelet   Comprehensive metabolic panel   Lipid panel   TSH   Urinalysis, Routine w reflex microscopic   MM Digital Screening   Encounter for  screening mammogram for malignant neoplasm of breast       Relevant Orders   MM Digital Screening        Follow up plan: Return in about 6 months (around 01/29/2022) for HTN, HLD, DM2 FU.   LABORATORY TESTING:  - Pap smear: not applicable  IMMUNIZATIONS:   - Tdap: Tetanus vaccination status reviewed: last tetanus booster within 10 years. - Influenza: Postponed to flu season - Pneumovax: Refused - Prevnar: Refused - HPV: Not applicable - Zostavax vaccine: Refused  SCREENING: -Mammogram: Ordered today  - Colonoscopy: Up to date  - Bone Density: Up to date  -Hearing Test: Not applicable  -Spirometry: Not applicable   PATIENT COUNSELING:   Advised to take 1 mg of folate supplement per day if capable of pregnancy.   Sexuality: Discussed sexually transmitted diseases, partner selection, use of condoms, avoidance of unintended pregnancy  and contraceptive alternatives.   Advised to avoid cigarette smoking.  I discussed with the patient that most people either abstain from alcohol or drink within safe limits (<=14/week and <=4 drinks/occasion for males, <=7/weeks and <= 3 drinks/occasion for females) and that the risk for alcohol disorders and other health effects rises proportionally with the number of drinks per week and how often a drinker exceeds daily limits.  Discussed cessation/primary prevention of drug use and availability of treatment for abuse.   Diet: Encouraged to adjust caloric intake to maintain  or achieve ideal body weight, to reduce intake of dietary saturated fat  and total fat, to limit sodium intake by avoiding high sodium foods and not adding table salt, and to maintain adequate dietary potassium and calcium preferably from fresh fruits, vegetables, and low-fat dairy products.    stressed the importance of regular exercise  Injury prevention: Discussed safety belts, safety helmets, smoke detector, smoking near bedding or upholstery.   Dental health: Discussed  importance of regular tooth brushing, flossing, and dental visits.    NEXT PREVENTATIVE PHYSICAL DUE IN 1 YEAR. Return in about 6 months (around 01/29/2022) for HTN, HLD, DM2 FU.

## 2021-07-30 LAB — CBC WITH DIFFERENTIAL/PLATELET
Basophils Absolute: 0 10*3/uL (ref 0.0–0.2)
Basos: 0 %
EOS (ABSOLUTE): 0.2 10*3/uL (ref 0.0–0.4)
Eos: 2 %
Hematocrit: 40.4 % (ref 34.0–46.6)
Hemoglobin: 13.6 g/dL (ref 11.1–15.9)
Immature Grans (Abs): 0.1 10*3/uL (ref 0.0–0.1)
Immature Granulocytes: 1 %
Lymphocytes Absolute: 3.3 10*3/uL — ABNORMAL HIGH (ref 0.7–3.1)
Lymphs: 36 %
MCH: 32.2 pg (ref 26.6–33.0)
MCHC: 33.7 g/dL (ref 31.5–35.7)
MCV: 96 fL (ref 79–97)
Monocytes Absolute: 0.7 10*3/uL (ref 0.1–0.9)
Monocytes: 8 %
Neutrophils Absolute: 4.9 10*3/uL (ref 1.4–7.0)
Neutrophils: 53 %
Platelets: 310 10*3/uL (ref 150–450)
RBC: 4.23 x10E6/uL (ref 3.77–5.28)
RDW: 11.8 % (ref 11.7–15.4)
WBC: 9.2 10*3/uL (ref 3.4–10.8)

## 2021-07-30 LAB — COMPREHENSIVE METABOLIC PANEL
ALT: 14 IU/L (ref 0–32)
AST: 11 IU/L (ref 0–40)
Albumin/Globulin Ratio: 2.4 — ABNORMAL HIGH (ref 1.2–2.2)
Albumin: 4.6 g/dL (ref 3.8–4.8)
Alkaline Phosphatase: 102 IU/L (ref 44–121)
BUN/Creatinine Ratio: 9 — ABNORMAL LOW (ref 12–28)
BUN: 9 mg/dL (ref 8–27)
Bilirubin Total: 0.2 mg/dL (ref 0.0–1.2)
CO2: 23 mmol/L (ref 20–29)
Calcium: 9.7 mg/dL (ref 8.7–10.3)
Chloride: 105 mmol/L (ref 96–106)
Creatinine, Ser: 1.01 mg/dL — ABNORMAL HIGH (ref 0.57–1.00)
Globulin, Total: 1.9 g/dL (ref 1.5–4.5)
Glucose: 79 mg/dL (ref 65–99)
Potassium: 4.3 mmol/L (ref 3.5–5.2)
Sodium: 141 mmol/L (ref 134–144)
Total Protein: 6.5 g/dL (ref 6.0–8.5)
eGFR: 61 mL/min/{1.73_m2} (ref >59)

## 2021-07-30 LAB — LIPID PANEL
Chol/HDL Ratio: 3.9 ratio (ref 0.0–4.4)
Cholesterol, Total: 167 mg/dL (ref 100–199)
HDL: 43 mg/dL (ref >39)
LDL Chol Calc (NIH): 86 mg/dL (ref 0–99)
Triglycerides: 226 mg/dL — ABNORMAL HIGH (ref 0–149)
VLDL Cholesterol Cal: 38 mg/dL (ref 5–40)

## 2021-07-30 LAB — VITAMIN B12: Vitamin B-12: 1438 pg/mL — ABNORMAL HIGH (ref 232–1245)

## 2021-07-30 LAB — TSH: TSH: 2.52 u[IU]/mL (ref 0.450–4.500)

## 2021-07-30 LAB — VITAMIN D 25 HYDROXY (VIT D DEFICIENCY, FRACTURES): Vit D, 25-Hydroxy: 33.4 ng/mL (ref 30.0–100.0)

## 2021-07-30 NOTE — Progress Notes (Signed)
Hi Valerie Donovan. It was great to see you yesterday.  Your lab work shows that you were a little dehydrated yesterday when we drew your labs.  Make sure you are drinking plenty of water, ideally about 64 ounces daily.   Your triglycerides are elevated but you may not have been fasting yesterday. Your B12 is good and your Vitamin D is within normal range.    Please let me know if you have any questions.  Continue with your current medication regimen.  See you at our next visit.

## 2021-08-06 IMAGING — CR DG CHEST 2V
1 series · 2 of 2 positions shown · non-contrast
Comparison: June 05, 2006

CLINICAL DATA: Chest congestion and weakness.  Hypertension.

EXAM:
CHEST - 2 VIEW

[Series 1: dg chest 2 view · 0.14mm/px · 2 of 2 slices shown]
[im 1/2]
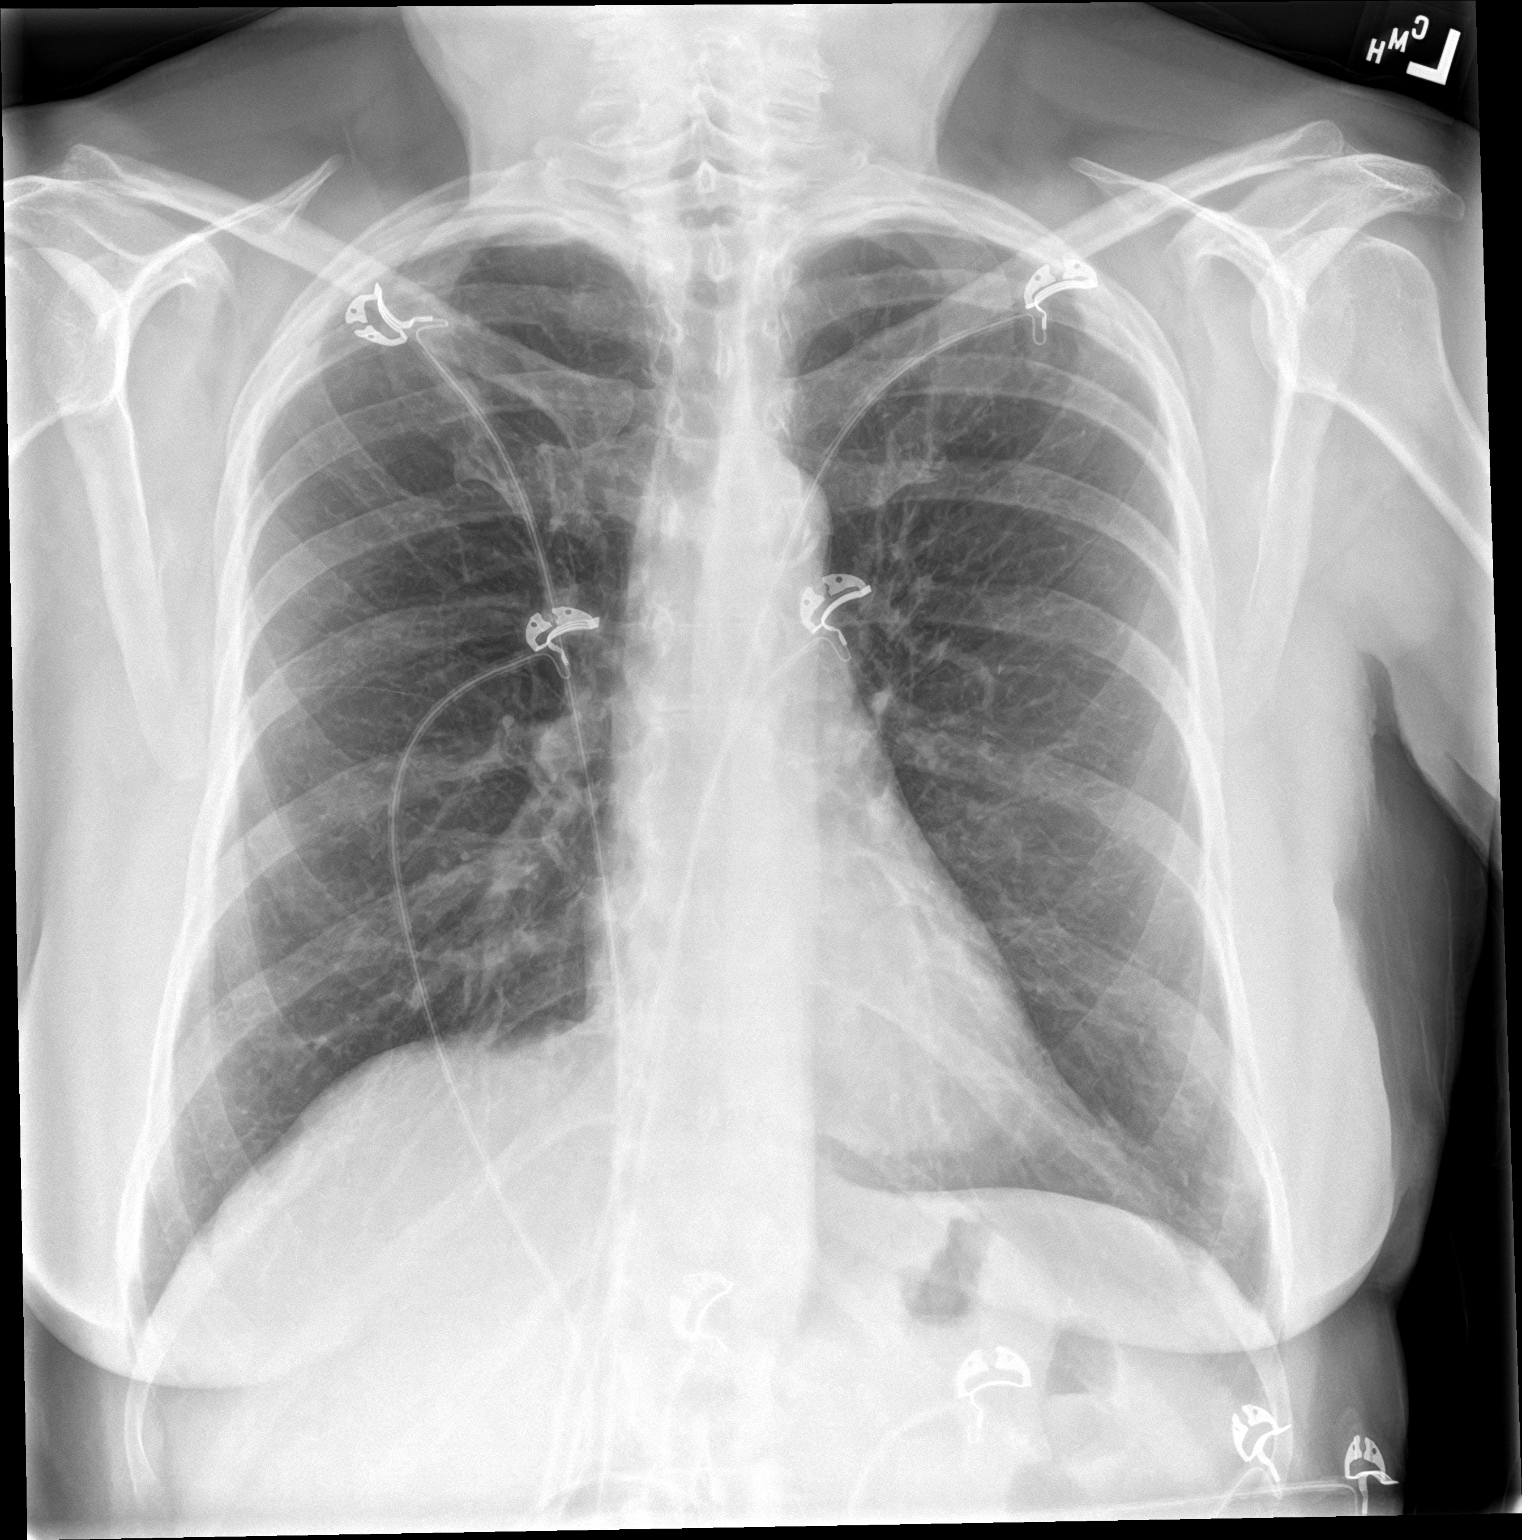
[im 2/2]
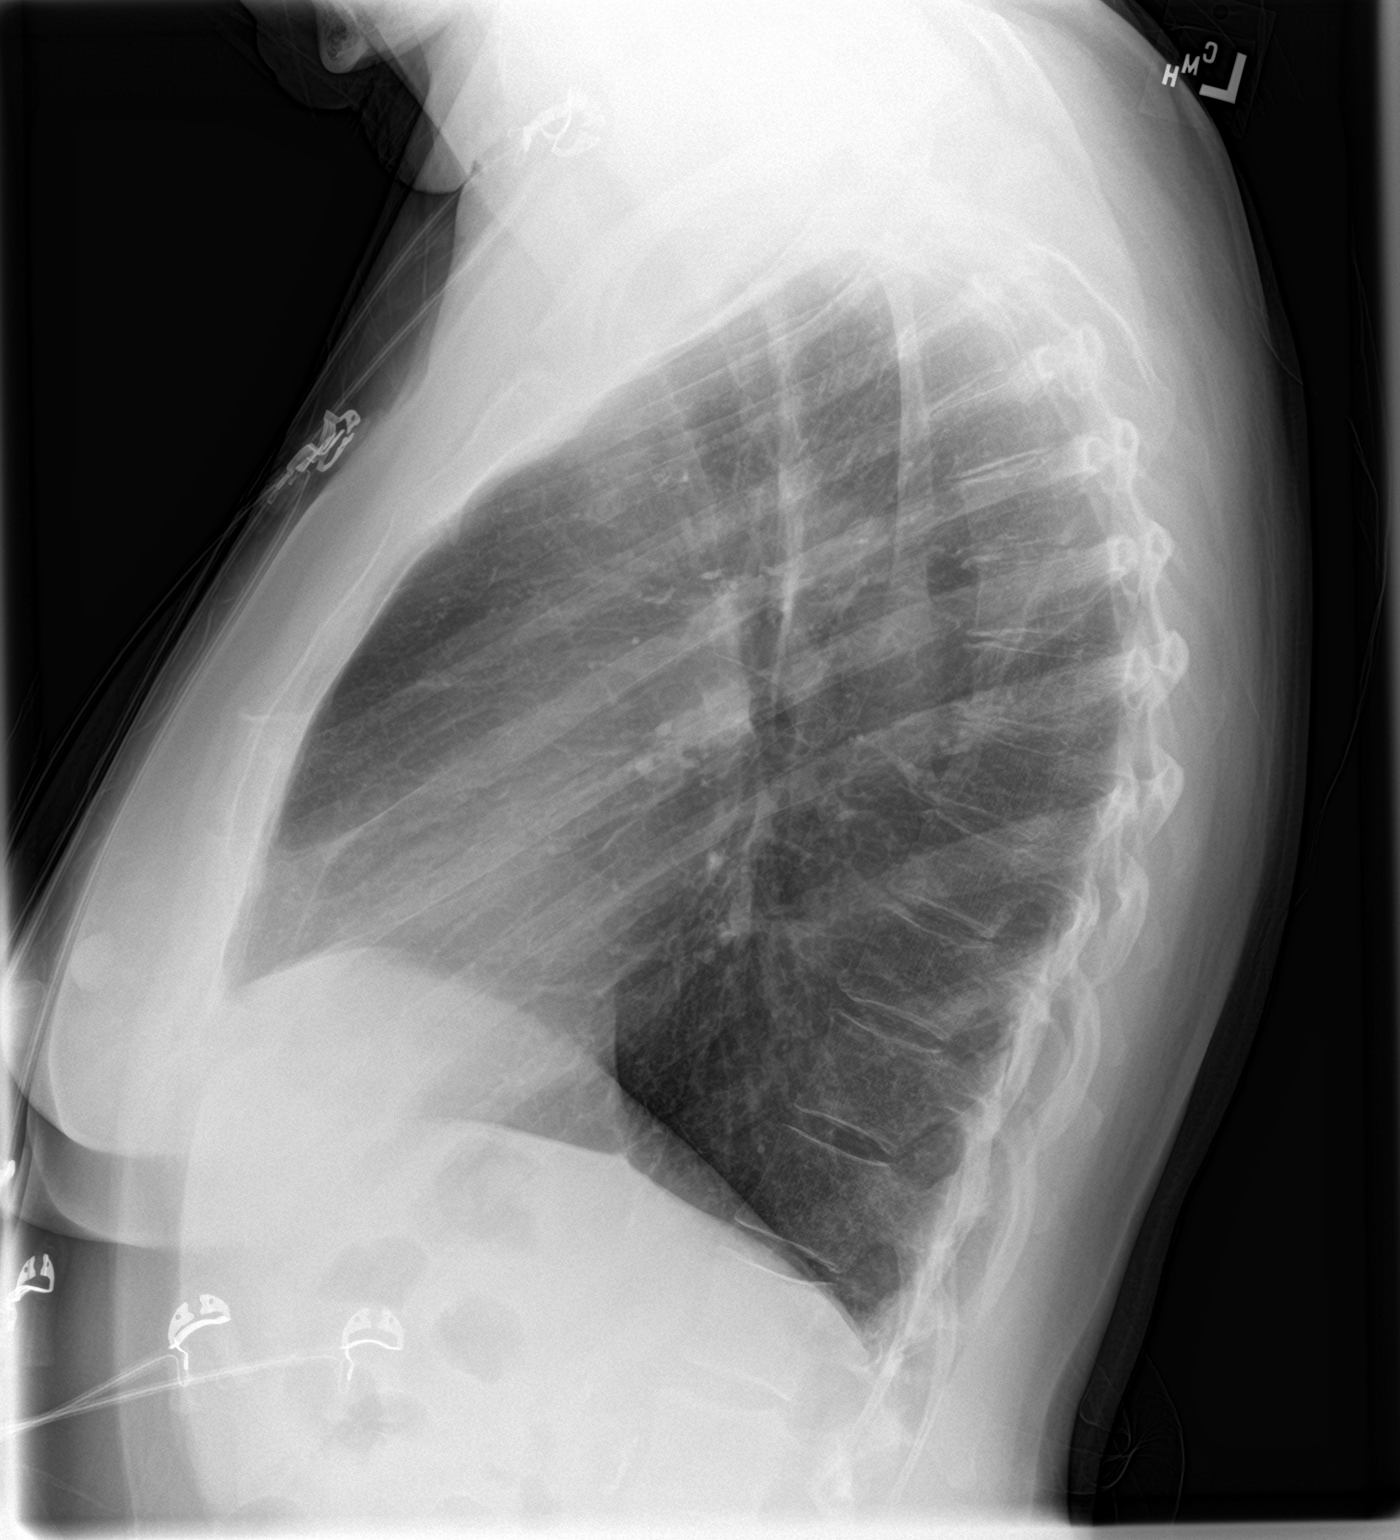

[2 of 2 positions shown; findings below may reference images not displayed]

FINDINGS: Lungs are clear. Heart size and pulmonary vascularity normal. No
adenopathy. There is mild degenerative change in the thoracic spine.
There is aortic atherosclerosis.
IMPRESSION: Lungs clear. Cardiac silhouette normal. Aortic Atherosclerosis
([id number]).

## 2021-08-17 ENCOUNTER — Other Ambulatory Visit: Payer: Self-pay | Admitting: Nurse Practitioner

## 2021-10-11 ENCOUNTER — Other Ambulatory Visit: Payer: Self-pay | Admitting: Nurse Practitioner

## 2021-10-11 NOTE — Telephone Encounter (Signed)
last RF 07/29/21 #90  1 RF

## 2021-12-25 ENCOUNTER — Ambulatory Visit: Payer: PPO

## 2021-12-30 ENCOUNTER — Ambulatory Visit (INDEPENDENT_AMBULATORY_CARE_PROVIDER_SITE_OTHER): Payer: PPO | Admitting: *Deleted

## 2021-12-30 DIAGNOSIS — Z0000 Encounter for general adult medical examination without abnormal findings: Secondary | ICD-10-CM | POA: Diagnosis not present

## 2021-12-30 NOTE — Patient Instructions (Signed)
Valerie Donovan , Thank you for taking time to come for your Medicare Wellness Visit. I appreciate your ongoing commitment to your health goals. Please review the following plan we discussed and let me know if I can assist you in the future.   Screening recommendations/referrals: Colonoscopy: up to date Mammogram: Education provided Bone Density: up to date Recommended yearly ophthalmology/optometry visit for glaucoma screening and checkup Recommended yearly dental visit for hygiene and checkup  Vaccinations: Influenza vaccine: declined Pneumococcal vaccine: declined Tdap vaccine: up to date Shingles vaccine: declined    Advanced directives: Education provided  Conditions/risks identified:   Next appointment: 01-29-2021 @ 1:20 [facility] 51 Years and Older, Female Preventive care refers to lifestyle choices and visits with your health care provider that can promote health and wellness. What does preventive care include? A yearly physical exam. This is also called an annual well check. Dental exams once or twice a year. Routine eye exams. Ask your health care provider how often you should have your eyes checked. Personal lifestyle choices, including: Daily care of your teeth and gums. Regular physical activity. Eating a healthy diet. Avoiding tobacco and drug use. Limiting alcohol use. Practicing safe sex. Taking low-dose aspirin every day. Taking vitamin and mineral supplements as recommended by your health care provider. What happens during an annual well check? The services and screenings done by your health care provider during your annual well check will depend on your age, overall health, lifestyle risk factors, and family history of disease. Counseling  Your health care provider may ask you questions about your: Alcohol use. Tobacco use. Drug use. Emotional well-being. Home and relationship well-being. Sexual activity. Eating habits. History of  falls. Memory and ability to understand (cognition). Work and work Statistician. Reproductive health. Screening  You may have the following tests or measurements: Height, weight, and BMI. Blood pressure. Lipid and cholesterol levels. These may be checked every 5 years, or more frequently if you are over 26 years old. Skin check. Lung cancer screening. You may have this screening every year starting at age 47 if you have a 30-pack-year history of smoking and currently smoke or have quit within the past 15 years. Fecal occult blood test (FOBT) of the stool. You may have this test every year starting at age 20. Flexible sigmoidoscopy or colonoscopy. You may have a sigmoidoscopy every 5 years or a colonoscopy every 10 years starting at age 56. Hepatitis C blood test. Hepatitis B blood test. Sexually transmitted disease (STD) testing. Diabetes screening. This is done by checking your blood sugar (glucose) after you have not eaten for a while (fasting). You may have this done every 1-3 years. Bone density scan. This is done to screen for osteoporosis. You may have this done starting at age 53. Mammogram. This may be done every 1-2 years. Talk to your health care provider about how often you should have regular mammograms. Talk with your health care provider about your test results, treatment options, and if necessary, the need for more tests. Vaccines  Your health care provider may recommend certain vaccines, such as: Influenza vaccine. This is recommended every year. Tetanus, diphtheria, and acellular pertussis (Tdap, Td) vaccine. You may need a Td booster every 10 years. Zoster vaccine. You may need this after age 63. Pneumococcal 13-valent conjugate (PCV13) vaccine. One dose is recommended after age 41. Pneumococcal polysaccharide (PPSV23) vaccine. One dose is recommended after age 68. Talk to your health care provider about which screenings and vaccines you  need and how often you need  them. This information is not intended to replace advice given to you by your health care provider. Make sure you discuss any questions you have with your health care provider. Document Released: 12/19/2015 Document Revised: 08/11/2016 Document Reviewed: 09/23/2015 Elsevier Interactive Patient Education  2017 [place] Prevention in the Home Falls can cause injuries. They can happen to people of all ages. There are many things you can do to make your home safe and to help prevent falls. What can I do on the outside of my home? Regularly fix the edges of walkways and driveways and fix any cracks. Remove anything that might make you trip as you walk through a door, such as a raised step or threshold. Trim any bushes or trees on the path to your home. Use bright outdoor lighting. Clear any walking paths of anything that might make someone trip, such as rocks or tools. Regularly check to see if handrails are loose or broken. Make sure that both sides of any steps have handrails. Any raised decks and porches should have guardrails on the edges. Have any leaves, snow, or ice cleared regularly. Use sand or salt on walking paths during winter. Clean up any spills in your garage right away. This includes oil or grease spills. What can I do in the bathroom? Use night lights. Install grab bars by the toilet and in the tub and shower. Do not use towel bars as grab bars. Use non-skid mats or decals in the tub or shower. If you need to sit down in the shower, use a plastic, non-slip stool. Keep the floor dry. Clean up any water that spills on the floor as soon as it happens. Remove soap buildup in the tub or shower regularly. Attach bath mats securely with double-sided non-slip rug tape. Do not have throw rugs and other things on the floor that can make you trip. What can I do in the bedroom? Use night lights. Make sure that you have a light by your bed that is easy to reach. Do not use  any sheets or blankets that are too big for your bed. They should not hang down onto the floor. Have a firm chair that has side arms. You can use this for support while you get dressed. Do not have throw rugs and other things on the floor that can make you trip. What can I do in the kitchen? Clean up any spills right away. Avoid walking on wet floors. Keep items that you use a lot in easy-to-reach places. If you need to reach something above you, use a strong step stool that has a grab bar. Keep electrical cords out of the way. Do not use floor polish or wax that makes floors slippery. If you must use wax, use non-skid floor wax. Do not have throw rugs and other things on the floor that can make you trip. What can I do with my stairs? Do not leave any items on the stairs. Make sure that there are handrails on both sides of the stairs and use them. Fix handrails that are broken or loose. Make sure that handrails are as long as the stairways. Check any carpeting to make sure that it is firmly attached to the stairs. Fix any carpet that is loose or worn. Avoid having throw rugs at the top or bottom of the stairs. If you do have throw rugs, attach them to the floor with carpet tape. Make sure that  you have a light switch at the top of the stairs and the bottom of the stairs. If you do not have them, ask someone to add them for you. What else can I do to help prevent falls? Wear shoes that: Do not have high heels. Have rubber bottoms. Are comfortable and fit you well. Are closed at the toe. Do not wear sandals. If you use a stepladder: Make sure that it is fully opened. Do not climb a closed stepladder. Make sure that both sides of the stepladder are locked into place. Ask someone to hold it for you, if possible. Clearly mark and make sure that you can see: Any grab bars or handrails. First and last steps. Where the edge of each step is. Use tools that help you move around (mobility aids)  if they are needed. These include: Canes. Walkers. Scooters. Crutches. Turn on the lights when you go into a dark area. Replace any light bulbs as soon as they burn out. Set up your furniture so you have a clear path. Avoid moving your furniture around. If any of your floors are uneven, fix them. If there are any pets around you, be aware of where they are. Review your medicines with your doctor. Some medicines can make you feel dizzy. This can increase your chance of falling. Ask your doctor what other things that you can do to help prevent falls. This information is not intended to replace advice given to you by your health care provider. Make sure you discuss any questions you have with your health care provider. Document Released: 09/18/2009 Document Revised: 04/29/2016 Document Reviewed: 12/27/2014 Elsevier Interactive Patient Education  2017 [employer].

## 2021-12-30 NOTE — Progress Notes (Signed)
Subjective:   Valerie Donovan is a 66 y.o. female who presents for Medicare Annual (Subsequent) preventive examination.  I connected with  Valerie Donovan on 12/30/21 by a telephone enabled telemedicine application and verified that I am speaking with the correct person using two identifiers.   I discussed the limitations of evaluation and management by telemedicine. The patient expressed understanding and agreed to proceed.  Patient location: home  Provider location: Tele-Health not in office    Review of Systems     Cardiac Risk Factors include: advanced age (>17men, >67 women);hypertension     Objective:    Today's Vitals   There is no height or weight on file to calculate BMI.  Advanced Directives 12/30/2021 05/08/2021 12/19/2020 07/20/2019 06/15/2018 06/07/2018  Does Patient Have a Medical Advance Directive? No No No No No No  Would patient like information on creating a medical advance directive? No - Patient declined - - No - Patient declined No - Patient declined -    Current Medications (verified) Outpatient Encounter Medications as of 12/30/2021  Medication Sig   atorvastatin (LIPITOR) 20 MG tablet TAKE 1 TABLET(20 MG) BY MOUTH EVERY MORNING   EPINEPHrine (EPIPEN 2-PAK) 0.3 mg/0.3 mL IJ SOAJ injection Inject 0.3 mLs (0.3 mg total) into the muscle as needed for anaphylaxis.   FLUoxetine (PROZAC) 40 MG capsule Take 1 tab daily.   fluticasone (FLONASE) 50 MCG/ACT nasal spray Place 2 sprays into both nostrils daily.   lisinopril (ZESTRIL) 20 MG tablet TAKE 1 TABLET(20 MG) BY MOUTH DAILY   omeprazole (PRILOSEC) 20 MG capsule Take 1 capsule (20 mg total) by mouth daily as needed (for acid reflux/indigestion.).   No facility-administered encounter medications on file as of 12/30/2021.    Allergies (verified) Ciprofloxacin, Clarithromycin, Other, Penicillin g, and Codeine   History: Past Medical History:  Diagnosis Date   Allergy    Anxiety    Arthritis    FINGERS    Cataract    COPD (chronic obstructive pulmonary disease) ([place])    Depression    GERD (gastroesophageal reflux disease)    Hyperlipidemia    Hypertension    Postmenopausal bleeding 2014; 10/202018; 05/14/18   endometrial bx done 09/2015 - nl; nl paps   Past Surgical History:  Procedure Laterality Date   COLONOSCOPY WITH PROPOFOL N/A 07/20/2019   Procedure: COLONOSCOPY WITH PROPOFOL;  Surgeon: Lucilla Lame, MD;  Location: [place];  Service: Endoscopy;  Laterality: N/A;   HYSTEROSCOPY WITH D & C N/A 06/15/2018   Procedure: DILATATION AND CURETTAGE /HYSTEROSCOPY;  Surgeon: Homero Fellers, MD;  Location: [facility];  Service: Gynecology;  Laterality: N/A;   POLYPECTOMY  07/20/2019   Procedure: POLYPECTOMY;  Surgeon: Lucilla Lame, MD;  Location: [place];  Service: Endoscopy;;   TUBAL LIGATION     WISDOM TOOTH EXTRACTION     Family History  Problem Relation Age of Onset   Cancer Mother    Diabetes Mother    Early death Mother    Cancer Father        colon   Arthritis Father    Diabetes Sister    Diabetes Maternal Grandmother    Diabetes Maternal Grandfather    Diabetes Sister    Social History   Socioeconomic History   Marital status: Divorced    Spouse name: Not on file   Number of children: Not on file   Years of education: Not on file   Highest education level: Not on file  Occupational  History   Occupation: retired  Tobacco Use   Smoking status: Every Day    Packs/day: 0.50    Years: 44.00    Pack years: 22.00    Types: Cigarettes   Smokeless tobacco: Never  Vaping Use   Vaping Use: Former  Substance and Sexual Activity   Alcohol use: No   Drug use: No    Comment: CBD OIL IN JUUL   Sexual activity: Not Currently    Birth control/protection: Post-menopausal  Other Topics Concern   Not on file  Social History Narrative   Not on file   Social Determinants of Health   Financial Resource Strain: Low Risk    Difficulty of Paying  Living Expenses: Not very hard  Food Insecurity: No Food Insecurity   Worried About Charity fundraiser in the Last Year: Never true   Ran Out of Food in the Last Year: Never true  Transportation Needs: No Transportation Needs   Lack of Transportation (Medical): No   Lack of Transportation (Non-Medical): No  Physical Activity: Sufficiently Active   Days of Exercise per Week: 4 days   Minutes of Exercise per Session: 50 min  Stress: No Stress Concern Present   Feeling of Stress : Only a little  Social Connections: Socially Isolated   Frequency of Communication with Friends and Family: More than three times a week   Frequency of Social Gatherings with Friends and Family: More than three times a week   Attends Religious Services: Never   Marine scientist or Organizations: No   Attends Music therapist: Never   Marital Status: Divorced    Tobacco Counseling Ready to quit: Not Answered Counseling given: Not Answered   Clinical Intake:  Pre-visit preparation completed: No  Pain : No/denies pain     Nutritional Risks: None Diabetes: No  How often do you need to have someone help you when you read instructions, pamphlets, or other written materials from your doctor or pharmacy?: 1 - Never  Diabetic?  no     Information entered by :: Leroy Kennedy LPN   Activities of Daily Living In your present state of health, do you have any difficulty performing the following activities: 12/30/2021 07/29/2021  Hearing? N N  Vision? N N  Difficulty concentrating or making decisions? N N  Walking or climbing stairs? N N  Dressing or bathing? N N  Doing errands, shopping? N N  Preparing Food and eating ? N -  Using the Toilet? N -  In the past six months, have you accidently leaked urine? N -  Do you have problems with loss of bowel control? N -  Managing your Medications? N -  Managing your Finances? N -  Housekeeping or managing your Housekeeping? N -  Some recent  data might be hidden    Patient Care Team: Jon Billings, NP as PCP - General  Indicate any recent Medical Services you may have received from other than [facility] providers in the past year (date may be approximate).     Assessment:   This is a routine wellness examination for [place].  Hearing/Vision screen Hearing Screening - Comments:: No trouble hearing Vision Screening - Comments:: Not up to date [place] issues and exercise activities discussed: Current Exercise Habits: Home exercise routine, Type of exercise: walking, Time (Minutes): 45, Frequency (Times/Week): 4, Weekly Exercise (Minutes/Week): 180, Intensity: Mild, Exercise limited by: None identified   Goals Addressed  This Visit's Progress    Patient Stated       Continue current lifestyle       Depression Screen PHQ 2/9 Scores 12/30/2021 07/29/2021 12/19/2020 07/21/2020 01/07/2020 12/27/2018 06/21/2018  PHQ - 2 Score 0 0 0 2 0 2 0  PHQ- 9 Score - - - 3 0 3 0    Fall Risk Fall Risk  12/30/2021 07/29/2021 12/19/2020 07/21/2020 12/31/2018  Falls in the past year? 0 0 0 0 0  Number falls in past yr: 0 0 - 0 -  Injury with Fall? 0 0 - 0 -  Risk for fall due to : - No Fall Risks Medication side effect - -  Follow up Falls evaluation completed;Falls prevention discussed Falls evaluation completed Falls evaluation completed;Education provided;Falls prevention discussed - Falls evaluation completed;Falls prevention discussed    FALL RISK PREVENTION PERTAINING TO THE HOME:  Any stairs in or around the home? No  If so, are there any without handrails? No  Home free of loose throw rugs in walkways, pet beds, electrical cords, etc? Yes  Adequate lighting in your home to reduce risk of falls? Yes   ASSISTIVE DEVICES UTILIZED TO PREVENT FALLS:  Life alert? No  Use of a cane, walker or w/c? No  Grab bars in the bathroom? Yes  Shower chair or bench in shower? No  Elevated toilet seat or a handicapped  toilet? Yes   TIMED UP AND GO:  Was the test performed? No .    Cognitive Function:  Normal cognitive status assessed by direct observation by this Nurse Health Advisor. No abnormalities found.       6CIT Screen 12/19/2020  What Year? 0 points  What month? 0 points  What time? 0 points  Count back from 20 0 points  Months in reverse 0 points  Repeat phrase 0 points  Total Score 0    Immunizations Immunization History  Administered Date(s) Administered   PFIZER(Purple Top)SARS-COV-2 Vaccination 07/21/2020, 08/18/2020   Tdap 09/04/2015    TDAP status: Up to date  Flu Vaccine status: Declined, Education has been provided regarding the importance of this vaccine but patient still declined. Advised may receive this vaccine at local pharmacy or Health Dept. Aware to provide a copy of the vaccination record if obtained from local pharmacy or Health Dept. Verbalized acceptance and understanding.  Pneumococcal vaccine status: Declined,  Education has been provided regarding the importance of this vaccine but patient still declined. Advised may receive this vaccine at local pharmacy or Health Dept. Aware to provide a copy of the vaccination record if obtained from local pharmacy or Health Dept. Verbalized acceptance and understanding.   Covid-19 vaccine status: Declined, Education has been provided regarding the importance of this vaccine but patient still declined. Advised may receive this vaccine at local pharmacy or Health Dept.or vaccine clinic. Aware to provide a copy of the vaccination record if obtained from local pharmacy or Health Dept. Verbalized acceptance and understanding.  Qualifies for Shingles Vaccine? Yes   Zostavax completed No   Shingrix Completed?: No.    Education has been provided regarding the importance of this vaccine. Patient has been advised to call insurance company to determine out of pocket expense if they have not yet received this vaccine. Advised may also  receive vaccine at local pharmacy or Health Dept. Verbalized acceptance and understanding.  Screening Tests Health Maintenance  Topic Date Due   MAMMOGRAM  01/26/2020   COVID-19 Vaccine (3 - Booster for [employer] series) 01/15/2022 (  Originally 10/13/2020)   INFLUENZA VACCINE  03/05/2022 (Originally 07/06/2021)   Zoster Vaccines- Shingrix (1 of 2) 03/30/2022 (Originally 05/04/2004)   Pneumonia Vaccine 35+ Years old (1 - PCV) 12/30/2022 (Originally 05/04/1962)   COLONOSCOPY (Pts 45-53yrs Insurance coverage will need to be confirmed)  07/19/2024   TETANUS/TDAP  09/03/2025   DEXA SCAN  Completed   Hepatitis C Screening  Completed   HPV VACCINES  Aged Out    Health Maintenance  Health Maintenance Due  Topic Date Due   MAMMOGRAM  01/26/2020    Colorectal cancer screening: Type of screening: Colonoscopy. Completed 2020. Repeat every 5 years  Mammogram status: Ordered  . Pt provided with contact info and advised to call to schedule appt.   Bone Density status: Completed  . Results reflect: Bone density results: NORMAL. Repeat every   years.  Lung Cancer Screening: (Low Dose CT Chest recommended if Age 17-80 years, 30 pack-year currently smoking OR have quit w/in 15years.) does not qualify.   Lung Cancer Screening Referral:   Additional Screening:  Hepatitis C Screening: does not qualify; Completed 2018  Vision Screening: Recommended annual ophthalmology exams for early detection of glaucoma and other disorders of the eye. Is the patient up to date with their annual eye exam?  No  Who is the provider or what is the name of the office in which the patient attends annual eye exams? [facility] If pt is not established with a provider, would they like to be referred to a provider to establish care? No .   Dental Screening: Recommended annual dental exams for proper oral hygiene  Community Resource Referral / Chronic Care Management: CRR required this visit?  No   CCM required this  visit?  No      Plan:     I have personally reviewed and noted the following in the patients chart:   Medical and social history Use of alcohol, tobacco or illicit drugs  Current medications and supplements including opioid prescriptions.  Functional ability and status Nutritional status Physical activity Advanced directives List of other physicians Hospitalizations, surgeries, and ER visits in previous 12 months Vitals Screenings to include cognitive, depression, and falls Referrals and appointments  In addition, I have reviewed and discussed with patient certain preventive protocols, quality metrics, and best practice recommendations. A written personalized care plan for preventive services as well as general preventive health recommendations were provided to patient.     Leroy Kennedy, LPN   08/03/9370   Nurse Notes:

## 2022-01-07 ENCOUNTER — Other Ambulatory Visit: Payer: Self-pay | Admitting: Nurse Practitioner

## 2022-01-07 NOTE — Telephone Encounter (Signed)
Requested Prescriptions  Pending Prescriptions Disp Refills   FLUoxetine (PROZAC) 40 MG capsule [Pharmacy Med Name: FLUOXETINE 40MG  CAPSULES] 90 capsule 1    Sig: TAKE 1 CAPSULE BY MOUTH DAILY     Psychiatry:  Antidepressants - SSRI Passed - 01/07/2022  7:15 AM      Passed - Valid encounter within last 6 months    Recent Outpatient Visits          5 months ago Annual physical exam   [facility] Jon Billings, NP   11 months ago Primary hypertension   [facility] Jon Billings, NP   1 year ago Essential hypertension   [facility] Volney American, [place]   2 years ago Essential hypertension   [facility], [place]   2 years ago Encounter for colorectal cancer screening   [facility], Lilia Argue, [place]      Future Appointments            In 3 weeks Jon Billings, NP [place]            atorvastatin (LIPITOR) 20 MG tablet [Pharmacy Med Name: ATORVASTATIN 20MG  TABLETS] 90 tablet 1    Sig: TAKE 1 TABLET(20 MG) BY MOUTH EVERY MORNING     Cardiovascular:  Antilipid - Statins Failed - 01/07/2022  7:15 AM      Failed - Lipid Panel in normal range within the last 12 months    Cholesterol, Total  Date Value Ref Range Status  07/29/2021 167 100 - 199 mg/dL Final   LDL Chol Calc (NIH)  Date Value Ref Range Status  07/29/2021 86 0 - 99 mg/dL Final   HDL  Date Value Ref Range Status  07/29/2021 43 >39 mg/dL Final   Triglycerides  Date Value Ref Range Status  07/29/2021 226 (H) 0 - 149 mg/dL Final         Passed - Patient is not pregnant      Passed - Valid encounter within last 12 months    Recent Outpatient Visits          5 months ago Annual physical exam   [facility] Jon Billings, NP   11 months ago Primary hypertension   [facility] Jon Billings, NP   1 year ago Essential hypertension   [facility]ney American, [place]   2 years ago Essential hypertension   [facility]ney American, [place]   2 years ago Encounter for colorectal cancer screening   [place], Lilia Argue, [place]      Future Appointments            In 3 weeks Jon Billings, NP [place], [place]

## 2022-01-29 ENCOUNTER — Ambulatory Visit: Payer: PPO | Admitting: Nurse Practitioner

## 2022-02-09 DIAGNOSIS — H43813 Vitreous degeneration, bilateral: Secondary | ICD-10-CM | POA: Diagnosis not present

## 2022-02-11 ENCOUNTER — Ambulatory Visit (INDEPENDENT_AMBULATORY_CARE_PROVIDER_SITE_OTHER): Payer: PPO | Admitting: Nurse Practitioner

## 2022-02-11 ENCOUNTER — Other Ambulatory Visit: Payer: Self-pay

## 2022-02-11 ENCOUNTER — Encounter: Payer: Self-pay | Admitting: Nurse Practitioner

## 2022-02-11 VITALS — BP 139/59 | HR 64 | Temp 98.8°F | Wt 135.0 lb

## 2022-02-11 DIAGNOSIS — E538 Deficiency of other specified B group vitamins: Secondary | ICD-10-CM | POA: Diagnosis not present

## 2022-02-11 DIAGNOSIS — F419 Anxiety disorder, unspecified: Secondary | ICD-10-CM

## 2022-02-11 DIAGNOSIS — I10 Essential (primary) hypertension: Secondary | ICD-10-CM | POA: Diagnosis not present

## 2022-02-11 DIAGNOSIS — E559 Vitamin D deficiency, unspecified: Secondary | ICD-10-CM

## 2022-02-11 DIAGNOSIS — E7800 Pure hypercholesterolemia, unspecified: Secondary | ICD-10-CM

## 2022-02-11 DIAGNOSIS — B029 Zoster without complications: Secondary | ICD-10-CM

## 2022-02-11 MED ORDER — VALACYCLOVIR HCL 1 G PO TABS: 1000.0000 mg | ORAL_TABLET | Freq: Two times a day (BID) | ORAL | 0 refills | Status: AC

## 2022-02-11 MED ORDER — GABAPENTIN 100 MG PO CAPS: 100.0000 mg | ORAL_CAPSULE | Freq: Three times a day (TID) | ORAL | 0 refills | Status: DC

## 2022-02-11 MED ORDER — TRAZODONE HCL 50 MG PO TABS: 50.0000 mg | ORAL_TABLET | Freq: Every day | ORAL | 1 refills | Status: DC

## 2022-02-11 NOTE — Assessment & Plan Note (Signed)
Chronic.  Controlled.  Continue with current medication regimen.  Labs ordered today.  Return to clinic in 6 months for reevaluation.  Call sooner if concerns arise.  ? ?

## 2022-02-11 NOTE — Assessment & Plan Note (Signed)
Labs ordered today.  Will make recommendations based on lab results. ?

## 2022-02-11 NOTE — Assessment & Plan Note (Signed)
Chronic.  Controlled.  Continue with current medication regimen on Lisinopril '10mg'$ .  Refills sent today.  Labs ordered today.  Return to clinic in 6 months for reevaluation.  Call sooner if concerns arise.  ? ?

## 2022-02-11 NOTE — Progress Notes (Signed)
BP (!) 139/59    Pulse 64    Temp 98.8 F (37.1 C) (Oral)    Wt 135 lb (61.2 kg)    LMP  (LMP Unknown)    SpO2 97%    BMI 24.10 kg/m    Subjective:    Patient ID: Valerie Donovan, female    DOB: 07-Jul-1956, 66 y.o.   MRN: [id number]  HPI: [name] is a 66 y.o. female  Chief Complaint  Patient presents with   Depression   Hyperlipidemia   Hypertension   Herpes Zoster    Pt states she thinks she has a patch of shingles on her back that started last Thursday   Insomnia    Pt states she has been having trouble sleeping, states she takes 10 mg of melatonin daily and can only sleep for about 2 hours   HYPERTENSION / HYPERLIPIDEMIA Satisfied with current treatment? yes Duration of hypertension: years BP monitoring frequency: rarely BP range: 130/80  BP medication side effects: no Past BP meds: lisinopril Duration of hyperlipidemia: years Cholesterol medication side effects: yes Cholesterol supplements: none Past cholesterol medications: atorvastain (lipitor) Medication compliance: excellent compliance Aspirin: no Recent stressors: no Recurrent headaches: no Visual changes: no Palpitations: no Dyspnea: no Chest pain: no Lower extremity edema: no Dizzy/lightheaded: no  ANXIETY Well controlled on Fluoxitine.  Denies concerns at visit today.  She would like to continue the medication for now.  May wasn't to change to something different in the future.   Current everyday smoker.  Not ready to quit.  SLEEP Patient states she is taking melatonin '10mg'$  and helps her sleep for about 2 hours.  She has not tried prescription medication.  Has trouble shutting her mind off. [name]  SHINGLES Patient states since Thursday of last week she had a shingles outbreak.  She is having a lot of pain and wants to scratch.  Feels like it is worse on the inside than it is on the outside.    Relevant past medical, surgical, family and social history reviewed and updated as indicated.  Interim medical history since our last visit reviewed. Allergies and medications reviewed and updated.  Review of Systems  Eyes:  Negative for visual disturbance.  Respiratory:  Negative for cough, chest tightness and shortness of breath.   Cardiovascular:  Negative for chest pain, palpitations and leg swelling.  Skin:  Positive for rash.  Neurological:  Negative for dizziness and headaches.  Psychiatric/Behavioral:  Positive for sleep disturbance. The patient is nervous/anxious.    Per HPI unless specifically indicated above     Objective:    BP (!) 139/59    Pulse 64    Temp 98.8 F (37.1 C) (Oral)    Wt 135 lb (61.2 kg)    LMP  (LMP Unknown)    SpO2 97%    BMI 24.10 kg/m   Wt Readings from Last 3 Encounters:  02/11/22 135 lb (61.2 kg)  07/29/21 136 lb 6 oz (61.9 kg)  05/08/21 138 lb (62.6 kg)    Physical Exam Vitals and nursing note reviewed.  Constitutional:      General: She is not in acute distress.    Appearance: Normal appearance. She is normal weight. She is not ill-appearing, toxic-appearing or diaphoretic.  HENT:     Head: Normocephalic.     Right Ear: External ear normal.     Left Ear: External ear normal.     Nose: Nose normal.     Mouth/Throat:  Mouth: Mucous membranes are moist.     Pharynx: Oropharynx is clear.  Eyes:     General:        Right eye: No discharge.        Left eye: No discharge.     Extraocular Movements: Extraocular movements intact.     Conjunctiva/sclera: Conjunctivae normal.     Pupils: Pupils are equal, round, and reactive to light.  Cardiovascular:     Rate and Rhythm: Normal rate and regular rhythm.     Heart sounds: No murmur heard. Pulmonary:     Effort: Pulmonary effort is normal. No respiratory distress.     Breath sounds: Normal breath sounds. No wheezing or rales.  Musculoskeletal:     Cervical back: Normal range of motion and neck supple.  Skin:    General: Skin is warm and dry.     Capillary Refill: Capillary refill  takes less than 2 seconds.       Neurological:     General: No focal deficit present.     Mental Status: She is alert and oriented to person, place, and time. Mental status is at baseline.  Psychiatric:        Mood and Affect: Mood normal.        Behavior: Behavior normal.        Thought Content: Thought content normal.        Judgment: Judgment normal.    Results for orders placed or performed in visit on 07/29/21  Microscopic Examination   Urine  Result Value Ref Range   WBC, UA None seen 0 - 5 /hpf   RBC 0-2 0 - 2 /hpf   Epithelial Cells (non renal) 0-10 0 - 10 /hpf   Bacteria, UA Few (A) None seen/Few  CBC with Differential/Platelet  Result Value Ref Range   WBC 9.2 3.4 - 10.8 x10E3/uL   RBC 4.23 3.77 - 5.28 x10E6/uL   Hemoglobin 13.6 11.1 - 15.9 g/dL   Hematocrit 40.4 34.0 - 46.6 %   MCV 96 79 - 97 fL   MCH 32.2 26.6 - 33.0 pg   MCHC 33.7 31.5 - 35.7 g/dL   RDW 11.8 11.7 - 15.4 %   Platelets 310 150 - 450 x10E3/uL   Neutrophils 53 Not Estab. %   Lymphs 36 Not Estab. %   Monocytes 8 Not Estab. %   Eos 2 Not Estab. %   Basos 0 Not Estab. %   Neutrophils Absolute 4.9 1.4 - 7.0 x10E3/uL   Lymphocytes Absolute 3.3 (H) 0.7 - 3.1 x10E3/uL   Monocytes Absolute 0.7 0.1 - 0.9 x10E3/uL   EOS (ABSOLUTE) 0.2 0.0 - 0.4 x10E3/uL   Basophils Absolute 0.0 0.0 - 0.2 x10E3/uL   Immature Granulocytes 1 Not Estab. %   Immature Grans (Abs) 0.1 0.0 - 0.1 x10E3/uL  Comprehensive metabolic panel  Result Value Ref Range   Glucose 79 65 - 99 mg/dL   BUN 9 8 - 27 mg/dL   Creatinine, Ser 1.01 (H) 0.57 - 1.00 mg/dL   eGFR 61 >59 mL/min/1.73   BUN/Creatinine Ratio 9 (L) 12 - 28   Sodium 141 134 - 144 mmol/L   Potassium 4.3 3.5 - 5.2 mmol/L   Chloride 105 96 - 106 mmol/L   CO2 23 20 - 29 mmol/L   Calcium 9.7 8.7 - 10.3 mg/dL   Total Protein 6.5 6.0 - 8.5 g/dL   Albumin 4.6 3.8 - 4.8 g/dL   Globulin, Total 1.9 1.5 - 4.5 g/dL  Albumin/Globulin Ratio 2.4 (H) 1.2 - 2.2   Bilirubin Total  0.2 0.0 - 1.2 mg/dL   Alkaline Phosphatase 102 44 - 121 IU/L   AST 11 0 - 40 IU/L   ALT 14 0 - 32 IU/L  Lipid panel  Result Value Ref Range   Cholesterol, Total 167 100 - 199 mg/dL   Triglycerides 226 (H) 0 - 149 mg/dL   HDL 43 >39 mg/dL   VLDL Cholesterol Cal 38 5 - 40 mg/dL   LDL Chol Calc (NIH) 86 0 - 99 mg/dL   Chol/HDL Ratio 3.9 0.0 - 4.4 ratio  TSH  Result Value Ref Range   TSH 2.520 0.450 - 4.500 uIU/mL  Urinalysis, Routine w reflex microscopic  Result Value Ref Range   Specific Gravity, UA 1.015 1.005 - 1.030   pH, UA 6.0 5.0 - 7.5   Color, UA Yellow Yellow   Appearance Ur Clear Clear   Leukocytes,UA Negative Negative   Protein,UA Negative Negative/Trace   Glucose, UA Negative Negative   Ketones, UA Trace (A) Negative   RBC, UA 1+ (A) Negative   Bilirubin, UA Negative Negative   Urobilinogen, Ur 1.0 0.2 - 1.0 mg/dL   Nitrite, UA Negative Negative   Microscopic Examination See below:   B12  Result Value Ref Range   Vitamin B-12 1,438 (H) 232 - 1,245 pg/mL  Vitamin D (25 hydroxy)  Result Value Ref Range   Vit D, 25-Hydroxy 33.4 30.0 - 100.0 ng/mL      Assessment & Plan:   Problem List Items Addressed This Visit       Cardiovascular and Mediastinum   Hypertension - Primary    Chronic.  Controlled.  Continue with current medication regimen on Lisinopril $RemoveBefor'10mg'[id number]$ .  Refills sent today.  Labs ordered today.  Return to clinic in 6 months for reevaluation.  Call sooner if concerns arise.        Relevant Orders   Comp Met (CMET)     Other   Hyperlipidemia, unspecified    Chronic.  Controlled.  Continue with current medication regimen.  Labs ordered today.  Return to clinic in 6 months for reevaluation.  Call sooner if concerns arise.        Relevant Orders   Lipid Profile   Anxiety    Chronic. Preventing her from sleeping well.  She is hesitant to change from Prozac. Will continue for now and start Trazodone nightly to assist with sleep.  Side effects and  benefits of medication discussed with patient during visit.  Follow up in 1 month for reevaluation.      Relevant Medications   traZODone (DESYREL) 50 MG tablet   Vitamin D deficiency    Labs ordered today. Will make recommendations based on lab results.       Relevant Orders   Vitamin D (25 hydroxy)   Vitamin B12 deficiency    Labs ordered today. Will make recommendations based on lab results.       Relevant Orders   B12   Other Visit Diagnoses     Herpes zoster without complication       Will treat with valacylovir and gabapentin.  Side effects and benefits of medication during visit. FU if symptoms worsen or fail to improve.    Relevant Medications   valACYclovir (VALTREX) 1000 MG tablet        Follow up plan: Return in about 6 months (around 08/14/2022) for Physical and Fasting labs.

## 2022-02-11 NOTE — Assessment & Plan Note (Signed)
Chronic. Preventing her from sleeping well.  She is hesitant to change from Prozac. Will continue for now and start Trazodone nightly to assist with sleep.  Side effects and benefits of medication discussed with patient during visit.  Follow up in 1 month for reevaluation. ?

## 2022-02-12 LAB — COMPREHENSIVE METABOLIC PANEL
ALT: 16 IU/L (ref 0–32)
AST: 15 IU/L (ref 0–40)
Albumin/Globulin Ratio: 1.9 (ref 1.2–2.2)
Albumin: 4.5 g/dL (ref 3.8–4.8)
Alkaline Phosphatase: 128 IU/L — ABNORMAL HIGH (ref 44–121)
BUN/Creatinine Ratio: 6 — ABNORMAL LOW (ref 12–28)
BUN: 6 mg/dL — ABNORMAL LOW (ref 8–27)
Bilirubin Total: 0.4 mg/dL (ref 0.0–1.2)
CO2: 23 mmol/L (ref 20–29)
Calcium: 9.9 mg/dL (ref 8.7–10.3)
Chloride: 104 mmol/L (ref 96–106)
Creatinine, Ser: 0.93 mg/dL (ref 0.57–1.00)
Globulin, Total: 2.4 g/dL (ref 1.5–4.5)
Glucose: 85 mg/dL (ref 70–99)
Potassium: 4.2 mmol/L (ref 3.5–5.2)
Sodium: 143 mmol/L (ref 134–144)
Total Protein: 6.9 g/dL (ref 6.0–8.5)
eGFR: 67 mL/min/{1.73_m2} (ref >59)

## 2022-02-12 LAB — VITAMIN B12: Vitamin B-12: 695 pg/mL (ref 232–1245)

## 2022-02-12 LAB — LIPID PANEL
Chol/HDL Ratio: 3.9 ratio (ref 0.0–4.4)
Cholesterol, Total: 171 mg/dL (ref 100–199)
HDL: 44 mg/dL (ref >39)
LDL Chol Calc (NIH): 103 mg/dL — ABNORMAL HIGH (ref 0–99)
Triglycerides: 135 mg/dL (ref 0–149)
VLDL Cholesterol Cal: 24 mg/dL (ref 5–40)

## 2022-02-12 LAB — VITAMIN D 25 HYDROXY (VIT D DEFICIENCY, FRACTURES): Vit D, 25-Hydroxy: 50.2 ng/mL (ref 30.0–100.0)

## 2022-02-12 NOTE — Progress Notes (Signed)
Hi Ms. Valerie Donovan. It was great to see you yesterday.  Your lab work looks good.  No concerns at this time.  Continue with your current medication regimen.  Follow up as discussed.

## 2022-03-09 ENCOUNTER — Other Ambulatory Visit: Payer: Self-pay | Admitting: Nurse Practitioner

## 2022-03-09 NOTE — Telephone Encounter (Signed)
Medication Refill - Medication: EPINEPHrine (EPIPEN 2-PAK) 0.3 mg/0.3 mL IJ SOAJ injection ? ?Has the patient contacted their pharmacy? Yes.   ?(Agent: If no, request that the patient contact the pharmacy for the refill. If patient does not wish to contact the pharmacy document the reason why and proceed with request.) ?(Agent: If yes, when and what did the pharmacy advise?) ? ?Preferred Pharmacy (with phone number or street name):  ?[facility] [place], [place] AT [facility] OF [address]  ?[place] [address]  ?Phone: [phone] Fax: [phone]  ? ?Has the patient been seen for an appointment in the last year OR does the patient have an upcoming appointment? Yes.   ? ?Agent: Please be advised that RX refills may take up to 3 business days. We ask that you follow-up with your pharmacy. ? ?

## 2022-03-10 MED ORDER — EPINEPHRINE 0.3 MG/0.3ML IJ SOAJ: 0.3000 mg | INTRAMUSCULAR | 1 refills | Status: AC | PRN

## 2022-03-10 NOTE — Telephone Encounter (Signed)
Requested medication (s) are due for refill today: yes ? ?Requested medication (s) are on the active medication list: yes ? ?Last refill: yes ? ?Future visit scheduled 01/07/20  #2  1 ? ?Notes to clinic:Historical provider ? ?Requested Prescriptions  ?Pending Prescriptions Disp Refills  ? EPINEPHrine (EPIPEN 2-PAK) 0.3 mg/0.3 mL IJ SOAJ injection 2 each 1  ?  Sig: Inject 0.3 mg into the muscle as needed for anaphylaxis.  ?  ? Immunology: Antidotes Passed - 03/09/2022  4:07 PM  ?  ?  Passed - Valid encounter within last 12 months  ?  Recent Outpatient Visits   ? ?      ? 3 weeks ago Primary hypertension  ? [place], NP  ? 7 months ago Annual physical exam  ? [place], NP  ? 1 year ago Primary hypertension  ? [place], NP  ? 1 year ago Essential hypertension  ? [place], [place], [place]  ? 2 years ago Essential hypertension  ? [place], [place], [place]  ? ?  ?  ?Future Appointments   ? ?        ? In 2 weeks Jon Billings, NP [facility]  ? In 5 months Jon Billings, NP [facility], [facility]  ? ?  ? ?  ?  ?  ? ? ? ? ?

## 2022-03-24 NOTE — Progress Notes (Signed)
? ?BP (!) 144/74   Pulse (!) 59   Temp 98.5 ?F (36.9 ?C) (Oral)   Wt 131 lb 3.2 oz (59.5 kg)   LMP  (LMP Unknown)   SpO2 96%   BMI 23.42 kg/m?   ? ?Subjective:  ? ? Patient ID: Valerie Donovan, female    DOB: May 12, 1956, 66 y.o.   MRN: [id number] ? ?HPI: ?Valerie Donovan is a 66 y.o. female ? ? ?SLEEP ?Patient states she tried the trazodone but it caused her itching.  States it helped her sleep but the itching on the palms started which is how her allergies started.  Patient states she can tell she needs something for sleep because her mood improved when the medication was working for her.   ? ? ? ? ?Relevant past medical, surgical, family and social history reviewed and updated as indicated. Interim medical history since our last visit reviewed. ?Allergies and medications reviewed and updated. ? ?Review of Systems  ?Eyes:  Negative for visual disturbance.  ?Respiratory:  Negative for cough, chest tightness and shortness of breath.   ?Cardiovascular:  Negative for chest pain, palpitations and leg swelling.  ?Skin:  Positive for rash.  ?Neurological:  Negative for dizziness and headaches.  ?Psychiatric/Behavioral:  Positive for sleep disturbance. The patient is not nervous/anxious.   ? ?Per HPI unless specifically indicated above ? ?   ?Objective:  ?  ?BP (!) 144/74   Pulse (!) 59   Temp 98.5 ?F (36.9 ?C) (Oral)   Wt 131 lb 3.2 oz (59.5 kg)   LMP  (LMP Unknown)   SpO2 96%   BMI 23.42 kg/m?   ?Wt Readings from Last 3 Encounters:  ?03/25/22 131 lb 3.2 oz (59.5 kg)  ?02/11/22 135 lb (61.2 kg)  ?07/29/21 136 lb 6 oz (61.9 kg)  ?  ?Physical Exam ?Vitals and nursing note reviewed.  ?Constitutional:   ?   General: She is not in acute distress. ?   Appearance: Normal appearance. She is normal weight. She is not ill-appearing, toxic-appearing or diaphoretic.  ?HENT:  ?   Head: Normocephalic.  ?   Right Ear: External ear normal.  ?   Left Ear: External ear normal.  ?   Nose: Nose normal.  ?   Mouth/Throat:  ?    Mouth: Mucous membranes are moist.  ?   Pharynx: Oropharynx is clear.  ?Eyes:  ?   General:     ?   Right eye: No discharge.     ?   Left eye: No discharge.  ?   Extraocular Movements: Extraocular movements intact.  ?   Conjunctiva/sclera: Conjunctivae normal.  ?   Pupils: Pupils are equal, round, and reactive to light.  ?Cardiovascular:  ?   Rate and Rhythm: Normal rate and regular rhythm.  ?   Heart sounds: No murmur heard. ?Pulmonary:  ?   Effort: Pulmonary effort is normal. No respiratory distress.  ?   Breath sounds: Normal breath sounds. No wheezing or rales.  ?Musculoskeletal:  ?   Cervical back: Normal range of motion and neck supple.  ?Skin: ?   General: Skin is warm and dry.  ?   Capillary Refill: Capillary refill takes less than 2 seconds.  ? ?    ?Neurological:  ?   General: No focal deficit present.  ?   Mental Status: She is alert and oriented to person, place, and time. Mental status is at baseline.  ?Psychiatric:     ?  Mood and Affect: Mood normal.     ?   Behavior: Behavior normal.     ?   Thought Content: Thought content normal.     ?   Judgment: Judgment normal.  ? ? ?Results for orders placed or performed in visit on 02/11/22  ?Comp Met (CMET)  ?Result Value Ref Range  ? Glucose 85 70 - 99 mg/dL  ? BUN 6 (L) 8 - 27 mg/dL  ? Creatinine, Ser 0.93 0.57 - 1.00 mg/dL  ? eGFR 67 >59 mL/min/1.73  ? BUN/Creatinine Ratio 6 (L) 12 - 28  ? Sodium 143 134 - 144 mmol/L  ? Potassium 4.2 3.5 - 5.2 mmol/L  ? Chloride 104 96 - 106 mmol/L  ? CO2 23 20 - 29 mmol/L  ? Calcium 9.9 8.7 - 10.3 mg/dL  ? Total Protein 6.9 6.0 - 8.5 g/dL  ? Albumin 4.5 3.8 - 4.8 g/dL  ? Globulin, Total 2.4 1.5 - 4.5 g/dL  ? Albumin/Globulin Ratio 1.9 1.2 - 2.2  ? Bilirubin Total 0.4 0.0 - 1.2 mg/dL  ? Alkaline Phosphatase 128 (H) 44 - 121 IU/L  ? AST 15 0 - 40 IU/L  ? ALT 16 0 - 32 IU/L  ?B12  ?Result Value Ref Range  ? Vitamin B-12 695 232 - 1,245 pg/mL  ?Vitamin D (25 hydroxy)  ?Result Value Ref Range  ? Vit D, 25-Hydroxy 50.2 30.0 -  100.0 ng/mL  ?Lipid Profile  ?Result Value Ref Range  ? Cholesterol, Total 171 100 - 199 mg/dL  ? Triglycerides 135 0 - 149 mg/dL  ? HDL 44 >39 mg/dL  ? VLDL Cholesterol Cal 24 5 - 40 mg/dL  ? LDL Chol Calc (NIH) 103 (H) 0 - 99 mg/dL  ? Chol/HDL Ratio 3.9 0.0 - 4.4 ratio  ? ?   ?Assessment & Plan:  ? ?Problem List Items Addressed This Visit   ?None ?Visit Diagnoses   ? ? Sleep disorder    -  Primary  ? Improved with trazodone until it caused her itching. Will change to Mirtazipine.  Side effects and benefits of medication discussed. Follow up in 1 month.  ? ?  ?  ? ?Follow up plan: ?Return in about 1 month (around 04/24/2022) for Sleep Follow up. ? ? ? ? ? ? ?

## 2022-03-25 ENCOUNTER — Ambulatory Visit (INDEPENDENT_AMBULATORY_CARE_PROVIDER_SITE_OTHER): Payer: PPO | Admitting: Nurse Practitioner

## 2022-03-25 ENCOUNTER — Encounter: Payer: Self-pay | Admitting: Nurse Practitioner

## 2022-03-25 VITALS — BP 144/74 | HR 59 | Temp 98.5°F | Wt 131.2 lb

## 2022-03-25 DIAGNOSIS — G479 Sleep disorder, unspecified: Secondary | ICD-10-CM | POA: Diagnosis not present

## 2022-03-25 MED ORDER — MIRTAZAPINE 15 MG PO TABS: 15.0000 mg | ORAL_TABLET | Freq: Every day | ORAL | 0 refills | Status: DC

## 2022-04-11 ENCOUNTER — Other Ambulatory Visit: Payer: Self-pay | Admitting: Nurse Practitioner

## 2022-04-12 NOTE — Telephone Encounter (Signed)
Requested Prescriptions  ?Pending Prescriptions Disp Refills  ?? lisinopril (ZESTRIL) 20 MG tablet [Pharmacy Med Name: LISINOPRIL '20MG'$  TABLETS] 90 tablet 1  ?  Sig: TAKE 1 TABLET(20 MG) BY MOUTH DAILY  ?  ? Cardiovascular:  ACE Inhibitors Failed - 04/11/2022  9:35 AM  ?  ?  Failed - Last BP in normal range  ?  BP Readings from Last 1 Encounters:  ?03/25/22 (!) 144/74  ?   ?  ?  Passed - Cr in normal range and within 180 days  ?  Creatinine, Ser  ?Date Value Ref Range Status  ?02/11/2022 0.93 0.57 - 1.00 mg/dL Final  ?   ?  ?  Passed - K in normal range and within 180 days  ?  Potassium  ?Date Value Ref Range Status  ?02/11/2022 4.2 3.5 - 5.2 mmol/L Final  ?   ?  ?  Passed - Patient is not pregnant  ?  ?  Passed - Valid encounter within last 6 months  ?  Recent Outpatient Visits   ?      ? 2 weeks ago Sleep disorder  ? [place], NP  ? 2 months ago Primary hypertension  ? [place], NP  ? 8 months ago Annual physical exam  ? [place], NP  ? 1 year ago Primary hypertension  ? [place], NP  ? 1 year ago Essential hypertension  ? [place], [place], [place]  ?  ?  ?Future Appointments   ?        ? In 4 months Jon Billings, NP [facility], [facility]  ?  ? ?  ?  ?  ? ?

## 2022-04-14 ENCOUNTER — Telehealth: Payer: Self-pay | Admitting: Nurse Practitioner

## 2022-04-14 NOTE — Telephone Encounter (Signed)
Pt says she wants to continue taking this every month, she wants this added to her monthly supply. She is not currently out of this supply, she has 12 left. Not requesting refill. Says this medication works well for her and she does not have adverse side effects.  ? ?[id number] - [address], [address] AT [address]  ?[place] [address]  ?Phone: [phone] Fax: [phone]  ? ? ?mirtazapine (REMERON) 15 MG tablet  ? ? ?

## 2022-04-15 MED ORDER — MIRTAZAPINE 15 MG PO TABS: 15.0000 mg | ORAL_TABLET | Freq: Every day | ORAL | 1 refills | Status: DC

## 2022-04-15 NOTE — Telephone Encounter (Signed)
Refill of medication sent to the pharmacy. ?

## 2022-04-29 ENCOUNTER — Ambulatory Visit: Payer: PPO | Admitting: Nurse Practitioner

## 2022-07-06 ENCOUNTER — Other Ambulatory Visit: Payer: Self-pay | Admitting: Nurse Practitioner

## 2022-07-07 NOTE — Telephone Encounter (Signed)
Requested Prescriptions  Pending Prescriptions Disp Refills  . FLUoxetine (PROZAC) 40 MG capsule [Pharmacy Med Name: FLUOXETINE '40MG'$  CAPSULES] 90 capsule 0    Sig: TAKE 1 CAPSULE BY MOUTH DAILY     Psychiatry:  Antidepressants - SSRI Passed - 07/06/2022  7:07 AM      Passed - Valid encounter within last 6 months    Recent Outpatient Visits          3 months ago Sleep disorder   [place], NP   4 months ago Primary hypertension   [facility] Jon Billings, NP   11 months ago Annual physical exam   [facility] Jon Billings, NP   1 year ago Primary hypertension   [facility] Jon Billings, NP   1 year ago Essential hypertension   [facility] [place], [place]      Future Appointments            In 1 month Jon Billings, NP [facility], [facility]           . atorvastatin (LIPITOR) 20 MG tablet [Pharmacy Med Name: ATORVASTATIN '20MG'$  TABLETS] 90 tablet 1    Sig: TAKE 1 TABLET(20 MG) BY MOUTH EVERY MORNING     Cardiovascular:  Antilipid - Statins Failed - 07/06/2022  7:07 AM      Failed - Lipid Panel in normal range within the last 12 months    Cholesterol, Total  Date Value Ref Range Status  02/11/2022 171 100 - 199 mg/dL Final   LDL Chol Calc (NIH)  Date Value Ref Range Status  02/11/2022 103 (H) 0 - 99 mg/dL Final   HDL  Date Value Ref Range Status  02/11/2022 44 >39 mg/dL Final   Triglycerides  Date Value Ref Range Status  02/11/2022 135 0 - 149 mg/dL Final         Passed - Patient is not pregnant      Passed - Valid encounter within last 12 months    Recent Outpatient Visits          3 months ago Sleep disorder   [facility] Jon Billings, NP   4 months ago Primary hypertension   [facility] Jon Billings, NP   11 months ago Annual physical exam   [facility] Jon Billings, NP   1 year ago  Primary hypertension   [facility] Jon Billings, NP   1 year ago Essential hypertension   [facility] [place], [place]      Future Appointments            In 1 month Jon Billings, NP [facility], [place]

## 2022-07-28 ENCOUNTER — Other Ambulatory Visit: Payer: Self-pay

## 2022-07-28 DIAGNOSIS — Z1231 Encounter for screening mammogram for malignant neoplasm of breast: Secondary | ICD-10-CM

## 2022-07-30 ENCOUNTER — Encounter: Payer: Self-pay | Admitting: Nurse Practitioner

## 2022-08-17 ENCOUNTER — Ambulatory Visit
Admission: RE | Admit: 2022-08-17 | Discharge: 2022-08-17 | Disposition: A | Discharge status: Home / Self Care | Payer: PPO | Source: Ambulatory Visit | Attending: Nurse Practitioner | Admitting: Nurse Practitioner

## 2022-08-17 DIAGNOSIS — Z1231 Encounter for screening mammogram for malignant neoplasm of breast: Secondary | ICD-10-CM | POA: Insufficient documentation

## 2022-08-18 NOTE — Progress Notes (Addendum)
BP (!) 142/74   Pulse 90   Temp 99 F (37.2 C) (Oral)   Ht 5' 3.39" (1.61 m)   Wt 133 lb (60.3 kg)   LMP  (LMP Unknown)   SpO2 97%   BMI 23.27 kg/m    Subjective:    Patient ID: Valerie Donovan, female    DOB: 1956-03-09, 66 y.o.   MRN: [id number]  HPI: Valerie Donovan is a 66 y.o. female presenting on 08/19/2022 for comprehensive medical examination. Current medical complaints include:none  She currently lives with: Menopausal Symptoms: no  HYPERTENSION / HYPERLIPIDEMIA Satisfied with current treatment? no Duration of hypertension: years BP monitoring frequency:  BP range:  BP medication side effects: no Past BP meds: lisinopril Duration of hyperlipidemia: years Cholesterol medication side effects: no Cholesterol supplements: none Past cholesterol medications: atorvastain (lipitor) Medication compliance: excellent compliance Aspirin: no Recent stressors: no Recurrent headaches: no Visual changes: no Palpitations: no Dyspnea: no Chest pain: no Lower extremity edema: no Dizzy/lightheaded: no  ANXIETY Patient states she is not doing well right now due to finding out about her abnormal mammogram.     Depression Screen done today and results listed below:     08/19/2022    9:57 AM 03/25/2022   10:40 AM 02/11/2022   10:43 AM 12/30/2021   10:49 AM 07/29/2021    2:15 PM  Depression screen PHQ 2/9  Decreased Interest 0 1 0 0 0  Down, Depressed, Hopeless 0 1 0 0 0  PHQ - 2 Score 0 2 0 0 0  Altered sleeping 0 1 3    Tired, decreased energy 0 1 0    Change in appetite 0 1 1    Feeling bad or failure about yourself  0 1 1    Trouble concentrating 0 0 0    Moving slowly or fidgety/restless 0 0 0    Suicidal thoughts 0 0 0    PHQ-9 Score 0 6 5    Difficult doing work/chores Not difficult at all Not difficult at all Somewhat difficult      The patient does not have a history of falls. I did complete a risk assessment for falls. A plan of care for falls was  documented.   Past Medical History:  Past Medical History:  Diagnosis Date  . Allergy   . Anxiety   . Arthritis    FINGERS  . Cataract   . COPD (chronic obstructive pulmonary disease) ([place])   . Depression   . GERD (gastroesophageal reflux disease)   . Hyperlipidemia   . Hypertension   . Postmenopausal bleeding 2014; 10/202018; 05/14/18   endometrial bx done 09/2015 - nl; nl paps    Surgical History:  Past Surgical History:  Procedure Laterality Date  . COLONOSCOPY WITH PROPOFOL N/A 07/20/2019   Procedure: COLONOSCOPY WITH PROPOFOL;  Surgeon: Lucilla Lame, MD;  Location: [place];  Service: Endoscopy;  Laterality: N/A;  . HYSTEROSCOPY WITH D & C N/A 06/15/2018   Procedure: DILATATION AND CURETTAGE /HYSTEROSCOPY;  Surgeon: Homero Fellers, MD;  Location: [facility];  Service: Gynecology;  Laterality: N/A;  . POLYPECTOMY  07/20/2019   Procedure: POLYPECTOMY;  Surgeon: Lucilla Lame, MD;  Location: [place];  Service: Endoscopy;;  . TUBAL LIGATION    . WISDOM TOOTH EXTRACTION      Medications:  Current Outpatient Medications on File Prior to Visit  Medication Sig  . atorvastatin (LIPITOR) 20 MG tablet TAKE 1 TABLET(20 MG) BY MOUTH EVERY MORNING  .  EPINEPHrine (EPIPEN 2-PAK) 0.3 mg/0.3 mL IJ SOAJ injection Inject 0.3 mg into the muscle as needed for anaphylaxis.  [name] FLUoxetine (PROZAC) 40 MG capsule TAKE 1 CAPSULE BY MOUTH DAILY  . fluticasone (FLONASE) 50 MCG/ACT nasal spray Place 2 sprays into both nostrils daily.  [name] gabapentin (NEURONTIN) 100 MG capsule Take 1 capsule (100 mg total) by mouth 3 (three) times daily.  [name] lisinopril (ZESTRIL) 20 MG tablet TAKE 1 TABLET(20 MG) BY MOUTH DAILY  . mirtazapine (REMERON) 15 MG tablet Take 1 tablet (15 mg total) by mouth at bedtime.  [name] omeprazole (PRILOSEC) 20 MG capsule Take 1 capsule (20 mg total) by mouth daily as needed (for acid reflux/indigestion.).  [name] Melatonin 10 MG TABS Take 10 mg by mouth at bedtime.  (Patient not taking: Reported on 08/19/2022)   No current facility-administered medications on file prior to visit.    Allergies:  Allergies  Allergen Reactions  . Ciprofloxacin Anaphylaxis  . Clarithromycin Anaphylaxis  . Other Anaphylaxis    ANTIBIOTICS-PT CAN TOLERATE Z-PAC AND DOXYCYCLINE BUT ALL OTHERS SHE CANNOT  . Penicillin G Anaphylaxis    Has patient had a PCN reaction causing immediate rash, facial/tongue/throat swelling, SOB or lightheadedness with hypotension: Yes Has patient had a PCN reaction causing severe rash involving mucus membranes or skin necrosis: No Has patient had a PCN reaction that required hospitalization: No Has patient had a PCN reaction occurring within the last 10 years: No If all of the above answers are "NO", then may proceed with Cephalosporin use.   . Codeine Itching    vomiting  . Fluoride Preparations Hives  . Trazodone Itching    Social History:  Social History   Socioeconomic History  . Marital status: Divorced    Spouse name: Not on file  . Number of children: Not on file  . Years of education: Not on file  . Highest education level: Not on file  Occupational History  . Occupation: retired  Tobacco Use  . Smoking status: Every Day    Packs/day: 0.50    Years: 44.00    Total pack years: 22.00    Types: Cigarettes  . Smokeless tobacco: Never  Vaping Use  . Vaping Use: Former  Substance and Sexual Activity  . Alcohol use: No  . Drug use: No    Comment: CBD OIL IN JUUL  . Sexual activity: Not Currently    Birth control/protection: Post-menopausal  Other Topics Concern  . Not on file  Social History Narrative  . Not on file   Social Determinants of Health   Financial Resource Strain: Low Risk  (12/30/2021)   Overall Financial Resource Strain (CARDIA)   . Difficulty of Paying Living Expenses: Not very hard  Food Insecurity: No Food Insecurity (12/30/2021)   Hunger Vital Sign   . Worried About Charity fundraiser in the  Last Year: Never true   . Ran Out of Food in the Last Year: Never true  Transportation Needs: No Transportation Needs (12/30/2021)   PRAPARE - Transportation   . Lack of Transportation (Medical): No   . Lack of Transportation (Non-Medical): No  Physical Activity: Sufficiently Active (12/30/2021)   Exercise Vital Sign   . Days of Exercise per Week: 4 days   . Minutes of Exercise per Session: 50 min  Stress: No Stress Concern Present (12/30/2021)   [place]   . Feeling of Stress : Only a little  Social Connections: Socially Isolated (12/30/2021)  Social Connection and Isolation Panel [NHANES]   . Frequency of Communication with Friends and Family: More than three times a week   . Frequency of Social Gatherings with Friends and Family: More than three times a week   . Attends Religious Services: Never   . Active Member of Clubs or Organizations: No   . Attends Archivist Meetings: Never   . Marital Status: Divorced  Human resources officer Violence: Not At Risk (12/30/2021)   Humiliation, Afraid, Rape, and Kick questionnaire   . Fear of Current or Ex-Partner: No   . Emotionally Abused: No   . Physically Abused: No   . Sexually Abused: No   Social History   Tobacco Use  Smoking Status Every Day  . Packs/day: 0.50  . Years: 44.00  . Total pack years: 22.00  . Types: Cigarettes  Smokeless Tobacco Never   Social History   Substance and Sexual Activity  Alcohol Use No    Family History:  Family History  Problem Relation Age of Onset  . Cancer Mother   . Diabetes Mother   . Early death Mother   . Cancer Father        colon  . Arthritis Father   . Diabetes Sister   . Diabetes Maternal Grandmother   . Diabetes Maternal Grandfather   . Diabetes Sister     Past medical history, surgical history, medications, allergies, family history and social history reviewed with patient today and changes made to  appropriate areas of the chart.   Review of Systems  Eyes:  Negative for blurred vision and double vision.  Respiratory:  Negative for shortness of breath.   Cardiovascular:  Negative for chest pain, palpitations and leg swelling.  Neurological:  Negative for dizziness and headaches.  Psychiatric/Behavioral:  Positive for depression. Negative for suicidal ideas. The patient is nervous/anxious.    All other ROS negative except what is listed above and in the HPI.      Objective:    BP (!) 142/74   Pulse 90   Temp 99 F (37.2 C) (Oral)   Ht 5' 3.39" (1.61 m)   Wt 133 lb (60.3 kg)   LMP  (LMP Unknown)   SpO2 97%   BMI 23.27 kg/m   Wt Readings from Last 3 Encounters:  08/19/22 133 lb (60.3 kg)  03/25/22 131 lb 3.2 oz (59.5 kg)  02/11/22 135 lb (61.2 kg)    Physical Exam Vitals and nursing note reviewed.  Constitutional:      General: She is awake. She is not in acute distress.    Appearance: Normal appearance. She is well-developed and normal weight. She is not ill-appearing.  HENT:     Head: Normocephalic and atraumatic.     Right Ear: Hearing, tympanic membrane, ear canal and external ear normal. No drainage.     Left Ear: Hearing, tympanic membrane, ear canal and external ear normal. No drainage.     Nose: Nose normal.     Right Sinus: No maxillary sinus tenderness or frontal sinus tenderness.     Left Sinus: No maxillary sinus tenderness or frontal sinus tenderness.     Mouth/Throat:     Mouth: Mucous membranes are moist.     Pharynx: Oropharynx is clear. Uvula midline. No pharyngeal swelling, oropharyngeal exudate or posterior oropharyngeal erythema.  Eyes:     General: Lids are normal.        Right eye: No discharge.        Left eye:  No discharge.     Extraocular Movements: Extraocular movements intact.     Conjunctiva/sclera: Conjunctivae normal.     Pupils: Pupils are equal, round, and reactive to light.     Visual Fields: Right eye visual fields normal and left  eye visual fields normal.  Neck:     Thyroid: No thyromegaly.     Vascular: No carotid bruit.     Trachea: Trachea normal.  Cardiovascular:     Rate and Rhythm: Normal rate and regular rhythm.     Heart sounds: Normal heart sounds. No murmur heard.    No gallop.  Pulmonary:     Effort: Pulmonary effort is normal. No accessory muscle usage or respiratory distress.     Breath sounds: Normal breath sounds.  Chest:  Breasts:    Right: Normal.     Left: Normal.  Abdominal:     General: Bowel sounds are normal.     Palpations: Abdomen is soft. There is no hepatomegaly or splenomegaly.     Tenderness: There is no abdominal tenderness.  Musculoskeletal:        General: Normal range of motion.     Cervical back: Normal range of motion and neck supple.     Right lower leg: No edema.     Left lower leg: No edema.  Lymphadenopathy:     Head:     Right side of head: No submental, submandibular, tonsillar, preauricular or posterior auricular adenopathy.     Left side of head: No submental, submandibular, tonsillar, preauricular or posterior auricular adenopathy.     Cervical: No cervical adenopathy.     Upper Body:     Right upper body: No supraclavicular, axillary or pectoral adenopathy.     Left upper body: No supraclavicular, axillary or pectoral adenopathy.  Skin:    General: Skin is warm and dry.     Capillary Refill: Capillary refill takes less than 2 seconds.     Findings: No rash.  Neurological:     Mental Status: She is alert and oriented to person, place, and time.     Gait: Gait is intact.     Deep Tendon Reflexes: Reflexes are normal and symmetric.     Reflex Scores:      Brachioradialis reflexes are 2+ on the right side and 2+ on the left side.      Patellar reflexes are 2+ on the right side and 2+ on the left side. Psychiatric:        Attention and Perception: Attention normal.        Mood and Affect: Mood normal.        Speech: Speech normal.        Behavior: Behavior  normal. Behavior is cooperative.        Thought Content: Thought content normal.        Judgment: Judgment normal.     Comments: Tearful on exam    Results for orders placed or performed in visit on 08/19/22  Microscopic Examination   Urine  Result Value Ref Range   WBC, UA 6-10 (A) 0 - 5 /hpf   RBC, Urine 0-2 0 - 2 /hpf   Epithelial Cells (non renal) 0-10 0 - 10 /hpf   Bacteria, UA None seen None seen/Few  Urinalysis, Routine w reflex microscopic  Result Value Ref Range   Specific Gravity, UA 1.010 1.005 - 1.030   pH, UA 5.5 5.0 - 7.5   Color, UA Yellow Yellow   Appearance Ur Clear  Clear   Leukocytes,UA 1+ (A) Negative   Protein,UA Negative Negative/Trace   Glucose, UA Negative Negative   Ketones, UA Negative Negative   RBC, UA 2+ (A) Negative   Bilirubin, UA Negative Negative   Urobilinogen, Ur 0.2 0.2 - 1.0 mg/dL   Nitrite, UA Negative Negative   Microscopic Examination See below:       Assessment & Plan:   Problem List Items Addressed This Visit       Cardiovascular and Mediastinum   Hypertension    Chronic. Elevated at visit today. Patient is very stressed about her abnormal Mammogram results. Will follow up in 1 month for reevaluation.  Labs ordered.  Will adjust medications if needed based on follow up.        Other   Hyperlipidemia, unspecified    Chronic.  Controlled.  Continue with current medication regimen.  Labs ordered today.  Return to clinic in 6 months for reevaluation.  Call sooner if concerns arise.        Anxiety    Chronic. Not well controlled due to abnormal mammogram.  Follow up in 1 month for reevaluation.  Will adjust medications if needed at that time.       Vitamin D deficiency    Labs ordered at visit today.  Will make recommendations based on lab results.        Relevant Orders   Vitamin D (25 hydroxy)   Vitamin B12 deficiency    Labs ordered at visit today.  Will make recommendations based on lab results.       Relevant  Orders   B12   Other Visit Diagnoses     Annual physical exam    -  Primary   Health maintenance reviewed during visit today. Labs ordered. Mammogram up to date. Colonoscopy up to date. Can't get vaccines.   Relevant Orders   CBC with Differential/Platelet   Comprehensive metabolic panel   TSH   Urinalysis, Routine w reflex microscopic (Completed)   Abnormal mammogram       Working to get patient in sooner than September 29 for diagnostic and [place].   Relevant Orders   US BREAST LTD UNI LEFT INC AXILLA   MM DIAG BREAST TOMO UNI LEFT   Screening for ischemic heart disease       Relevant Orders   Lipid panel   Abnormal urinalysis       Relevant Orders   Urine Culture        Follow up plan: Return in about 1 month (around 09/18/2022) for BP Check, Depression/Anxiety FU.   LABORATORY TESTING:  - Pap smear: not applicable  IMMUNIZATIONS:   - Tdap: Tetanus vaccination status reviewed: last tetanus booster within 10 years. - Influenza: Refused - Pneumovax: Refused- allergic to vaccines - Prevnar: Refused - HPV: Not applicable - Zostavax vaccine: Refused  SCREENING: -Mammogram: up to date - Colonoscopy: Up to date  - Bone Density: Up to date  -Hearing Test: Not applicable  -Spirometry: Not applicable   PATIENT COUNSELING:   Advised to take 1 mg of folate supplement per day if capable of pregnancy.   Sexuality: Discussed sexually transmitted diseases, partner selection, use of condoms, avoidance of unintended pregnancy  and contraceptive alternatives.   Advised to avoid cigarette smoking.  I discussed with the patient that most people either abstain from alcohol or drink within safe limits (<=14/week and <=4 drinks/occasion for males, <=7/weeks and <= 3 drinks/occasion for females) and that the risk for alcohol disorders  and other health effects rises proportionally with the number of drinks per week and how often a drinker exceeds daily limits.  Discussed  cessation/primary prevention of drug use and availability of treatment for abuse.   Diet: Encouraged to adjust caloric intake to maintain  or achieve ideal body weight, to reduce intake of dietary saturated fat and total fat, to limit sodium intake by avoiding high sodium foods and not adding table salt, and to maintain adequate dietary potassium and calcium preferably from fresh fruits, vegetables, and low-fat dairy products.    stressed the importance of regular exercise  Injury prevention: Discussed safety belts, safety helmets, smoke detector, smoking near bedding or upholstery.   Dental health: Discussed importance of regular tooth brushing, flossing, and dental visits.    NEXT PREVENTATIVE PHYSICAL DUE IN 1 YEAR. Return in about 1 month (around 09/18/2022) for BP Check, Depression/Anxiety FU.

## 2022-08-19 ENCOUNTER — Ambulatory Visit (INDEPENDENT_AMBULATORY_CARE_PROVIDER_SITE_OTHER): Payer: PPO | Admitting: Nurse Practitioner

## 2022-08-19 ENCOUNTER — Encounter: Payer: Self-pay | Admitting: Nurse Practitioner

## 2022-08-19 VITALS — BP 142/74 | HR 90 | Temp 99.0°F | Ht 63.39 in | Wt 133.0 lb

## 2022-08-19 DIAGNOSIS — I10 Essential (primary) hypertension: Secondary | ICD-10-CM | POA: Diagnosis not present

## 2022-08-19 DIAGNOSIS — E7800 Pure hypercholesterolemia, unspecified: Secondary | ICD-10-CM | POA: Diagnosis not present

## 2022-08-19 DIAGNOSIS — Z136 Encounter for screening for cardiovascular disorders: Secondary | ICD-10-CM

## 2022-08-19 DIAGNOSIS — E538 Deficiency of other specified B group vitamins: Secondary | ICD-10-CM | POA: Diagnosis not present

## 2022-08-19 DIAGNOSIS — R928 Other abnormal and inconclusive findings on diagnostic imaging of breast: Secondary | ICD-10-CM

## 2022-08-19 DIAGNOSIS — Z0000 Encounter for general adult medical examination without abnormal findings: Secondary | ICD-10-CM | POA: Diagnosis not present

## 2022-08-19 DIAGNOSIS — F419 Anxiety disorder, unspecified: Secondary | ICD-10-CM | POA: Diagnosis not present

## 2022-08-19 DIAGNOSIS — E559 Vitamin D deficiency, unspecified: Secondary | ICD-10-CM | POA: Diagnosis not present

## 2022-08-19 DIAGNOSIS — R8290 Unspecified abnormal findings in urine: Secondary | ICD-10-CM

## 2022-08-19 LAB — URINALYSIS, ROUTINE W REFLEX MICROSCOPIC
Bilirubin, UA: NEGATIVE
Glucose, UA: NEGATIVE
Ketones, UA: NEGATIVE
Nitrite, UA: NEGATIVE
Protein,UA: NEGATIVE
Specific Gravity, UA: 1.01 (ref 1.005–1.030)
Urobilinogen, Ur: 0.2 mg/dL (ref 0.2–1.0)
pH, UA: 5.5 (ref 5.0–7.5)

## 2022-08-19 LAB — MICROSCOPIC EXAMINATION: Bacteria, UA: NONE SEEN

## 2022-08-19 NOTE — Assessment & Plan Note (Signed)
Labs ordered at visit today.  Will make recommendations based on lab results.   

## 2022-08-19 NOTE — Assessment & Plan Note (Signed)
Chronic.  Controlled.  Continue with current medication regimen.  Labs ordered today.  Return to clinic in 6 months for reevaluation.  Call sooner if concerns arise.  ? ?

## 2022-08-19 NOTE — Progress Notes (Signed)
Please let patient know that her Mammogram showed some asymmetry.  It is recommended that she schedule a diagnostic mammogram and possible ultrasound.  Please make sure she has this scheduled.

## 2022-08-19 NOTE — Assessment & Plan Note (Signed)
Chronic. Elevated at visit today. Patient is very stressed about her abnormal Mammogram results. Will follow up in 1 month for reevaluation.  Labs ordered.  Will adjust medications if needed based on follow up.

## 2022-08-19 NOTE — Addendum Note (Signed)
Addended by: Jon Billings on: 08/19/2022 10:20 AM   Modules accepted: Orders

## 2022-08-19 NOTE — Assessment & Plan Note (Signed)
Chronic. Not well controlled due to abnormal mammogram.  Follow up in 1 month for reevaluation.  Will adjust medications if needed at that time.

## 2022-08-20 LAB — CBC WITH DIFFERENTIAL/PLATELET
Basophils Absolute: 0 10*3/uL (ref 0.0–0.2)
Basos: 0 %
EOS (ABSOLUTE): 0.3 10*3/uL (ref 0.0–0.4)
Eos: 2 %
Hematocrit: 42.2 % (ref 34.0–46.6)
Hemoglobin: 14.2 g/dL (ref 11.1–15.9)
Immature Grans (Abs): 0 10*3/uL (ref 0.0–0.1)
Immature Granulocytes: 0 %
Lymphocytes Absolute: 3 10*3/uL (ref 0.7–3.1)
Lymphs: 25 %
MCH: 31.6 pg (ref 26.6–33.0)
MCHC: 33.6 g/dL (ref 31.5–35.7)
MCV: 94 fL (ref 79–97)
Monocytes Absolute: 0.8 10*3/uL (ref 0.1–0.9)
Monocytes: 7 %
Neutrophils Absolute: 7.7 10*3/uL — ABNORMAL HIGH (ref 1.4–7.0)
Neutrophils: 66 %
Platelets: 333 10*3/uL (ref 150–450)
RBC: 4.49 x10E6/uL (ref 3.77–5.28)
RDW: 12.1 % (ref 11.7–15.4)
WBC: 11.8 10*3/uL — ABNORMAL HIGH (ref 3.4–10.8)

## 2022-08-20 LAB — COMPREHENSIVE METABOLIC PANEL
ALT: 10 IU/L (ref 0–32)
AST: 12 IU/L (ref 0–40)
Albumin/Globulin Ratio: 1.9 (ref 1.2–2.2)
Albumin: 4.6 g/dL (ref 3.9–4.9)
Alkaline Phosphatase: 131 IU/L — ABNORMAL HIGH (ref 44–121)
BUN/Creatinine Ratio: 10 — ABNORMAL LOW (ref 12–28)
BUN: 10 mg/dL (ref 8–27)
Bilirubin Total: 0.3 mg/dL (ref 0.0–1.2)
CO2: 23 mmol/L (ref 20–29)
Calcium: 9.8 mg/dL (ref 8.7–10.3)
Chloride: 104 mmol/L (ref 96–106)
Creatinine, Ser: 0.96 mg/dL (ref 0.57–1.00)
Globulin, Total: 2.4 g/dL (ref 1.5–4.5)
Glucose: 89 mg/dL (ref 70–99)
Potassium: 3.9 mmol/L (ref 3.5–5.2)
Sodium: 143 mmol/L (ref 134–144)
Total Protein: 7 g/dL (ref 6.0–8.5)
eGFR: 64 mL/min/{1.73_m2} (ref >59)

## 2022-08-20 LAB — LIPID PANEL
Chol/HDL Ratio: 3.7 ratio (ref 0.0–4.4)
Cholesterol, Total: 155 mg/dL (ref 100–199)
HDL: 42 mg/dL (ref >39)
LDL Chol Calc (NIH): 87 mg/dL (ref 0–99)
Triglycerides: 148 mg/dL (ref 0–149)
VLDL Cholesterol Cal: 26 mg/dL (ref 5–40)

## 2022-08-20 LAB — VITAMIN B12: Vitamin B-12: 777 pg/mL (ref 232–1245)

## 2022-08-20 LAB — TSH: TSH: 2.53 u[IU]/mL (ref 0.450–4.500)

## 2022-08-20 LAB — VITAMIN D 25 HYDROXY (VIT D DEFICIENCY, FRACTURES): Vit D, 25-Hydroxy: 39.4 ng/mL (ref 30.0–100.0)

## 2022-08-20 NOTE — Progress Notes (Signed)
Please let patient know that her lab work looks good.  White blood cell count is slightly elevated but could be due to the acute stress she is experiencing.  No other concerns at this time.  The September 29 appt is the soonest we could get her for the mammogram. No other concerns at this time.

## 2022-08-21 LAB — URINE CULTURE

## 2022-08-23 NOTE — Progress Notes (Signed)
HI Ms. Valerie Donovan.  Your urine was abnormal last week so I sent it to make sure there was no bacteria growing.  There is not bacteria so there is no need for antibiotics.  Follow up as discussed.

## 2022-09-03 ENCOUNTER — Ambulatory Visit
Admission: RE | Admit: 2022-09-03 | Discharge: 2022-09-03 | Disposition: A | Discharge status: Home / Self Care | Payer: PPO | Source: Ambulatory Visit | Attending: Nurse Practitioner | Admitting: Nurse Practitioner

## 2022-09-03 DIAGNOSIS — N6489 Other specified disorders of breast: Secondary | ICD-10-CM | POA: Diagnosis not present

## 2022-09-03 DIAGNOSIS — R928 Other abnormal and inconclusive findings on diagnostic imaging of breast: Secondary | ICD-10-CM | POA: Diagnosis not present

## 2022-09-03 NOTE — Progress Notes (Signed)
Please let patient know her Mammogram did not show any evidence of a malignancy.  The recommendation is to repeat the Mammogram in 1 year.  

## 2022-09-14 ENCOUNTER — Telehealth: Payer: Self-pay | Admitting: Nurse Practitioner

## 2022-09-15 ENCOUNTER — Ambulatory Visit: Payer: Self-pay | Admitting: *Deleted

## 2022-09-15 NOTE — Telephone Encounter (Signed)
Disregard! Patient found her bottle, she apologizes she says this was her mistake.

## 2022-09-15 NOTE — Telephone Encounter (Signed)
Pt called to report that she is completely out of her current supply, she has not had medication in days. She just had a CPE on 08/19/2022. She is unsure why she is not receiving her refill, her bottle is empty she says.

## 2022-09-15 NOTE — Telephone Encounter (Signed)
Summary: med refuse pt states bp was too high at last visit/out of med/visit 10/16   Pt called back, she found her medication no call needed   ----- Message from Loma [place] sent at 09/15/2022  9:58 AM EDT -----  lisinopril (ZESTRIL) 20 MG tablet  Med was refused, pls fu with pt to explain [phone]     Call closed- patient found medication

## 2022-09-15 NOTE — Telephone Encounter (Signed)
Requested Prescriptions  Pending Prescriptions Disp Refills  . lisinopril (ZESTRIL) 20 MG tablet [Pharmacy Med Name: LISINOPRIL '20MG'$  TABLETS] 90 tablet 1    Sig: TAKE 1 TABLET(20 MG) BY MOUTH DAILY     Cardiovascular:  ACE Inhibitors Failed - 09/14/2022  3:29 PM      Failed - Last BP in normal range    BP Readings from Last 1 Encounters:  08/19/22 (!) 142/74         Passed - Cr in normal range and within 180 days    Creatinine, Ser  Date Value Ref Range Status  08/19/2022 0.96 0.57 - 1.00 mg/dL Final         Passed - K in normal range and within 180 days    Potassium  Date Value Ref Range Status  08/19/2022 3.9 3.5 - 5.2 mmol/L Final         Passed - Patient is not pregnant      Passed - Valid encounter within last 6 months    Recent Outpatient Visits          3 weeks ago Annual physical exam   [place], NP   5 months ago Sleep disorder   [facility] Jon Billings, NP   7 months ago Primary hypertension   [facility] Jon Billings, NP   1 year ago Annual physical exam   [facility] Jon Billings, NP   1 year ago Primary hypertension   [facility] Jon Billings, NP      Future Appointments            In 5 days Jon Billings, NP [facility], [place]

## 2022-09-20 ENCOUNTER — Ambulatory Visit: Payer: PPO | Admitting: Nurse Practitioner

## 2022-09-23 ENCOUNTER — Inpatient Hospital Stay (HOSPITAL_COMMUNITY)
Admit: 2022-09-23 | Discharge: 2022-09-23 | Disposition: A | Discharge status: Home / Self Care | Payer: PPO | Attending: Cardiovascular Disease | Admitting: Cardiovascular Disease

## 2022-09-23 ENCOUNTER — Emergency Department: Payer: PPO

## 2022-09-23 ENCOUNTER — Encounter: Payer: Self-pay | Admitting: Intensive Care

## 2022-09-23 ENCOUNTER — Other Ambulatory Visit: Payer: Self-pay

## 2022-09-23 ENCOUNTER — Inpatient Hospital Stay
Admission: EM | Admit: 2022-09-23 | Discharge: 2022-09-24 | DRG: 280 | Disposition: A | Discharge status: Home / Self Care | Payer: PPO | Attending: Internal Medicine | Admitting: Internal Medicine

## 2022-09-23 ENCOUNTER — Encounter
Admission: EM | Disposition: A | Discharge status: Home / Self Care | Payer: Self-pay | Source: Home / Self Care | Attending: Internal Medicine

## 2022-09-23 DIAGNOSIS — Z881 Allergy status to other antibiotic agents status: Secondary | ICD-10-CM | POA: Diagnosis not present

## 2022-09-23 DIAGNOSIS — R079 Chest pain, unspecified: Principal | ICD-10-CM

## 2022-09-23 DIAGNOSIS — E7800 Pure hypercholesterolemia, unspecified: Secondary | ICD-10-CM | POA: Diagnosis not present

## 2022-09-23 DIAGNOSIS — Z79899 Other long term (current) drug therapy: Secondary | ICD-10-CM | POA: Diagnosis not present

## 2022-09-23 DIAGNOSIS — E785 Hyperlipidemia, unspecified: Secondary | ICD-10-CM

## 2022-09-23 DIAGNOSIS — R0789 Other chest pain: Secondary | ICD-10-CM | POA: Diagnosis not present

## 2022-09-23 DIAGNOSIS — I959 Hypotension, unspecified: Secondary | ICD-10-CM | POA: Diagnosis not present

## 2022-09-23 DIAGNOSIS — I10 Essential (primary) hypertension: Secondary | ICD-10-CM | POA: Diagnosis not present

## 2022-09-23 DIAGNOSIS — Z885 Allergy status to narcotic agent status: Secondary | ICD-10-CM

## 2022-09-23 DIAGNOSIS — F32A Depression, unspecified: Secondary | ICD-10-CM | POA: Diagnosis present

## 2022-09-23 DIAGNOSIS — J440 Chronic obstructive pulmonary disease with acute lower respiratory infection: Secondary | ICD-10-CM | POA: Diagnosis not present

## 2022-09-23 DIAGNOSIS — I2510 Atherosclerotic heart disease of native coronary artery without angina pectoris: Secondary | ICD-10-CM | POA: Diagnosis not present

## 2022-09-23 DIAGNOSIS — I5181 Takotsubo syndrome: Secondary | ICD-10-CM | POA: Diagnosis not present

## 2022-09-23 DIAGNOSIS — Z8261 Family history of arthritis: Secondary | ICD-10-CM | POA: Diagnosis not present

## 2022-09-23 DIAGNOSIS — Z720 Tobacco use: Secondary | ICD-10-CM

## 2022-09-23 DIAGNOSIS — F419 Anxiety disorder, unspecified: Secondary | ICD-10-CM | POA: Diagnosis not present

## 2022-09-23 DIAGNOSIS — J189 Pneumonia, unspecified organism: Secondary | ICD-10-CM | POA: Diagnosis not present

## 2022-09-23 DIAGNOSIS — Z833 Family history of diabetes mellitus: Secondary | ICD-10-CM

## 2022-09-23 DIAGNOSIS — F17210 Nicotine dependence, cigarettes, uncomplicated: Secondary | ICD-10-CM | POA: Diagnosis not present

## 2022-09-23 DIAGNOSIS — I214 Non-ST elevation (NSTEMI) myocardial infarction: Secondary | ICD-10-CM

## 2022-09-23 DIAGNOSIS — E782 Mixed hyperlipidemia: Secondary | ICD-10-CM | POA: Diagnosis not present

## 2022-09-23 DIAGNOSIS — A419 Sepsis, unspecified organism: Secondary | ICD-10-CM | POA: Diagnosis not present

## 2022-09-23 DIAGNOSIS — R112 Nausea with vomiting, unspecified: Secondary | ICD-10-CM | POA: Diagnosis present

## 2022-09-23 DIAGNOSIS — R7989 Other specified abnormal findings of blood chemistry: Secondary | ICD-10-CM

## 2022-09-23 DIAGNOSIS — Z1152 Encounter for screening for COVID-19: Secondary | ICD-10-CM | POA: Diagnosis not present

## 2022-09-23 DIAGNOSIS — K219 Gastro-esophageal reflux disease without esophagitis: Secondary | ICD-10-CM | POA: Diagnosis present

## 2022-09-23 DIAGNOSIS — Z888 Allergy status to other drugs, medicaments and biological substances status: Secondary | ICD-10-CM

## 2022-09-23 DIAGNOSIS — I209 Angina pectoris, unspecified: Secondary | ICD-10-CM | POA: Insufficient documentation

## 2022-09-23 DIAGNOSIS — Z880 Allergy status to penicillin: Secondary | ICD-10-CM

## 2022-09-23 DIAGNOSIS — F418 Other specified anxiety disorders: Secondary | ICD-10-CM | POA: Diagnosis present

## 2022-09-23 DIAGNOSIS — Z800 Family history of malignant neoplasm of digestive organs: Secondary | ICD-10-CM | POA: Diagnosis not present

## 2022-09-23 DIAGNOSIS — R778 Other specified abnormalities of plasma proteins: Secondary | ICD-10-CM | POA: Diagnosis not present

## 2022-09-23 DIAGNOSIS — J449 Chronic obstructive pulmonary disease, unspecified: Secondary | ICD-10-CM | POA: Diagnosis present

## 2022-09-23 DIAGNOSIS — E872 Acidosis, unspecified: Secondary | ICD-10-CM | POA: Diagnosis present

## 2022-09-23 HISTORY — PX: LEFT HEART CATH AND CORONARY ANGIOGRAPHY: CATH118249

## 2022-09-23 HISTORY — DX: Non-ST elevation (NSTEMI) myocardial infarction: I21.4

## 2022-09-23 LAB — BASIC METABOLIC PANEL
Anion gap: 10 (ref 5–15)
BUN: 14 mg/dL (ref 8–23)
CO2: 23 mmol/L (ref 22–32)
Calcium: 9.5 mg/dL (ref 8.9–10.3)
Chloride: 108 mmol/L (ref 98–111)
Creatinine, Ser: 0.99 mg/dL (ref 0.44–1.00)
GFR, Estimated: >60 mL/min (ref >60)
Glucose, Bld: 154 mg/dL — ABNORMAL HIGH (ref 70–99)
Potassium: 3.5 mmol/L (ref 3.5–5.1)
Sodium: 141 mmol/L (ref 135–145)

## 2022-09-23 LAB — APTT: aPTT: 32 seconds (ref 24–36)

## 2022-09-23 LAB — CBC
HCT: 42.4 % (ref 36.0–46.0)
Hemoglobin: 13.7 g/dL (ref 12.0–15.0)
MCH: 31 pg (ref 26.0–34.0)
MCHC: 32.3 g/dL (ref 30.0–36.0)
MCV: 95.9 fL (ref 80.0–100.0)
Platelets: 365 10*3/uL (ref 150–400)
RBC: 4.42 MIL/uL (ref 3.87–5.11)
RDW: 12.4 % (ref 11.5–15.5)
WBC: 25.8 10*3/uL — ABNORMAL HIGH (ref 4.0–10.5)
nRBC: 0 % (ref 0.0–0.2)

## 2022-09-23 LAB — PROTIME-INR
INR: 1 (ref 0.8–1.2)
Prothrombin Time: 13 seconds (ref 11.4–15.2)

## 2022-09-23 LAB — LIPID PANEL
Cholesterol: 164 mg/dL (ref 0–200)
HDL: 47 mg/dL (ref >40)
LDL Cholesterol: 97 mg/dL (ref 0–99)
Total CHOL/HDL Ratio: 3.5 RATIO
Triglycerides: 100 mg/dL (ref <150)
VLDL: 20 mg/dL (ref 0–40)

## 2022-09-23 LAB — HEMOGLOBIN A1C
Hgb A1c MFr Bld: 5.7 % — ABNORMAL HIGH (ref 4.8–5.6)
Mean Plasma Glucose: 116.89 mg/dL

## 2022-09-23 LAB — LACTIC ACID, PLASMA: Lactic Acid, Venous: 2 mmol/L (ref 0.5–1.9)

## 2022-09-23 LAB — ECHOCARDIOGRAM COMPLETE
AR max vel: 3.3 cm2
AV Area VTI: 3.68 cm2
AV Area mean vel: 3.36 cm2
AV Mean grad: 5 mmHg
AV Peak grad: 8.6 mmHg
Ao pk vel: 1.47 m/s
Area-P 1/2: 5.62 cm2
Height: 63 in
S' Lateral: 2.8 cm
Weight: 2144 oz

## 2022-09-23 LAB — TROPONIN I (HIGH SENSITIVITY)
Troponin I (High Sensitivity): 2523 ng/L (ref <18)
Troponin I (High Sensitivity): 2934 ng/L (ref <18)

## 2022-09-23 LAB — PROCALCITONIN: Procalcitonin: <0.1 ng/mL

## 2022-09-23 SURGERY — LEFT HEART CATH AND CORONARY ANGIOGRAPHY
Anesthesia: Moderate Sedation

## 2022-09-23 MED ORDER — ATORVASTATIN CALCIUM 80 MG PO TABS
80.0000 mg | ORAL_TABLET | Freq: Every evening | ORAL | Status: DC
  Administered 2022-09-23: 80 mg via ORAL
  Filled 2022-09-23: qty 1

## 2022-09-23 MED ORDER — FENTANYL CITRATE (PF) 100 MCG/2ML IJ SOLN
INTRAMUSCULAR | Status: DC | PRN
  Administered 2022-09-23: 25 ug via INTRAVENOUS

## 2022-09-23 MED ORDER — MIDAZOLAM HCL 2 MG/2ML IJ SOLN
INTRAMUSCULAR | Status: DC | PRN
  Administered 2022-09-23: 1 mg via INTRAVENOUS

## 2022-09-23 MED ORDER — SODIUM CHLORIDE 0.9 % IV BOLUS
500.0000 mL | Freq: Once | INTRAVENOUS | Status: AC
  Administered 2022-09-23: 500 mL via INTRAVENOUS

## 2022-09-23 MED ORDER — HEPARIN SODIUM (PORCINE) 1000 UNIT/ML IJ SOLN
INTRAMUSCULAR | Status: DC | PRN
  Administered 2022-09-23: 3000 [IU] via INTRAVENOUS

## 2022-09-23 MED ORDER — ENOXAPARIN SODIUM 40 MG/0.4ML IJ SOSY
40.0000 mg | PREFILLED_SYRINGE | INTRAMUSCULAR | Status: DC
  Administered 2022-09-24: 40 mg via SUBCUTANEOUS
  Filled 2022-09-23: qty 0.4

## 2022-09-23 MED ORDER — DOXYCYCLINE HYCLATE 100 MG PO TABS
100.0000 mg | ORAL_TABLET | Freq: Two times a day (BID) | ORAL | Status: DC
  Administered 2022-09-23 – 2022-09-24 (×3): 100 mg via ORAL
  Filled 2022-09-23 (×2): qty 1

## 2022-09-23 MED ORDER — HEPARIN BOLUS VIA INFUSION
3600.0000 [IU] | Freq: Once | INTRAVENOUS | Status: AC
  Administered 2022-09-23: 3600 [IU] via INTRAVENOUS
  Filled 2022-09-23: qty 3600

## 2022-09-23 MED ORDER — NICOTINE 21 MG/24HR TD PT24
21.0000 mg | MEDICATED_PATCH | Freq: Every day | TRANSDERMAL | Status: DC
  Administered 2022-09-23 – 2022-09-24 (×2): 21 mg via TRANSDERMAL
  Filled 2022-09-23 (×2): qty 1

## 2022-09-23 MED ORDER — VERAPAMIL HCL 2.5 MG/ML IV SOLN
INTRAVENOUS | Status: DC | PRN
  Administered 2022-09-23: 2.5 mg via INTRA_ARTERIAL

## 2022-09-23 MED ORDER — VERAPAMIL HCL 2.5 MG/ML IV SOLN
INTRAVENOUS | Status: AC
  Filled 2022-09-23: qty 2

## 2022-09-23 MED ORDER — HYDRALAZINE HCL 20 MG/ML IJ SOLN: 5.0000 mg | INTRAMUSCULAR | Status: DC | PRN

## 2022-09-23 MED ORDER — HEPARIN (PORCINE) 25000 UT/250ML-% IV SOLN
700.0000 [IU]/h | INTRAVENOUS | Status: DC
  Administered 2022-09-23: 700 [IU]/h via INTRAVENOUS
  Filled 2022-09-23: qty 250

## 2022-09-23 MED ORDER — ONDANSETRON HCL 4 MG/2ML IJ SOLN: 4.0000 mg | Freq: Once | INTRAMUSCULAR | Status: AC

## 2022-09-23 MED ORDER — LISINOPRIL 10 MG PO TABS: 20.0000 mg | ORAL_TABLET | Freq: Every day | ORAL | Status: DC

## 2022-09-23 MED ORDER — POTASSIUM CHLORIDE CRYS ER 20 MEQ PO TBCR
20.0000 meq | EXTENDED_RELEASE_TABLET | Freq: Two times a day (BID) | ORAL | Status: AC
  Administered 2022-09-23 (×2): 20 meq via ORAL
  Filled 2022-09-23: qty 1

## 2022-09-23 MED ORDER — SODIUM CHLORIDE 0.9% FLUSH
3.0000 mL | Freq: Two times a day (BID) | INTRAVENOUS | Status: DC
  Administered 2022-09-23: 3 mL via INTRAVENOUS

## 2022-09-23 MED ORDER — ASPIRIN 81 MG PO CHEW: 81.0000 mg | CHEWABLE_TABLET | ORAL | Status: DC

## 2022-09-23 MED ORDER — ASPIRIN 325 MG PO TABS
325.0000 mg | ORAL_TABLET | Freq: Every day | ORAL | Status: DC
  Administered 2022-09-23: 325 mg via ORAL
  Filled 2022-09-23: qty 1

## 2022-09-23 MED ORDER — SODIUM CHLORIDE 0.9 % IV SOLN: 250.0000 mL | INTRAVENOUS | Status: DC | PRN

## 2022-09-23 MED ORDER — EPINEPHRINE 0.3 MG/0.3ML IJ SOAJ: 0.3000 mg | INTRAMUSCULAR | Status: DC | PRN

## 2022-09-23 MED ORDER — DM-GUAIFENESIN ER 30-600 MG PO TB12: 1.0000 | ORAL_TABLET | Freq: Two times a day (BID) | ORAL | Status: DC | PRN

## 2022-09-23 MED ORDER — METOPROLOL SUCCINATE ER 25 MG PO TB24
12.5000 mg | ORAL_TABLET | Freq: Every day | ORAL | Status: DC
  Administered 2022-09-23: 12.5 mg via ORAL
  Filled 2022-09-23: qty 1

## 2022-09-23 MED ORDER — FLUTICASONE PROPIONATE 50 MCG/ACT NA SUSP: 2.0000 | Freq: Every day | NASAL | Status: DC | PRN

## 2022-09-23 MED ORDER — METOPROLOL TARTRATE 25 MG PO TABS
25.0000 mg | ORAL_TABLET | Freq: Two times a day (BID) | ORAL | Status: DC
  Administered 2022-09-23: 25 mg via ORAL
  Filled 2022-09-23: qty 1

## 2022-09-23 MED ORDER — SODIUM CHLORIDE 0.9 % IV SOLN: INTRAVENOUS | Status: DC

## 2022-09-23 MED ORDER — NITROGLYCERIN 0.4 MG SL SUBL
0.4000 mg | SUBLINGUAL_TABLET | SUBLINGUAL | Status: AC | PRN
  Administered 2022-09-23 (×3): 0.4 mg via SUBLINGUAL
  Filled 2022-09-23: qty 1

## 2022-09-23 MED ORDER — SODIUM CHLORIDE 0.9% FLUSH
3.0000 mL | Freq: Two times a day (BID) | INTRAVENOUS | Status: DC
  Administered 2022-09-23 – 2022-09-24 (×3): 3 mL via INTRAVENOUS

## 2022-09-23 MED ORDER — PANTOPRAZOLE SODIUM 40 MG PO TBEC
40.0000 mg | DELAYED_RELEASE_TABLET | Freq: Every day | ORAL | Status: DC
  Administered 2022-09-23 – 2022-09-24 (×2): 40 mg via ORAL
  Filled 2022-09-23 (×2): qty 1

## 2022-09-23 MED ORDER — SODIUM CHLORIDE 0.9 % WEIGHT BASED INFUSION
1.0000 mL/kg/h | INTRAVENOUS | Status: DC
  Administered 2022-09-23: 1 mL/kg/h via INTRAVENOUS

## 2022-09-23 MED ORDER — FENTANYL CITRATE PF 50 MCG/ML IJ SOSY: 25.0000 ug | PREFILLED_SYRINGE | INTRAMUSCULAR | Status: DC | PRN

## 2022-09-23 MED ORDER — MIDAZOLAM HCL 2 MG/2ML IJ SOLN
INTRAMUSCULAR | Status: AC
  Filled 2022-09-23: qty 2

## 2022-09-23 MED ORDER — ALBUTEROL SULFATE (2.5 MG/3ML) 0.083% IN NEBU: 3.0000 mL | INHALATION_SOLUTION | RESPIRATORY_TRACT | Status: DC | PRN

## 2022-09-23 MED ORDER — DOXYCYCLINE HYCLATE 100 MG PO TABS
100.0000 mg | ORAL_TABLET | Freq: Once | ORAL | Status: DC
  Filled 2022-09-23: qty 1

## 2022-09-23 MED ORDER — HEPARIN SODIUM (PORCINE) 1000 UNIT/ML IJ SOLN
INTRAMUSCULAR | Status: AC
  Filled 2022-09-23: qty 10

## 2022-09-23 MED ORDER — FENTANYL CITRATE (PF) 100 MCG/2ML IJ SOLN
INTRAMUSCULAR | Status: AC
  Filled 2022-09-23: qty 2

## 2022-09-23 MED ORDER — SODIUM CHLORIDE 0.9 % WEIGHT BASED INFUSION
3.0000 mL/kg/h | INTRAVENOUS | Status: DC
  Administered 2022-09-23: 3 mL/kg/h via INTRAVENOUS

## 2022-09-23 MED ORDER — LIDOCAINE HCL 1 % IJ SOLN
INTRAMUSCULAR | Status: AC
  Filled 2022-09-23: qty 20

## 2022-09-23 MED ORDER — ONDANSETRON HCL 4 MG/2ML IJ SOLN
INTRAMUSCULAR | Status: AC
  Administered 2022-09-23: 4 mg via INTRAVENOUS
  Filled 2022-09-23: qty 2

## 2022-09-23 MED ORDER — HEPARIN (PORCINE) IN NACL 1000-0.9 UT/500ML-% IV SOLN
INTRAVENOUS | Status: AC
  Filled 2022-09-23: qty 1000

## 2022-09-23 MED ORDER — HYDRALAZINE HCL 20 MG/ML IJ SOLN: 10.0000 mg | INTRAMUSCULAR | Status: AC | PRN

## 2022-09-23 MED ORDER — ACETAMINOPHEN 325 MG PO TABS
650.0000 mg | ORAL_TABLET | Freq: Four times a day (QID) | ORAL | Status: DC | PRN
  Administered 2022-09-24: 650 mg via ORAL
  Filled 2022-09-23: qty 2

## 2022-09-23 MED ORDER — LIDOCAINE HCL (PF) 1 % IJ SOLN
INTRAMUSCULAR | Status: DC | PRN
  Administered 2022-09-23: 2 mL

## 2022-09-23 MED ORDER — IOHEXOL 300 MG/ML  SOLN
INTRAMUSCULAR | Status: DC | PRN
  Administered 2022-09-23: 56 mL

## 2022-09-23 MED ORDER — PERFLUTREN LIPID MICROSPHERE
1.0000 mL | INTRAVENOUS | Status: AC | PRN
  Administered 2022-09-23: 3 mL via INTRAVENOUS

## 2022-09-23 MED ORDER — SODIUM CHLORIDE 0.9% FLUSH: 3.0000 mL | INTRAVENOUS | Status: DC | PRN

## 2022-09-23 MED ORDER — ALPRAZOLAM 0.25 MG PO TABS: 0.2500 mg | ORAL_TABLET | Freq: Three times a day (TID) | ORAL | Status: DC | PRN

## 2022-09-23 MED ORDER — MIRTAZAPINE 15 MG PO TABS
15.0000 mg | ORAL_TABLET | Freq: Every day | ORAL | Status: DC
  Administered 2022-09-23: 15 mg via ORAL
  Filled 2022-09-23: qty 1

## 2022-09-23 MED ORDER — HEPARIN (PORCINE) IN NACL 1000-0.9 UT/500ML-% IV SOLN
INTRAVENOUS | Status: DC | PRN
  Administered 2022-09-23: 1000 mL

## 2022-09-23 MED ORDER — FLUOXETINE HCL 20 MG PO CAPS
40.0000 mg | ORAL_CAPSULE | Freq: Every day | ORAL | Status: DC
  Administered 2022-09-23 – 2022-09-24 (×2): 40 mg via ORAL
  Filled 2022-09-23 (×2): qty 2

## 2022-09-23 MED ORDER — ONDANSETRON HCL 4 MG/2ML IJ SOLN: 4.0000 mg | Freq: Three times a day (TID) | INTRAMUSCULAR | Status: DC | PRN

## 2022-09-23 SURGICAL SUPPLY — 11 items
BAND ZEPHYR COMPRESS 30 LONG (HEMOSTASIS) IMPLANT
CATH 5F 110X4 TIG (CATHETERS) IMPLANT
CATH INFINITI 5FR ANG PIGTAIL (CATHETERS) IMPLANT
DRAPE BRACHIAL (DRAPES) IMPLANT
GLIDESHEATH SLEND SS 6F .021 (SHEATH) IMPLANT
GUIDEWIRE INQWIRE 1.5J.035X260 (WIRE) IMPLANT
INQWIRE 1.5J .035X260CM (WIRE) ×1
PACK CARDIAC CATH (CUSTOM PROCEDURE TRAY) ×1 IMPLANT
PROTECTION STATION PRESSURIZED (MISCELLANEOUS) ×1
SET ATX SIMPLICITY (MISCELLANEOUS) IMPLANT
STATION PROTECTION PRESSURIZED (MISCELLANEOUS) IMPLANT

## 2022-09-23 NOTE — ED Notes (Signed)
Critical Lab. Lactate of 2.0. MD made aware.

## 2022-09-23 NOTE — Progress Notes (Signed)
*  PRELIMINARY RESULTS* Echocardiogram 2D Echocardiogram has been performed.  Valerie Donovan Bryna Razavi 09/23/2022, 1:28 PM

## 2022-09-23 NOTE — Assessment & Plan Note (Addendum)
-  lipitor 80 mg daily

## 2022-09-23 NOTE — Assessment & Plan Note (Signed)
-   Did counseling about importance of quitting smoking -Nicotine patch

## 2022-09-23 NOTE — ED Provider Notes (Signed)
[facility] Provider Note    Event Date/Time   First MD Initiated Contact with Patient 09/23/22 [phone]     (approximate)   History   Chest Pain   HPI  Valerie Donovan is a 66 y.o. female to the ER for evaluation sternal chest pressure described as someone sitting on her chest associated with diaphoresis no shortness of breath.  Did have some nausea and vomiting after the pain began to worsen.  She denies any pleuritic chest pain.  No fevers no recent sick contacts.  No history of heart disease but does have history of high cholesterol as well as smoking.  Does not feel that she is wheezing no cough.     Physical Exam   Triage Vital Signs: ED Triage Vitals  Enc Vitals Group     BP 09/23/22 0837 116/64     Pulse Rate 09/23/22 0837 (!) 112     Resp 09/23/22 0837 (!) 22     Temp 09/23/22 0837 97.9 F (36.6 C)     Temp Source 09/23/22 0837 Oral     SpO2 09/23/22 0837 100 %     Weight 09/23/22 0834 134 lb (60.8 kg)     Height 09/23/22 0834 '5\' 3"'$  (1.6 m)     Head Circumference --      Peak Flow --      Pain Score 09/23/22 0834 8     Pain Loc --      Pain Edu? --      Excl. in [place]? --     Most recent vital signs: Vitals:   09/23/22 0837  BP: 116/64  Pulse: (!) 112  Resp: (!) 22  Temp: 97.9 F (36.6 C)  SpO2: 100%     Constitutional: Alert  Eyes: Conjunctivae are normal.  Head: Atraumatic. Nose: No congestion/rhinnorhea. Mouth/Throat: Mucous membranes are moist.   Neck: Painless ROM.  Cardiovascular:   Good peripheral circulation.  Mildly tachycardic. Respiratory: Normal respiratory effort.  No retractions.  Gastrointestinal: Soft and nontender.  Musculoskeletal:  no deformity Neurologic:  MAE spontaneously. No gross focal neurologic deficits are appreciated.  Skin:  Skin is warm, dry and intact. No rash noted. Psychiatric: Mood and affect are normal. Speech and behavior are normal.    ED Results / Procedures / Treatments    Labs (all labs ordered are listed, but only abnormal results are displayed) Labs Reviewed  BASIC METABOLIC PANEL - Abnormal; Notable for the following components:      Result Value   Glucose, Bld 154 (*)    All other components within normal limits  CBC - Abnormal; Notable for the following components:   WBC 25.8 (*)    All other components within normal limits  TROPONIN I (HIGH SENSITIVITY) - Abnormal; Notable for the following components:   Troponin I (High Sensitivity) 2,523 (*)    All other components within normal limits  CULTURE, BLOOD (ROUTINE X 2)  CULTURE, BLOOD (ROUTINE X 2)  EXPECTORATED SPUTUM ASSESSMENT W GRAM STAIN, RFLX TO RESP C  LACTIC ACID, PLASMA  LACTIC ACID, PLASMA  APTT  PROTIME-INR  HEPARIN LEVEL (UNFRACTIONATED)  PROCALCITONIN  URINE DRUG SCREEN, QUALITATIVE ([facility] ONLY)  HEMOGLOBIN A1C  LIPID PANEL  HIV ANTIBODY (ROUTINE TESTING W REFLEX)  STREP PNEUMONIAE URINARY ANTIGEN  TROPONIN I (HIGH SENSITIVITY)     EKG  ED ECG REPORT I, Merlyn Lot, the attending physician, personally viewed and interpreted this ECG.   Date: 09/23/2022  EKG Time: 8:36  Rate: 110  Rhythm: sinus  Axis: normal  Intervals: normal qt  ST&T Change: nonspecific st abn, no stemi    RADIOLOGY Please see ED Course for my review and interpretation.  I personally reviewed all radiographic images ordered to evaluate for the above acute complaints and reviewed radiology reports and findings.  These findings were personally discussed with the patient.  Please see medical record for radiology report.    PROCEDURES:  Critical Care performed: Yes, see critical care procedure note(s)  .Critical Care  Performed by: Merlyn Lot, MD Authorized by: Merlyn Lot, MD   Critical care provider statement:    Critical care time (minutes):  40   Critical care was necessary to treat or prevent imminent or life-threatening deterioration of the following conditions:   Cardiac failure   Critical care was time spent personally by me on the following activities:  Ordering and performing treatments and interventions, ordering and review of laboratory studies, ordering and review of radiographic studies, pulse oximetry, re-evaluation of patient's condition, review of old charts, obtaining history from patient or surrogate, examination of patient, evaluation of patient's response to treatment, discussions with primary provider, discussions with consultants and development of treatment plan with patient or surrogate    MEDICATIONS ORDERED IN ED: Medications  aspirin tablet 325 mg (has no administration in time range)  nitroGLYCERIN (NITROSTAT) SL tablet 0.4 mg (has no administration in time range)  sodium chloride 0.9 % bolus 500 mL (has no administration in time range)  heparin bolus via infusion 3,600 Units (has no administration in time range)  heparin ADULT infusion 100 units/mL (25000 units/220m) (has no administration in time range)  fentaNYL (SUBLIMAZE) injection 25 mcg (has no administration in time range)  ondansetron (ZOFRAN) injection 4 mg (has no administration in time range)  hydrALAZINE (APRESOLINE) injection 5 mg (has no administration in time range)  acetaminophen (TYLENOL) tablet 650 mg (has no administration in time range)  nicotine (NICODERM CQ - dosed in mg/24 hours) patch 21 mg (has no administration in time range)  albuterol (PROVENTIL) (2.5 MG/3ML) 0.083% nebulizer solution 3 mL (has no administration in time range)  dextromethorphan-guaiFENesin (MUCINEX DM) 30-600 MG per 12 hr tablet 1 tablet (has no administration in time range)     IMPRESSION / MDM / ASSESSMENT AND PLAN / ED COURSE  I reviewed the triage vital signs and the nursing notes.                              Differential diagnosis includes, but is not limited to, ACS, pericarditis, esophagitis, boerhaaves, pe, dissection, pna, bronchitis, costochondritis  Patient  presenting to the ER for evaluation of symptoms as described above.  Based on symptoms, risk factors and considered above differential, this presenting complaint could reflect a potentially life-threatening illness therefore the patient will be placed on continuous pulse oximetry and telemetry for monitoring.  Laboratory evaluation will be sent to evaluate for the above complaints.  EKG without evidence of acute ischemia troponin significantly elevated 2500 but also has acute leukocytosis no hypoxia.  No fever.  Presentation concerning for NSTEMI.  Lower suspicion for PE or dissection.  Chest x-ray my review and interpretation does show evidence of possible developing infiltrate no pneumothorax.   Clinical Course as of 09/23/22 1012  Thu Sep 23, 2022  0957 Given patient's presentation will heparinize patient given aspirin.  Will give IV fluids.  We will also send blood work for evaluation of  possible sepsis and pneumonia.  Patient has extensive anaphylactic reactions to antibiotics only 1 that she is been able to tolerate his doxycycline.  Will give p.o. Doxy after obtaining cultures.  Will consult hospitalist for admission.  I have consulted cardiology regarding her presentation and concern for NSTEMI.  Agree with plan and they will evaluate patient at bedside for further management. [PR]    Clinical Course User Index [PR] Merlyn Lot, MD   FINAL CLINICAL IMPRESSION(S) / ED DIAGNOSES   Final diagnoses:  Chest pain, unspecified type  Elevated troponin     Rx / DC Orders   ED Discharge Orders     None        Note:  This document was prepared using Dragon voice recognition software and may include unintentional dictation errors.    Merlyn Lot, MD 09/23/22 1012

## 2022-09-23 NOTE — Consult Note (Signed)
ANTICOAGULATION CONSULT NOTE - Initial Consult  Pharmacy Consult for heparin Indication: chest pain/ACS  Allergies  Allergen Reactions   Ciprofloxacin Anaphylaxis   Clarithromycin Anaphylaxis   Other Anaphylaxis    ANTIBIOTICS-PT CAN TOLERATE Z-PAC AND DOXYCYCLINE BUT ALL OTHERS SHE CANNOT   Penicillin G Anaphylaxis    Has patient had a PCN reaction causing immediate rash, facial/tongue/throat swelling, SOB or lightheadedness with hypotension: Yes Has patient had a PCN reaction causing severe rash involving mucus membranes or skin necrosis: No Has patient had a PCN reaction that required hospitalization: No Has patient had a PCN reaction occurring within the last 10 years: No If all of the above answers are "NO", then may proceed with Cephalosporin use.    Codeine Itching    vomiting   Fluoride Preparations Hives   Trazodone Itching    Patient Measurements: Height: '5\' 3"'$  (160 cm) Weight: 60.8 kg (134 lb) IBW/kg (Calculated) : 52.4 Heparin Dosing Weight: 60.8 kg  Vital Signs: Temp: 97.9 F (36.6 C) (10/19 0837) Temp Source: Oral (10/19 0837) BP: 116/64 (10/19 0837) Pulse Rate: 112 (10/19 0837)  Labs: Recent Labs    09/23/22 0837  HGB 13.7  HCT 42.4  PLT 365  CREATININE 0.99  TROPONINIHS 2,523*    Estimated Creatinine Clearance: 45 mL/min (by C-G formula based on SCr of 0.99 mg/dL).   Medical History: Past Medical History:  Diagnosis Date   Allergy    Anxiety    Arthritis    FINGERS   Cataract    COPD (chronic obstructive pulmonary disease) (HCC)    Depression    GERD (gastroesophageal reflux disease)    Hyperlipidemia    Hypertension    NSTEMI (non-ST elevated myocardial infarction) ([place]) 09/23/2022   Postmenopausal bleeding 2014; 10/202018; 05/14/18   endometrial bx done 09/2015 - nl; nl paps    Medications:  No prior chronic anticoagulation PTA per chart review  Assessment: 66 yo F with PMH HTN, COPD, depression, tobacco use presents with chest  pressure that radiates to arm, started around 0400 this AM. Also endorses nausea and upset stomach with pain. Pt reports doing a lot of yard work yesterday. Troponins high at 2,523. Hgb, Hct, and PLT stable.  Baseline Labs: aPTT - ordered; INR - ordered Hgb - 13.7; Plts - 365  Date Time aPTT/HL Rate/Comment  Goal of Therapy:  Heparin level 0.3-0.7 units/ml Monitor platelets by anticoagulation protocol: Yes   Plan:  Give 3,600 units bolus x1; then start heparin infusion at 700 units/hr Check anti-Xa level in 6 hours and daily once consecutively therapeutic. Continue to monitor H&H and platelets daily while on heparin gtt.  Dara Hoyer, PharmD PGY-1 Pharmacy Resident 09/23/2022 10:10 AM

## 2022-09-23 NOTE — Assessment & Plan Note (Signed)
-  see above 

## 2022-09-23 NOTE — ED Notes (Signed)
Nitro 0.'4mg'$  SL given for 8/10 substernal CP

## 2022-09-23 NOTE — Interval H&P Note (Signed)
History and Physical Interval Note:  09/23/2022 3:31 PM  Valerie Donovan  has presented today for surgery, with the diagnosis of NSTEMI.  The various methods of treatment have been discussed with the patient and family. After consideration of risks, benefits and other options for treatment, the patient has consented to  Procedure(s): LEFT HEART CATH AND CORONARY ANGIOGRAPHY (N/A) as a surgical intervention.  The patient's history has been reviewed, patient examined, no change in status, stable for surgery.  I have reviewed the patient's chart and labs.  Questions were answered to the patient's satisfaction.    Cath Lab Visit (complete for each Cath Lab visit)  Clinical Evaluation Leading to the Procedure:   ACS: Yes.    Non-ACS:  N/A  Mignon Bechler

## 2022-09-23 NOTE — ED Triage Notes (Signed)
Patient c/o chest pressure that radiates to left arm. Started around 4am. Also reports Nausea and upset stomach with pain. Reports doing a lot of yard work yesterday

## 2022-09-23 NOTE — Assessment & Plan Note (Addendum)
-   IV hydralazine as needed -Lisinopril is on hold due to going to cardic cath -Added metoprolol 25 mg twice daily per Dr. Rockey Situ

## 2022-09-23 NOTE — Assessment & Plan Note (Signed)
Trop 2523.  Consulted Dr. Rockey Situ of cardiology, who is planning to do cardiac catheter today. - admit to tele bed as inpatient - IV heparin  - Trend Trop - prn Nitroglycerin, fentanyl - aspirin, lipitor  - Risk factor stratification: will check FLP and A1C  - check UDS

## 2022-09-23 NOTE — H&P (Signed)
History and Physical    Valerie Donovan JJH:[id number] DOB: February 26, 1956 DOA: 09/23/2022  Referring MD/NP/PA:   PCP: Jon Billings, NP   Patient coming from:  The patient is coming from home.     Chief Complaint: chest pain  HPI: Valerie Donovan is a 66 y.o. female with medical history significant of hypertension, hyperlipidemia, COPD, GERD, allergy to multiple medications, depression with anxiety, tobacco abuse, who presents with chest pain.  Patient states that her chest pain started in the early morning at about 4 AM, which is located in the substernal area, pressure-like, 7 out of 10 in severity, radiating to the left arm, associated with shortness breath and diaphoresis.  Patient does not have cough, fever or chills.  She states that she has nausea and vomited twice, and had 1 episode of loose stool bowel movement.  Currently no abdominal pain or active diarrhea.  Denies symptoms of UTI.  Patient does not have rectal bleeding or dark stool.  No active bleeding from anywhere.  Denies recent fall or head injury.  Patient is anxious and tearful during the interview.  Data reviewed independently and ED Course: pt was found to have WBC 25.8, troponin 2523, GFR> 60, temperature normal, blood pressure 116/64, heart rate 112, RR 22, oxygen saturation 100% on room air.  Chest x-ray showed bilateral basilar opacity.  Patient is admitted to telemetry bed as inpatient.  Dr. Rockey Situ of card is consulted.  EKG: I have personally reviewed.  Sinus rhythm, QTc 433, low voltage, seems to have T wave inversion in lead III/aVF   Review of Systems:   General: no fevers, chills, no body weight gain, fatigue HEENT: no blurry vision, hearing changes or sore throat Respiratory: has dyspnea, no coughing, wheezing CV: has chest pain, no palpitations GI: has nausea, vomiting, no abdominal pain, diarrhea, constipation GU: no dysuria, burning on urination, increased urinary frequency, hematuria  Ext: no  leg edema Neuro: no unilateral weakness, numbness, or tingling, no vision change or hearing loss Skin: no rash, no skin tear. MSK: No muscle spasm, no deformity, no limitation of range of movement in spin Heme: No easy bruising.  Travel history: No recent long distant travel.   Allergy:  Allergies  Allergen Reactions   Ciprofloxacin Anaphylaxis   Clarithromycin Anaphylaxis   Other Anaphylaxis    ANTIBIOTICS-PT CAN TOLERATE Z-PAC AND DOXYCYCLINE BUT ALL OTHERS SHE CANNOT   Penicillin G Anaphylaxis    Has patient had a PCN reaction causing immediate rash, facial/tongue/throat swelling, SOB or lightheadedness with hypotension: Yes Has patient had a PCN reaction causing severe rash involving mucus membranes or skin necrosis: No Has patient had a PCN reaction that required hospitalization: No Has patient had a PCN reaction occurring within the last 10 years: No If all of the above answers are "NO", then may proceed with Cephalosporin use.    Codeine Itching    vomiting   Fluoride Preparations Hives   Trazodone Itching    Past Medical History:  Diagnosis Date   Allergy    Anxiety    Arthritis    FINGERS   Cataract    COPD (chronic obstructive pulmonary disease) (HCC)    Depression    GERD (gastroesophageal reflux disease)    Hyperlipidemia    Hypertension    NSTEMI (non-ST elevated myocardial infarction) ([place]) 09/23/2022   Postmenopausal bleeding 2014; 10/202018; 05/14/18   endometrial bx done 09/2015 - nl; nl paps    Past Surgical History:  Procedure Laterality Date  COLONOSCOPY WITH PROPOFOL N/A 07/20/2019   Procedure: COLONOSCOPY WITH PROPOFOL;  Surgeon: Lucilla Lame, MD;  Location: [facility];  Service: Endoscopy;  Laterality: N/A;   HYSTEROSCOPY WITH D & C N/A 06/15/2018   Procedure: DILATATION AND CURETTAGE /HYSTEROSCOPY;  Surgeon: Homero Fellers, MD;  Location: [facility];  Service: Gynecology;  Laterality: N/A;   POLYPECTOMY  07/20/2019   Procedure:  POLYPECTOMY;  Surgeon: Lucilla Lame, MD;  Location: [facility];  Service: Endoscopy;;   TUBAL LIGATION     WISDOM TOOTH EXTRACTION      Social History:  reports that she has been smoking cigarettes. She has a 22.00 pack-year smoking history. She has never used smokeless tobacco. She reports that she does not drink alcohol and does not use drugs.  Family History:  Family History  Problem Relation Age of Onset   Cancer Mother    Diabetes Mother    Early death Mother    Cancer Father        colon   Arthritis Father    Diabetes Sister    Diabetes Sister    Diabetes Maternal Grandmother    Diabetes Maternal Grandfather    Breast cancer Neg Hx      Prior to Admission medications   Medication Sig Start Date End Date Taking? Authorizing Provider  atorvastatin (LIPITOR) 20 MG tablet TAKE 1 TABLET(20 MG) BY MOUTH EVERY MORNING 07/07/22   Jon Billings, NP  EPINEPHrine (EPIPEN 2-PAK) 0.3 mg/0.3 mL IJ SOAJ injection Inject 0.3 mg into the muscle as needed for anaphylaxis. 03/10/22   Jon Billings, NP  FLUoxetine (PROZAC) 40 MG capsule TAKE 1 CAPSULE BY MOUTH DAILY 07/07/22   Jon Billings, NP  fluticasone [facility]) 50 MCG/ACT nasal spray Place 2 sprays into both nostrils daily. 04/12/21   Sharion Balloon, FNP  gabapentin (NEURONTIN) 100 MG capsule Take 1 capsule (100 mg total) by mouth 3 (three) times daily. 02/11/22   Jon Billings, NP  lisinopril (ZESTRIL) 20 MG tablet TAKE 1 TABLET(20 MG) BY MOUTH DAILY 04/12/22   Jon Billings, NP  Melatonin 10 MG TABS Take 10 mg by mouth at bedtime. Patient not taking: Reported on 08/19/2022    [provider]  mirtazapine (REMERON) 15 MG tablet Take 1 tablet (15 mg total) by mouth at bedtime. 04/15/22   Jon Billings, NP  omeprazole (PRILOSEC) 20 MG capsule Take 1 capsule (20 mg total) by mouth daily as needed (for acid reflux/indigestion.). 07/21/20   Volney American, PA-C    Physical Exam: Vitals:   09/23/22 0834  09/23/22 0837  BP:  116/64  Pulse:  (!) 112  Resp:  (!) 22  Temp:  97.9 F (36.6 C)  TempSrc:  Oral  SpO2:  100%  Weight: 60.8 kg   Height: '5\' 3"'$  (1.6 m)    General: Not in acute distress HEENT:       Eyes: PERRL, EOMI, no scleral icterus.       ENT: No discharge from the ears and nose, no pharynx injection, no tonsillar enlargement.        Neck: No JVD, no bruit, no mass felt. Heme: No neck lymph node enlargement. Cardiac: S1/S2, RRR, No murmurs, No gallops or rubs. Respiratory: No rales, wheezing, rhonchi or rubs. GI: Soft, nondistended, nontender, no rebound pain, no organomegaly, BS present. GU: No hematuria Ext: No pitting leg edema bilaterally. 1+DP/PT pulse bilaterally. Musculoskeletal: No joint deformities, No joint redness or warmth, no limitation of ROM in spin. Skin: No rashes.  Neuro: Alert, oriented X3, cranial nerves II-XII grossly intact, moves all extremities normally.  Psych: Patient is not psychotic, no suicidal or hemocidal ideation.  Labs on Admission: I have personally reviewed following labs and imaging studies  CBC: Recent Labs  Lab 09/23/22 0837  WBC 25.8*  HGB 13.7  HCT 42.4  MCV 95.9  PLT 734   Basic Metabolic Panel: Recent Labs  Lab 09/23/22 0837  NA 141  K 3.5  CL 108  CO2 23  GLUCOSE 154*  BUN 14  CREATININE 0.99  CALCIUM 9.5   GFR: Estimated Creatinine Clearance: 45 mL/min (by C-G formula based on SCr of 0.99 mg/dL). Liver Function Tests: No results for input(s): "AST", "ALT", "ALKPHOS", "BILITOT", "PROT", "ALBUMIN" in the last 168 hours. No results for input(s): "LIPASE", "AMYLASE" in the last 168 hours. No results for input(s): "AMMONIA" in the last 168 hours. Coagulation Profile: Recent Labs  Lab 09/23/22 1017  INR 1.0   Cardiac Enzymes: No results for input(s): "CKTOTAL", "CKMB", "CKMBINDEX", "TROPONINI" in the last 168 hours. BNP (last 3 results) No results for input(s): "PROBNP" in the last 8760 hours. HbA1C: No  results for input(s): "HGBA1C" in the last 72 hours. CBG: No results for input(s): "GLUCAP" in the last 168 hours. Lipid Profile: Recent Labs    09/23/22 1017  CHOL 164  HDL 47  LDLCALC 97  TRIG 100  CHOLHDL 3.5   Thyroid Function Tests: No results for input(s): "TSH", "T4TOTAL", "FREET4", "T3FREE", "THYROIDAB" in the last 72 hours. Anemia Panel: No results for input(s): "VITAMINB12", "FOLATE", "FERRITIN", "TIBC", "IRON", "RETICCTPCT" in the last 72 hours. Urine analysis:    Component Value Date/Time   COLORURINE YELLOW (A) 05/08/2021 1329   APPEARANCEUR Clear 08/19/2022 0929   LABSPEC 1.008 05/08/2021 1329   PHURINE 9.0 (H) 05/08/2021 1329   GLUCOSEU Negative 08/19/2022 0929   HGBUR SMALL (A) 05/08/2021 1329   BILIRUBINUR Negative 08/19/2022 0929   KETONESUR NEGATIVE 05/08/2021 1329   PROTEINUR Negative 08/19/2022 0929   PROTEINUR NEGATIVE 05/08/2021 1329   NITRITE Negative 08/19/2022 0929   NITRITE NEGATIVE 05/08/2021 1329   LEUKOCYTESUR 1+ (A) 08/19/2022 0929   LEUKOCYTESUR NEGATIVE 05/08/2021 1329   Sepsis Labs: '@LABRCNTIP'$ (procalcitonin:4,lacticidven:4) )No results found for this or any previous visit (from the past 240 hour(s)).   Radiological Exams on Admission: DG Chest 2 View  Result Date: 09/23/2022 CLINICAL DATA:  Chest pain EXAM: CHEST - 2 VIEW COMPARISON:  Chest x-ray dated May 08, 2021 FINDINGS: The heart size and mediastinal contours are within normal limits. New mild bibasilar opacities. The visualized skeletal structures are unremarkable. IMPRESSION: New mild bibasilar opacities, likely atelectasis, although infection or aspiration could appear similar. Recommend follow-up PA and lateral chest x-ray in 6-8 weeks to ensure resolution. Electronically Signed   By: Yetta Glassman M.D.   On: 09/23/2022 09:31      Assessment/Plan Principal Problem:   NSTEMI (non-ST elevated myocardial infarction) ([place]) Active Problems:   HLD (hyperlipidemia)    Hypertension   Depression with anxiety   CAP (community acquired pneumonia)   Sepsis ([place])   COPD (chronic obstructive pulmonary disease) (HCC)   Tobacco abuse   Assessment and Plan: * NSTEMI (non-ST elevated myocardial infarction) ([place]) Trop 2523.  Consulted Dr. Rockey Situ of cardiology, who is planning to do cardiac catheter today. - admit to tele bed as inpatient - IV heparin  - Trend Trop - prn Nitroglycerin, fentanyl - aspirin, lipitor  - Risk factor stratification: will check FLP and A1C  - check  UDS    HLD (hyperlipidemia) -lipitor 80 mg daily  Hypertension - IV hydralazine as needed -Lisinopril is on hold due to going to cardic cath -Added metoprolol 25 mg twice daily per Dr. Rockey Situ  Depression with anxiety - Continue home Prozac and Remeron -Add as needed Xanax for anxiety  CAP (community acquired pneumonia) Sepsis due to possible CAP: Patient has significant leukocytosis with WBC 25.8, but no fever.  Chest x-ray showed bilateral basilar opacity.  Another possibility is aspiration due to nausea vomiting. Patient meets criteria for sepsi with WBC 25.8, heart rate 112, RR 22.  Patient is allergic to multiple medications and antibiotics.  -Start doxycycline empirically -As needed albuterol and Mucinex -Blood culture, sputum culture -Urine antigen of streptococcal -IV fluid: 500 cc normal saline, then 75 cc/h --> will give more NS bolus if lactic acid level is increased -Trend lactic acid level -Check procalcitonin level  Sepsis (HCC) -see above  COPD (chronic obstructive pulmonary disease) (HCC) No wheezing -As needed albuterol and Mucinex  Tobacco abuse - Did counseling about importance of quitting smoking -Nicotine patch        DVT ppx: on IV Heparin       Code Status: Full code  Family Communication: Yes, patient's sister  by phone  Disposition Plan:  Anticipate discharge back to previous environment  Consults called:  Dr. Rockey Situ of  cardiology  Admission status and Level of care: Progressive:     as inpt       Dispo: The patient is from: Home              Anticipated d/c is to: Home              Anticipated d/c date is: 2 days              Patient currently is not medically stable to d/c.    Severity of Illness:  The appropriate patient status for this patient is INPATIENT. Inpatient status is judged to be reasonable and necessary in order to provide the required intensity of service to ensure the patient's safety. The patient's presenting symptoms, physical exam findings, and initial radiographic and laboratory data in the context of their chronic comorbidities is felt to place them at high risk for further clinical deterioration. Furthermore, it is not anticipated that the patient will be medically stable for discharge from the hospital within 2 midnights of admission.   * I certify that at the point of admission it is my clinical judgment that the patient will require inpatient hospital care spanning beyond 2 midnights from the point of admission due to high intensity of service, high risk for further deterioration and high frequency of surveillance required.*       Date of Service 09/23/2022    Ivor Costa [facility]   If 7PM-7AM, please contact night-coverage [email or web address] 09/23/2022, 11:29 AM

## 2022-09-23 NOTE — Assessment & Plan Note (Addendum)
Sepsis due to possible CAP: Patient has significant leukocytosis with WBC 25.8, but no fever.  Chest x-ray showed bilateral basilar opacity.  Another possibility is aspiration due to nausea vomiting. Patient meets criteria for sepsi with WBC 25.8, heart rate 112, RR 22.  Patient is allergic to multiple medications and antibiotics.  -Start doxycycline empirically -As needed albuterol and Mucinex -Blood culture, sputum culture -Urine antigen of streptococcal -IV fluid: 500 cc normal saline, then 75 cc/h --> will give more NS bolus if lactic acid level is increased -Trend lactic acid level -Check procalcitonin level

## 2022-09-23 NOTE — ED Notes (Signed)
Pt had X 2 episodes of vomiting. Stated that substernal CP down to 3/10

## 2022-09-23 NOTE — H&P (View-Only) (Signed)
Cardiology Consultation   Patient ID: Valerie Donovan MRN: [id number]; DOB: 1955/12/18  Admit date: 09/23/2022 Date of Consult: 09/23/2022  PCP:  Jon Billings, NP   [facility] Providers Cardiologist:  new to [facility] Physician requesting consult: Dr. Merlyn Lot:  Reason for consult: Non-STEMI   {  Patient Profile:   Valerie Donovan is a 66 y.o. female with a hx of smoking 1 ppd, hyperlipidemia, hypertension who presents with substernal chest pressure concerning for angina, non-STEMI  History of Present Illness:   Ms. Dudas reports that she was in her usual state of health, had busy day working around her house yesterday cleaning gutters, moving heavy plants, had some mild shortness of breath during activity, overnight 4 AM with acute substernal chest pressure radiating up to her jaw.  Reports discomfort was severe and she had nausea vomiting.  Given symptoms persisted, she presented to the emergency room.  Denies any fever, no cough, sputum Emergency room initial troponin 2500. Still has discomfort, has not received nitro yet Heparin infusion has not been initiated yet  Initial EKG showing sinus tachycardia without dramatic ST abnormality  Past Medical History:  Diagnosis Date   Allergy    Anxiety    Arthritis    FINGERS   Cataract    COPD (chronic obstructive pulmonary disease) (HCC)    Depression    GERD (gastroesophageal reflux disease)    Hyperlipidemia    Hypertension    NSTEMI (non-ST elevated myocardial infarction) ([place]) 09/23/2022   Postmenopausal bleeding 2014; 10/202018; 05/14/18   endometrial bx done 09/2015 - nl; nl paps    Past Surgical History:  Procedure Laterality Date   COLONOSCOPY WITH PROPOFOL N/A 07/20/2019   Procedure: COLONOSCOPY WITH PROPOFOL;  Surgeon: Lucilla Lame, MD;  Location: [facility];  Service: Endoscopy;  Laterality: N/A;   HYSTEROSCOPY WITH D & C N/A 06/15/2018   Procedure: DILATATION AND CURETTAGE  /HYSTEROSCOPY;  Surgeon: Homero Fellers, MD;  Location: [facility];  Service: Gynecology;  Laterality: N/A;   POLYPECTOMY  07/20/2019   Procedure: POLYPECTOMY;  Surgeon: Lucilla Lame, MD;  Location: [facility];  Service: Endoscopy;;   TUBAL LIGATION     WISDOM TOOTH EXTRACTION       Home Medications:  Prior to Admission medications   Medication Sig Start Date End Date Taking? Authorizing Provider  atorvastatin (LIPITOR) 20 MG tablet TAKE 1 TABLET(20 MG) BY MOUTH EVERY MORNING 07/07/22   Jon Billings, NP  EPINEPHrine (EPIPEN 2-PAK) 0.3 mg/0.3 mL IJ SOAJ injection Inject 0.3 mg into the muscle as needed for anaphylaxis. 03/10/22   Jon Billings, NP  FLUoxetine (PROZAC) 40 MG capsule TAKE 1 CAPSULE BY MOUTH DAILY 07/07/22   Jon Billings, NP  fluticasone [facility]) 50 MCG/ACT nasal spray Place 2 sprays into both nostrils daily. 04/12/21   Sharion Balloon, FNP  gabapentin (NEURONTIN) 100 MG capsule Take 1 capsule (100 mg total) by mouth 3 (three) times daily. 02/11/22   Jon Billings, NP  lisinopril (ZESTRIL) 20 MG tablet TAKE 1 TABLET(20 MG) BY MOUTH DAILY 04/12/22   Jon Billings, NP  Melatonin 10 MG TABS Take 10 mg by mouth at bedtime. Patient not taking: Reported on 08/19/2022    [provider]  mirtazapine (REMERON) 15 MG tablet Take 1 tablet (15 mg total) by mouth at bedtime. 04/15/22   Jon Billings, NP  omeprazole (PRILOSEC) 20 MG capsule Take 1 capsule (20 mg total) by mouth daily as needed (for acid reflux/indigestion.). 07/21/20  Volney American, [place]    Inpatient Medications: Scheduled Meds:  aspirin  325 mg Oral Daily   doxycycline  100 mg Oral Q12H   heparin  3,600 Units Intravenous Once   nicotine  21 mg Transdermal Daily   sodium chloride flush  3 mL Intravenous Q12H   Continuous Infusions:  sodium chloride     heparin     PRN Meds: acetaminophen, albuterol, ALPRAZolam, dextromethorphan-guaiFENesin, fentaNYL (SUBLIMAZE)  injection, hydrALAZINE, nitroGLYCERIN, ondansetron (ZOFRAN) IV  Allergies:    Allergies  Allergen Reactions   Ciprofloxacin Anaphylaxis   Clarithromycin Anaphylaxis   Other Anaphylaxis    ANTIBIOTICS-PT CAN TOLERATE Z-PAC AND DOXYCYCLINE BUT ALL OTHERS SHE CANNOT   Penicillin G Anaphylaxis    Has patient had a PCN reaction causing immediate rash, facial/tongue/throat swelling, SOB or lightheadedness with hypotension: Yes Has patient had a PCN reaction causing severe rash involving mucus membranes or skin necrosis: No Has patient had a PCN reaction that required hospitalization: No Has patient had a PCN reaction occurring within the last 10 years: No If all of the above answers are "NO", then may proceed with Cephalosporin use.    Codeine Itching    vomiting   Fluoride Preparations Hives   Trazodone Itching    Social History:   Social History   Socioeconomic History   Marital status: Divorced    Spouse name: Not on file   Number of children: Not on file   Years of education: Not on file   Highest education level: Not on file  Occupational History   Occupation: retired  Tobacco Use   Smoking status: Every Day    Packs/day: 0.50    Years: 44.00    Total pack years: 22.00    Types: Cigarettes   Smokeless tobacco: Never  Vaping Use   Vaping Use: Former  Substance and Sexual Activity   Alcohol use: No   Drug use: No    Comment: CBD OIL IN [place]   Sexual activity: Not Currently    Birth control/protection: Post-menopausal  Other Topics Concern   Not on file  Social History Narrative   Not on file   Social Determinants of Health   Financial Resource Strain: Low Risk  (12/30/2021)   Overall Financial Resource Strain (CARDIA)    Difficulty of Paying Living Expenses: Not very hard  Food Insecurity: No Food Insecurity (12/30/2021)   Hunger Vital Sign    Worried About Running Out of Food in the Last Year: Never true    [place] in the Last Year: Never true   Transportation Needs: No Transportation Needs (12/30/2021)   PRAPARE - Hydrologist (Medical): No    Lack of Transportation (Non-Medical): No  Physical Activity: Sufficiently Active (12/30/2021)   Exercise Vital Sign    Days of Exercise per Week: 4 days    Minutes of Exercise per Session: 50 min  Stress: No Stress Concern Present (12/30/2021)   [place]    Feeling of Stress : Only a little  Social Connections: Socially Isolated (12/30/2021)   Social Connection and Isolation Panel [NHANES]    Frequency of Communication with Friends and Family: More than three times a week    Frequency of Social Gatherings with Friends and Family: More than three times a week    Attends Religious Services: Never    Marine scientist or Organizations: No    Attends Archivist  Meetings: Never    Marital Status: Divorced  Human resources officer Violence: Not At Risk (12/30/2021)   Humiliation, Afraid, Rape, and Kick questionnaire    Fear of Current or Ex-Partner: No    Emotionally Abused: No    Physically Abused: No    Sexually Abused: No    Family History:    Family History  Problem Relation Age of Onset   Cancer Mother    Diabetes Mother    Early death Mother    Cancer Father        colon   Arthritis Father    Diabetes Sister    Diabetes Sister    Diabetes Maternal Grandmother    Diabetes Maternal Grandfather    Breast cancer Neg Hx      ROS:  Please see the history of present illness.  Review of Systems  Constitutional: Negative.   HENT: Negative.    Respiratory: Negative.    Cardiovascular:  Positive for chest pain.  Gastrointestinal: Negative.   Musculoskeletal: Negative.   Neurological: Negative.   Psychiatric/Behavioral: Negative.    All other systems reviewed and are negative.   Physical Exam/Data:   Vitals:   09/23/22 0834 09/23/22 0837  BP:  116/64  Pulse:  (!) 112   Resp:  (!) 22  Temp:  97.9 F (36.6 C)  TempSrc:  Oral  SpO2:  100%  Weight: 60.8 kg   Height: '5\' 3"'$  (1.6 m)    No intake or output data in the 24 hours ending 09/23/22 1027    09/23/2022    8:34 AM 08/19/2022    9:09 AM 03/25/2022   10:34 AM  Last 3 Weights  Weight (lbs) 134 lb 133 lb 131 lb 3.2 oz  Weight (kg) 60.782 kg 60.328 kg 59.512 kg     Body mass index is 23.74 kg/m.  General:  Well nourished, well developed, in no acute distress HEENT: normal Neck: no JVD Vascular: No carotid bruits; Distal pulses 2+ bilaterally Cardiac:  normal S1, S2; RRR; no murmur  Lungs:  clear to auscultation bilaterally, no wheezing, rhonchi or rales  Abd: soft, nontender, no hepatomegaly  Ext: no edema Musculoskeletal:  No deformities, BUE and BLE strength normal and equal Skin: warm and dry  Neuro:  CNs 2-12 intact, no focal abnormalities noted Psych:  Normal affect   EKG:  The EKG was personally reviewed and demonstrates:   Normal sinus rhythm with rate 100 bpm nonspecific ST-T wave abnormality inferior leads III and aVF Telemetry:  Telemetry was personally reviewed and demonstrates: Sinus tachycardia  Relevant CV Studies: Echocardiogram pending  Laboratory Data:  High Sensitivity Troponin:   Recent Labs  Lab 09/23/22 0837  TROPONINIHS 2,523*     Chemistry Recent Labs  Lab 09/23/22 0837  NA 141  K 3.5  CL 108  CO2 23  GLUCOSE 154*  BUN 14  CREATININE 0.99  CALCIUM 9.5  GFRNONAA >60  ANIONGAP 10    No results for input(s): "PROT", "ALBUMIN", "AST", "ALT", "ALKPHOS", "BILITOT" in the last 168 hours. Lipids No results for input(s): "CHOL", "TRIG", "HDL", "LABVLDL", "LDLCALC", "CHOLHDL" in the last 168 hours.  Hematology Recent Labs  Lab 09/23/22 0837  WBC 25.8*  RBC 4.42  HGB 13.7  HCT 42.4  MCV 95.9  MCH 31.0  MCHC 32.3  RDW 12.4  PLT 365   Thyroid No results for input(s): "TSH", "FREET4" in the last 168 hours.  BNPNo results for input(s): "BNP",  "PROBNP" in the last 168 hours.  DDimer  No results for input(s): "DDIMER" in the last 168 hours.   Radiology/Studies:  DG Chest 2 View  Result Date: 09/23/2022 CLINICAL DATA:  Chest pain EXAM: CHEST - 2 VIEW COMPARISON:  Chest x-ray dated May 08, 2021 FINDINGS: The heart size and mediastinal contours are within normal limits. New mild bibasilar opacities. The visualized skeletal structures are unremarkable. IMPRESSION: New mild bibasilar opacities, likely atelectasis, although infection or aspiration could appear similar. Recommend follow-up PA and lateral chest x-ray in 6-8 weeks to ensure resolution. Electronically Signed   By: Yetta Glassman M.D.   On: 09/23/2022 09:31     Assessment and Plan:   Non-STEMI Risk factors including hyperlipidemia, long history of smoking who continues to smoke 1 pack/day.  Does not have diabetes by history -Acute onset of chest pain concerning for angina 4 AM after busy day yesterday cleaning gutters moving heavy plants -Treatment options discussed with her we have recommended cardiac catheterization -I have reviewed the risks, indications, and alternatives to cardiac catheterization, possible angioplasty, and stenting with the patient. Risks include but are not limited to bleeding, infection, vascular injury, stroke, myocardial infection, arrhythmia, kidney injury, radiation-related injury in the case of prolonged fluoroscopy use, emergency cardiac surgery, and death. The patient understands the risks of serious complication is 1-2 in 2297 with diagnostic cardiac cath and 1-2% or less with angioplasty/stenting.  -She is in agreement, currently scheduled for 130 with Dr. Saunders Revel Heparin infusion initiated, aspirin, metoprolol, statin, Echocardiogram pending  2.  Smoker Smoking cessation recommended For smoking 1 pack/day Started on nicotine patch  3.  Hyperlipidemia Number up to 80 mg daily Goal LDL less than 60  4.  Essential hypertension On lisinopril  as outpatient, this is on hold with pending catheterization later today   Total encounter time more than 80 minutes  Greater than 50% was spent in counseling and coordination of care with the patient   For questions or updates, please contact [place] Please consult [email or web address] for contact info under    Signed, Ida Rogue, MD  09/23/2022 10:27 AM

## 2022-09-23 NOTE — Brief Op Note (Signed)
BRIEF CARDIAC CATHETERIZATION NOTE  DATE: 09/23/2022  TIME: 4:04 PM  PATIENT:  Valerie Donovan  66 y.o. female  PRE-OPERATIVE DIAGNOSIS:  NSTEMI  POST-OPERATIVE DIAGNOSIS:  Takotsubo cardiomyopathy  PROCEDURE:  Procedure(s): LEFT HEART CATH AND CORONARY ANGIOGRAPHY (N/A)  SURGEON:  Surgeon(s) and Role:    * Sarahy Creedon, Harrell Gave, MD - Primary  FINDINGS: Nonobstructive CAD. Severely reduced LVEF involving mid and apical segments consistent with Takotsubo cardiomyopathy. Mildly elevated left ventricular filling pressure (LVEDP ~20 mmHg).  RECOMMENDATIONS: Gentle diuresis as BP and renal function allow. Judicious introduction of GDMT for acute HFrEF, as BP allows. Medical therapy and risk factor modification to prevent progression of nonobstructive CAD.  Nelva Bush, MD [facility]

## 2022-09-23 NOTE — Assessment & Plan Note (Addendum)
-   Continue home Prozac and Remeron -Add as needed Xanax for anxiety

## 2022-09-23 NOTE — Consult Note (Signed)
Cardiology Consultation   Patient ID: Valerie Donovan MRN: [id number]; DOB: 07-11-1956  Admit date: 09/23/2022 Date of Consult: 09/23/2022  PCP:  Jon Billings, NP   [facility] Providers Cardiologist:  new to [facility] Physician requesting consult: Dr. Merlyn Lot:  Reason for consult: Non-STEMI   {  Patient Profile:   Valerie Donovan is a 66 y.o. female with a hx of smoking 1 ppd, hyperlipidemia, hypertension who presents with substernal chest pressure concerning for angina, non-STEMI  History of Present Illness:   Valerie Donovan reports that she was in her usual state of health, had busy day working around her house yesterday cleaning gutters, moving heavy plants, had some mild shortness of breath during activity, overnight 4 AM with acute substernal chest pressure radiating up to her jaw.  Reports discomfort was severe and she had nausea vomiting.  Given symptoms persisted, she presented to the emergency room.  Denies any fever, no cough, sputum Emergency room initial troponin 2500. Still has discomfort, has not received nitro yet Heparin infusion has not been initiated yet  Initial EKG showing sinus tachycardia without dramatic ST abnormality  Past Medical History:  Diagnosis Date   Allergy    Anxiety    Arthritis    FINGERS   Cataract    COPD (chronic obstructive pulmonary disease) (HCC)    Depression    GERD (gastroesophageal reflux disease)    Hyperlipidemia    Hypertension    NSTEMI (non-ST elevated myocardial infarction) ([place]) 09/23/2022   Postmenopausal bleeding 2014; 10/202018; 05/14/18   endometrial bx done 09/2015 - nl; nl paps    Past Surgical History:  Procedure Laterality Date   COLONOSCOPY WITH PROPOFOL N/A 07/20/2019   Procedure: COLONOSCOPY WITH PROPOFOL;  Surgeon: Lucilla Lame, MD;  Location: [place];  Service: Endoscopy;  Laterality: N/A;   HYSTEROSCOPY WITH D & C N/A 06/15/2018   Procedure: DILATATION AND CURETTAGE  /HYSTEROSCOPY;  Surgeon: Homero Fellers, MD;  Location: [facility];  Service: Gynecology;  Laterality: N/A;   POLYPECTOMY  07/20/2019   Procedure: POLYPECTOMY;  Surgeon: Lucilla Lame, MD;  Location: [facility];  Service: Endoscopy;;   TUBAL LIGATION     WISDOM TOOTH EXTRACTION       Home Medications:  Prior to Admission medications   Medication Sig Start Date End Date Taking? Authorizing Provider  atorvastatin (LIPITOR) 20 MG tablet TAKE 1 TABLET(20 MG) BY MOUTH EVERY MORNING 07/07/22   Jon Billings, NP  EPINEPHrine (EPIPEN 2-PAK) 0.3 mg/0.3 mL IJ SOAJ injection Inject 0.3 mg into the muscle as needed for anaphylaxis. 03/10/22   Jon Billings, NP  FLUoxetine (PROZAC) 40 MG capsule TAKE 1 CAPSULE BY MOUTH DAILY 07/07/22   Jon Billings, NP  fluticasone [facility]) 50 MCG/ACT nasal spray Place 2 sprays into both nostrils daily. 04/12/21   Sharion Balloon, FNP  gabapentin (NEURONTIN) 100 MG capsule Take 1 capsule (100 mg total) by mouth 3 (three) times daily. 02/11/22   Jon Billings, NP  lisinopril (ZESTRIL) 20 MG tablet TAKE 1 TABLET(20 MG) BY MOUTH DAILY 04/12/22   Jon Billings, NP  Melatonin 10 MG TABS Take 10 mg by mouth at bedtime. Patient not taking: Reported on 08/19/2022    [provider]  mirtazapine (REMERON) 15 MG tablet Take 1 tablet (15 mg total) by mouth at bedtime. 04/15/22   Jon Billings, NP  omeprazole (PRILOSEC) 20 MG capsule Take 1 capsule (20 mg total) by mouth daily as needed (for acid reflux/indigestion.). 07/21/20  Volney American, [place]    Inpatient Medications: Scheduled Meds:  aspirin  325 mg Oral Daily   doxycycline  100 mg Oral Q12H   heparin  3,600 Units Intravenous Once   nicotine  21 mg Transdermal Daily   sodium chloride flush  3 mL Intravenous Q12H   Continuous Infusions:  sodium chloride     heparin     PRN Meds: acetaminophen, albuterol, ALPRAZolam, dextromethorphan-guaiFENesin, fentaNYL (SUBLIMAZE)  injection, hydrALAZINE, nitroGLYCERIN, ondansetron (ZOFRAN) IV  Allergies:    Allergies  Allergen Reactions   Ciprofloxacin Anaphylaxis   Clarithromycin Anaphylaxis   Other Anaphylaxis    ANTIBIOTICS-PT CAN TOLERATE Z-PAC AND DOXYCYCLINE BUT ALL OTHERS SHE CANNOT   Penicillin G Anaphylaxis    Has patient had a PCN reaction causing immediate rash, facial/tongue/throat swelling, SOB or lightheadedness with hypotension: Yes Has patient had a PCN reaction causing severe rash involving mucus membranes or skin necrosis: No Has patient had a PCN reaction that required hospitalization: No Has patient had a PCN reaction occurring within the last 10 years: No If all of the above answers are "NO", then may proceed with Cephalosporin use.    Codeine Itching    vomiting   Fluoride Preparations Hives   Trazodone Itching    Social History:   Social History   Socioeconomic History   Marital status: Divorced    Spouse name: Not on file   Number of children: Not on file   Years of education: Not on file   Highest education level: Not on file  Occupational History   Occupation: retired  Tobacco Use   Smoking status: Every Day    Packs/day: 0.50    Years: 44.00    Total pack years: 22.00    Types: Cigarettes   Smokeless tobacco: Never  Vaping Use   Vaping Use: Former  Substance and Sexual Activity   Alcohol use: No   Drug use: No    Comment: CBD OIL IN [place]   Sexual activity: Not Currently    Birth control/protection: Post-menopausal  Other Topics Concern   Not on file  Social History Narrative   Not on file   Social Determinants of Health   Financial Resource Strain: Low Risk  (12/30/2021)   Overall Financial Resource Strain (CARDIA)    Difficulty of Paying Living Expenses: Not very hard  Food Insecurity: No Food Insecurity (12/30/2021)   Hunger Vital Sign    Worried About Running Out of Food in the Last Year: Never true    [place] in the Last Year: Never true   Transportation Needs: No Transportation Needs (12/30/2021)   PRAPARE - Hydrologist (Medical): No    Lack of Transportation (Non-Medical): No  Physical Activity: Sufficiently Active (12/30/2021)   Exercise Vital Sign    Days of Exercise per Week: 4 days    Minutes of Exercise per Session: 50 min  Stress: No Stress Concern Present (12/30/2021)   [place]    Feeling of Stress : Only a little  Social Connections: Socially Isolated (12/30/2021)   Social Connection and Isolation Panel [NHANES]    Frequency of Communication with Friends and Family: More than three times a week    Frequency of Social Gatherings with Friends and Family: More than three times a week    Attends Religious Services: Never    Marine scientist or Organizations: No    Attends Archivist  Meetings: Never    Marital Status: Divorced  Human resources officer Violence: Not At Risk (12/30/2021)   Humiliation, Afraid, Rape, and Kick questionnaire    Fear of Current or Ex-Partner: No    Emotionally Abused: No    Physically Abused: No    Sexually Abused: No    Family History:    Family History  Problem Relation Age of Onset   Cancer Mother    Diabetes Mother    Early death Mother    Cancer Father        colon   Arthritis Father    Diabetes Sister    Diabetes Sister    Diabetes Maternal Grandmother    Diabetes Maternal Grandfather    Breast cancer Neg Hx      ROS:  Please see the history of present illness.  Review of Systems  Constitutional: Negative.   HENT: Negative.    Respiratory: Negative.    Cardiovascular:  Positive for chest pain.  Gastrointestinal: Negative.   Musculoskeletal: Negative.   Neurological: Negative.   Psychiatric/Behavioral: Negative.    All other systems reviewed and are negative.   Physical Exam/Data:   Vitals:   09/23/22 0834 09/23/22 0837  BP:  116/64  Pulse:  (!) 112   Resp:  (!) 22  Temp:  97.9 F (36.6 C)  TempSrc:  Oral  SpO2:  100%  Weight: 60.8 kg   Height: '5\' 3"'$  (1.6 m)    No intake or output data in the 24 hours ending 09/23/22 1027    09/23/2022    8:34 AM 08/19/2022    9:09 AM 03/25/2022   10:34 AM  Last 3 Weights  Weight (lbs) 134 lb 133 lb 131 lb 3.2 oz  Weight (kg) 60.782 kg 60.328 kg 59.512 kg     Body mass index is 23.74 kg/m.  General:  Well nourished, well developed, in no acute distress HEENT: normal Neck: no JVD Vascular: No carotid bruits; Distal pulses 2+ bilaterally Cardiac:  normal S1, S2; RRR; no murmur  Lungs:  clear to auscultation bilaterally, no wheezing, rhonchi or rales  Abd: soft, nontender, no hepatomegaly  Ext: no edema Musculoskeletal:  No deformities, BUE and BLE strength normal and equal Skin: warm and dry  Neuro:  CNs 2-12 intact, no focal abnormalities noted Psych:  Normal affect   EKG:  The EKG was personally reviewed and demonstrates:   Normal sinus rhythm with rate 100 bpm nonspecific ST-T wave abnormality inferior leads III and aVF Telemetry:  Telemetry was personally reviewed and demonstrates: Sinus tachycardia  Relevant CV Studies: Echocardiogram pending  Laboratory Data:  High Sensitivity Troponin:   Recent Labs  Lab 09/23/22 0837  TROPONINIHS 2,523*     Chemistry Recent Labs  Lab 09/23/22 0837  NA 141  K 3.5  CL 108  CO2 23  GLUCOSE 154*  BUN 14  CREATININE 0.99  CALCIUM 9.5  GFRNONAA >60  ANIONGAP 10    No results for input(s): "PROT", "ALBUMIN", "AST", "ALT", "ALKPHOS", "BILITOT" in the last 168 hours. Lipids No results for input(s): "CHOL", "TRIG", "HDL", "LABVLDL", "LDLCALC", "CHOLHDL" in the last 168 hours.  Hematology Recent Labs  Lab 09/23/22 0837  WBC 25.8*  RBC 4.42  HGB 13.7  HCT 42.4  MCV 95.9  MCH 31.0  MCHC 32.3  RDW 12.4  PLT 365   Thyroid No results for input(s): "TSH", "FREET4" in the last 168 hours.  BNPNo results for input(s): "BNP",  "PROBNP" in the last 168 hours.  DDimer  No results for input(s): "DDIMER" in the last 168 hours.   Radiology/Studies:  DG Chest 2 View  Result Date: 09/23/2022 CLINICAL DATA:  Chest pain EXAM: CHEST - 2 VIEW COMPARISON:  Chest x-ray dated May 08, 2021 FINDINGS: The heart size and mediastinal contours are within normal limits. New mild bibasilar opacities. The visualized skeletal structures are unremarkable. IMPRESSION: New mild bibasilar opacities, likely atelectasis, although infection or aspiration could appear similar. Recommend follow-up PA and lateral chest x-ray in 6-8 weeks to ensure resolution. Electronically Signed   By: Yetta Glassman M.D.   On: 09/23/2022 09:31     Assessment and Plan:   Non-STEMI Risk factors including hyperlipidemia, long history of smoking who continues to smoke 1 pack/day.  Does not have diabetes by history -Acute onset of chest pain concerning for angina 4 AM after busy day yesterday cleaning gutters moving heavy plants -Treatment options discussed with her we have recommended cardiac catheterization -I have reviewed the risks, indications, and alternatives to cardiac catheterization, possible angioplasty, and stenting with the patient. Risks include but are not limited to bleeding, infection, vascular injury, stroke, myocardial infection, arrhythmia, kidney injury, radiation-related injury in the case of prolonged fluoroscopy use, emergency cardiac surgery, and death. The patient understands the risks of serious complication is 1-2 in 6381 with diagnostic cardiac cath and 1-2% or less with angioplasty/stenting.  -She is in agreement, currently scheduled for 130 with Dr. Saunders Revel Heparin infusion initiated, aspirin, metoprolol, statin, Echocardiogram pending  2.  Smoker Smoking cessation recommended For smoking 1 pack/day Started on nicotine patch  3.  Hyperlipidemia Number up to 80 mg daily Goal LDL less than 60  4.  Essential hypertension On lisinopril  as outpatient, this is on hold with pending catheterization later today   Total encounter time more than 80 minutes  Greater than 50% was spent in counseling and coordination of care with the patient   For questions or updates, please contact [place] Please consult [email or web address] for contact info under    Signed, Ida Rogue, MD  09/23/2022 10:27 AM

## 2022-09-23 NOTE — ED Notes (Signed)
2nd Nitro 0.4 mg given  for 7/10 substernal CP.

## 2022-09-23 NOTE — Assessment & Plan Note (Signed)
No wheezing -As needed albuterol and Mucinex

## 2022-09-23 NOTE — ED Notes (Signed)
3rd Nitro 0.4 mg given for 5/10 substernal CP

## 2022-09-24 ENCOUNTER — Encounter: Payer: Self-pay | Admitting: Internal Medicine

## 2022-09-24 ENCOUNTER — Telehealth: Payer: Self-pay | Admitting: Cardiovascular Disease

## 2022-09-24 DIAGNOSIS — I5181 Takotsubo syndrome: Secondary | ICD-10-CM | POA: Diagnosis not present

## 2022-09-24 DIAGNOSIS — I214 Non-ST elevation (NSTEMI) myocardial infarction: Secondary | ICD-10-CM | POA: Diagnosis not present

## 2022-09-24 LAB — BASIC METABOLIC PANEL
Anion gap: 4 — ABNORMAL LOW (ref 5–15)
BUN: 14 mg/dL (ref 8–23)
CO2: 21 mmol/L — ABNORMAL LOW (ref 22–32)
Calcium: 8.7 mg/dL — ABNORMAL LOW (ref 8.9–10.3)
Chloride: 114 mmol/L — ABNORMAL HIGH (ref 98–111)
Creatinine, Ser: 0.79 mg/dL (ref 0.44–1.00)
GFR, Estimated: >60 mL/min (ref >60)
Glucose, Bld: 103 mg/dL — ABNORMAL HIGH (ref 70–99)
Potassium: 3.9 mmol/L (ref 3.5–5.1)
Sodium: 139 mmol/L (ref 135–145)

## 2022-09-24 LAB — URINE DRUG SCREEN, QUALITATIVE (ARMC ONLY)
Amphetamines, Ur Screen: NOT DETECTED
Barbiturates, Ur Screen: NOT DETECTED
Benzodiazepine, Ur Scrn: POSITIVE — AB
Cannabinoid 50 Ng, Ur ~~LOC~~: POSITIVE — AB
Cocaine Metabolite,Ur ~~LOC~~: NOT DETECTED
MDMA (Ecstasy)Ur Screen: NOT DETECTED
Methadone Scn, Ur: NOT DETECTED
Opiate, Ur Screen: NOT DETECTED
Phencyclidine (PCP) Ur S: NOT DETECTED
Tricyclic, Ur Screen: NOT DETECTED

## 2022-09-24 LAB — CBC
HCT: 31.4 % — ABNORMAL LOW (ref 36.0–46.0)
Hemoglobin: 10.2 g/dL — ABNORMAL LOW (ref 12.0–15.0)
MCH: 30.8 pg (ref 26.0–34.0)
MCHC: 32.5 g/dL (ref 30.0–36.0)
MCV: 94.9 fL (ref 80.0–100.0)
Platelets: 248 10*3/uL (ref 150–400)
RBC: 3.31 MIL/uL — ABNORMAL LOW (ref 3.87–5.11)
RDW: 12.7 % (ref 11.5–15.5)
WBC: 15.8 10*3/uL — ABNORMAL HIGH (ref 4.0–10.5)
nRBC: 0 % (ref 0.0–0.2)

## 2022-09-24 LAB — RESP PANEL BY RT-PCR (FLU A&B, COVID) ARPGX2
Influenza A by PCR: NEGATIVE
Influenza B by PCR: NEGATIVE
SARS Coronavirus 2 by RT PCR: NEGATIVE

## 2022-09-24 LAB — HIV ANTIBODY (ROUTINE TESTING W REFLEX): HIV Screen 4th Generation wRfx: NONREACTIVE

## 2022-09-24 LAB — GLUCOSE, CAPILLARY: Glucose-Capillary: 106 mg/dL — ABNORMAL HIGH (ref 70–99)

## 2022-09-24 LAB — STREP PNEUMONIAE URINARY ANTIGEN: Strep Pneumo Urinary Antigen: NEGATIVE

## 2022-09-24 LAB — LACTIC ACID, PLASMA
Lactic Acid, Venous: 0.8 mmol/L (ref 0.5–1.9)
Lactic Acid, Venous: 1 mmol/L (ref 0.5–1.9)

## 2022-09-24 MED ORDER — ATORVASTATIN CALCIUM 40 MG PO TABS: 40.0000 mg | ORAL_TABLET | Freq: Every evening | ORAL | 1 refills | Status: DC

## 2022-09-24 MED ORDER — LISINOPRIL 20 MG PO TABS: ORAL_TABLET | ORAL | 1 refills | Status: DC

## 2022-09-24 MED ORDER — LACTATED RINGERS IV BOLUS
250.0000 mL | Freq: Once | INTRAVENOUS | Status: AC
  Administered 2022-09-24: 250 mL via INTRAVENOUS

## 2022-09-24 MED ORDER — NICOTINE 21 MG/24HR TD PT24: 21.0000 mg | MEDICATED_PATCH | Freq: Every day | TRANSDERMAL | 0 refills | Status: DC

## 2022-09-24 MED ORDER — METOPROLOL SUCCINATE ER 25 MG PO TB24: 12.5000 mg | ORAL_TABLET | Freq: Every day | ORAL | 1 refills | Status: DC

## 2022-09-24 MED ORDER — ASPIRIN 81 MG PO CHEW: 81.0000 mg | CHEWABLE_TABLET | Freq: Every day | ORAL | 1 refills | Status: AC

## 2022-09-24 MED ORDER — ALUM & MAG HYDROXIDE-SIMETH 200-200-20 MG/5ML PO SUSP: 30.0000 mL | Freq: Four times a day (QID) | ORAL | Status: DC | PRN

## 2022-09-24 MED ORDER — ASPIRIN 81 MG PO CHEW
81.0000 mg | CHEWABLE_TABLET | Freq: Every day | ORAL | Status: DC
  Administered 2022-09-24: 81 mg via ORAL
  Filled 2022-09-24: qty 1

## 2022-09-24 MED ORDER — ATORVASTATIN CALCIUM 20 MG PO TABS: 40.0000 mg | ORAL_TABLET | Freq: Every evening | ORAL | Status: DC

## 2022-09-24 NOTE — Plan of Care (Signed)
  Problem: Activity: Goal: Ability to tolerate increased activity will improve Outcome: Progressing   Problem: Clinical Measurements: Goal: Ability to maintain a body temperature in the normal range will improve Outcome: Progressing   Problem: Respiratory: Goal: Ability to maintain adequate ventilation will improve Outcome: Progressing Goal: Ability to maintain a clear airway will improve Outcome: Progressing   Problem: Education: Goal: Understanding of CV disease, CV risk reduction, and recovery process will improve Outcome: Progressing Goal: Individualized Educational Video(s) Outcome: Progressing   Problem: Activity: Goal: Ability to return to baseline activity level will improve Outcome: Progressing   Problem: Cardiovascular: Goal: Ability to achieve and maintain adequate cardiovascular perfusion will improve Outcome: Progressing Goal: Vascular access site(s) Level 0-1 will be maintained Outcome: Progressing   Problem: Health Behavior/Discharge Planning: Goal: Ability to safely manage health-related needs after discharge will improve Outcome: Progressing   Problem: Education: Goal: Knowledge of General Education information will improve Description: Including pain rating scale, medication(s)/side effects and non-pharmacologic comfort measures Outcome: Progressing   Problem: Health Behavior/Discharge Planning: Goal: Ability to manage health-related needs will improve Outcome: Progressing   Problem: Clinical Measurements: Goal: Ability to maintain clinical measurements within normal limits will improve Outcome: Progressing Goal: Will remain free from infection Outcome: Progressing Goal: Diagnostic test results will improve Outcome: Progressing Goal: Respiratory complications will improve Outcome: Progressing Goal: Cardiovascular complication will be avoided Outcome: Progressing   Problem: Activity: Goal: Risk for activity intolerance will decrease Outcome:  Progressing   Problem: Nutrition: Goal: Adequate nutrition will be maintained Outcome: Progressing   Problem: Coping: Goal: Level of anxiety will decrease Outcome: Progressing   Problem: Elimination: Goal: Will not experience complications related to bowel motility Outcome: Progressing Goal: Will not experience complications related to urinary retention Outcome: Progressing   Problem: Pain Managment: Goal: General experience of comfort will improve Outcome: Progressing   Problem: Safety: Goal: Ability to remain free from injury will improve Outcome: Progressing   Problem: Skin Integrity: Goal: Risk for impaired skin integrity will decrease Outcome: Progressing

## 2022-09-24 NOTE — Hospital Course (Addendum)
Taken from H&P.  Valerie Donovan is a 66 y.o. female with medical history significant of hypertension, hyperlipidemia, COPD, GERD, allergy to multiple medications, depression with anxiety, tobacco abuse, who presents with chest pain, started around 4 AM on the day of admission.  Substernal, pressure-like, 7/10 in severity and radiating to the left arm, associated with shortness of breath and diaphoresis.  Rest of the ROS was negative.  Data reviewed independently and ED Course: pt was found to have WBC 25.8, troponin 2523>>2934, GFR> 60, temperature normal, blood pressure 116/64, heart rate 112, RR 22, oxygen saturation 100% on room air.  Chest x-ray showed bilateral basilar opacity.     EKG: I have personally reviewed.  Sinus rhythm, QTc 433, low voltage, seems to have T wave inversion in lead III/aVF.  Cardiology was consulted and she was taken to Cath Lab.  Which shows nonocclusive cardiomyopathy with severely depressed left ventricular function and especially apical and mid region consistent with Takotsubo cardiomyopathy.  They are recommending gentle diuresis if the blood pressure allows. Echo and cardiac catheterization with EF of 30 to 35%. She Was Also Started on Doxycycline Empirically for Concern of community-acquired pneumonia.  10/20: Blood pressure on softer side.  Procalcitonin negative. Lactic acidosis has been resolved.  Pneumo antigen negative.  UDS positive for cannabinoid and benzo.  Preliminary blood cultures negative in less than 12 hours.  Lipid panel within normal limit with LDL of 97, lipoprotein a pending.  A1c of 5.7 COVID and influenza PCR negative.  Leukocytosis improving.  Decrease in hemoglobin to 10.2 from 13.7 on admission.  No obvious bleeding. Patient wants to go home and cardiology agreed with discharge. She has been started on aspirin, and metoprolol, increasing the dose of statin. She was advised to hold lisinopril due to softer blood pressure. She will also  provided with referral for cardiac rehab. She needs to have a close follow-up with cardiology for further recommendations.  She will continue on current medications and follow-up with her providers.

## 2022-09-24 NOTE — Telephone Encounter (Signed)
Gerrie Nordmann, NP  P C[facility] Scheduling Please schedule patient hospital follow-up in 2-3 weeks with MD or APP s/p LHC  Thank you     LMOV to schedule

## 2022-09-24 NOTE — TOC CM/SW Note (Signed)
Patient has orders to discharge home today. Chart reviewed. No TOC needs identified. CSW signing off.  Alishia Lebo, CSW [phone]  

## 2022-09-24 NOTE — Progress Notes (Signed)
Rounding Note    Patient Name: Valerie Donovan Date of Encounter: 09/24/2022  [place] Cardiologist: None  New consult done by Dr Rockey Situ Subjective   Patient seen on AM rounds. Denies any chest pain or shortness of breath. Noted blood pressure drop during the night to 72/38 with IVF bolus given and recovery in blood pressure was documented. Patient has been getting in and out of bed this morning without further instances of lower blood pressures  Inpatient Medications    Scheduled Meds:  aspirin  81 mg Oral Daily   atorvastatin  80 mg Oral QPM   doxycycline  100 mg Oral Q12H   enoxaparin (LOVENOX) injection  40 mg Subcutaneous Q24H   FLUoxetine  40 mg Oral Daily   metoprolol succinate  12.5 mg Oral QHS   mirtazapine  15 mg Oral QHS   nicotine  21 mg Transdermal Daily   pantoprazole  40 mg Oral Daily   sodium chloride flush  3 mL Intravenous Q12H   sodium chloride flush  3 mL Intravenous Q12H   Continuous Infusions:  sodium chloride Stopped (09/24/22 0500)   PRN Meds: sodium chloride, acetaminophen, albuterol, ALPRAZolam, dextromethorphan-guaiFENesin, EPINEPHrine, fentaNYL (SUBLIMAZE) injection, fluticasone, ondansetron (ZOFRAN) IV, sodium chloride flush   Vital Signs    Vitals:   09/24/22 0610 09/24/22 0614 09/24/22 0617 09/24/22 0626  BP: (!) 81/54 (!) 91/58 (!) 92/57   Pulse: 78 79 78 76  Resp:      Temp:    98.9 F (37.2 C)  TempSrc:      SpO2:   94% 92%  Weight:      Height:        Intake/Output Summary (Last 24 hours) at 09/24/2022 0800 Last data filed at 09/23/2022 1320 Gross per 24 hour  Intake 1003 ml  Output --  Net 1003 ml      09/23/2022    8:34 AM 08/19/2022    9:09 AM 03/25/2022   10:34 AM  Last 3 Weights  Weight (lbs) 134 lb 133 lb 131 lb 3.2 oz  Weight (kg) 60.782 kg 60.328 kg 59.512 kg      Telemetry    Sinus rates in the 80's - Personally Reviewed  ECG    No new tracings - Personally Reviewed  Physical Exam    GEN: No acute distress.   Neck: No JVD Cardiac: RRR, no murmurs, rubs, or gallops. Right radial cath site with gauze and opsite dressing clean,dry, and intact. 2+ radial pulse without bleeding or hematoma. Respiratory: Clear to auscultation bilaterally. GI: Soft, nontender, non-distended  MS: No edema; No deformity. Neuro:  Nonfocal  Psych: Normal affect   Labs    High Sensitivity Troponin:   Recent Labs  Lab 09/23/22 0837 09/23/22 1017  TROPONINIHS 2,523* 2,934*     Chemistry Recent Labs  Lab 09/23/22 0837 09/24/22 0138  NA 141 139  K 3.5 3.9  CL 108 114*  CO2 23 21*  GLUCOSE 154* 103*  BUN 14 14  CREATININE 0.99 0.79  CALCIUM 9.5 8.7*  GFRNONAA >60 >60  ANIONGAP 10 4*    Lipids  Recent Labs  Lab 09/23/22 1017  CHOL 164  TRIG 100  HDL 47  LDLCALC 97  CHOLHDL 3.5    Hematology Recent Labs  Lab 09/23/22 0837 09/24/22 0138  WBC 25.8* 15.8*  RBC 4.42 3.31*  HGB 13.7 10.2*  HCT 42.4 31.4*  MCV 95.9 94.9  MCH 31.0 30.8  MCHC 32.3 32.5  RDW  12.4 12.7  PLT 365 248   Thyroid No results for input(s): "TSH", "FREET4" in the last 168 hours.  BNPNo results for input(s): "BNP", "PROBNP" in the last 168 hours.  DDimer No results for input(s): "DDIMER" in the last 168 hours.   Radiology      Cardiac Studies  TTE 09/23/2022 1. Left ventricular ejection fraction, by estimation, is 30 to 35%. The  left ventricle has moderately decreased function. The left ventricle  demonstrates global hypokinesis with preserved wall motion of the basal  regions suggested of stress  cardiomyopathy. Left ventricular diastolic parameters are consistent with  Grade I diastolic dysfunction (impaired relaxation).   2. Right ventricular systolic function is normal. The right ventricular  size is normal.   3. The mitral valve is normal in structure. Mild mitral valve  regurgitation. No evidence of mitral stenosis.   4. Tricuspid valve regurgitation is moderate.   5. The  aortic valve is normal in structure. Aortic valve regurgitation is  not visualized. Aortic valve sclerosis/calcification is present, without  any evidence of aortic stenosis. Aortic valve mean gradient measures 5.0  mmHg.   6. The inferior vena cava is normal in size with greater than 50%  respiratory variability, suggesting right atrial pressure of 3 mmHg.   LHC 09/23/2022 Conclusions: Moderate, nonobstructive coronary artery disease with 40% proximal LAD and mid RCA stenoses. Severely reduced left ventricular systolic function (LVEF 46-96%) with mid and apical akinesis and preserved basal function consistent with Takotsubo (stress-induced) cardiomyopathy. Mildly elevated left ventricular filling pressure (LVEDP 20 mmHg).   Recommendations: Gentle diuresis and judicious initiation of goal-directed medical therapy as blood pressure and renal function allow. Medical therapy and risk factor modification to prevent progression of moderate, nonobstructive coronary artery disease.  Patient Profile     66 y.o. female with a history of smoking 1ppd, hyperlipidemia, and hypertension, who presented with chest pressure, who is being seen and evaluated for NSTEMI s/p LHC.  Assessment & Plan    NSTEMI -with LHC completed on 09/24/22 with moderate nonobstructive disease consistent with Takotsubo, medical therapy and risk factor modification -remains chest pain free this morning -no changes noted on telemetry -continued on asa, bb, and statin therapy -continue telemetry monitoring with increased activity to ensure no further episodes of chest discomfort  Tobacco abuse -total cessation recommended -continue Nicoderm patches  Hypertension -blood pressure dropped into the 70's overnight requiring fluid resuscitation, responded well without further incident - blood pressure this morning 103/53 -continue toprol xl 12.'5mg'$  daily -lisinopril remains on hold due to soft bp  Hyperlipidemia -last LDL  97 09/23/22 -continue atorvastatin 80 mg daily -will need lipid panel and LFT in 8-10 weeks       For questions or updates, please contact [place] Please consult [email or web address] for contact info under        Signed, Jarom Govan, NP  09/24/2022, 8:00 AM

## 2022-09-24 NOTE — Progress Notes (Signed)
       CROSS COVER NOTE  NAME: Redonna Wilbert Janish MRN: [id number] DOB : 18-Apr-1956    Date of Service   09/24/2022  HPI/Events of Note   Asymptomatic hypotension episode - 72/38 asymptomatic. Heart rate 72 and no change in mentation  Assessment and  Interventions   Assessment:  Plan: Small volume fluid bolus over 1 hour with bp check 30 min after bolus start        Kathlene Cote NP [facility]

## 2022-09-24 NOTE — Plan of Care (Signed)
  Problem: Activity: Goal: Ability to tolerate increased activity will improve Outcome: Adequate for Discharge   Problem: Clinical Measurements: Goal: Ability to maintain a body temperature in the normal range will improve Outcome: Adequate for Discharge   Problem: Respiratory: Goal: Ability to maintain adequate ventilation will improve Outcome: Adequate for Discharge Goal: Ability to maintain a clear airway will improve Outcome: Adequate for Discharge   Problem: Education: Goal: Understanding of CV disease, CV risk reduction, and recovery process will improve Outcome: Adequate for Discharge Goal: Individualized Educational Video(s) Outcome: Adequate for Discharge   Problem: Activity: Goal: Ability to return to baseline activity level will improve Outcome: Adequate for Discharge   Problem: Cardiovascular: Goal: Ability to achieve and maintain adequate cardiovascular perfusion will improve Outcome: Adequate for Discharge Goal: Vascular access site(s) Level 0-1 will be maintained Outcome: Adequate for Discharge   Problem: Health Behavior/Discharge Planning: Goal: Ability to safely manage health-related needs after discharge will improve Outcome: Adequate for Discharge   Problem: Education: Goal: Knowledge of General Education information will improve Description: Including pain rating scale, medication(s)/side effects and non-pharmacologic comfort measures Outcome: Adequate for Discharge   Problem: Health Behavior/Discharge Planning: Goal: Ability to manage health-related needs will improve Outcome: Adequate for Discharge   Problem: Clinical Measurements: Goal: Ability to maintain clinical measurements within normal limits will improve Outcome: Adequate for Discharge Goal: Will remain free from infection Outcome: Adequate for Discharge Goal: Diagnostic test results will improve Outcome: Adequate for Discharge Goal: Respiratory complications will improve Outcome:  Adequate for Discharge Goal: Cardiovascular complication will be avoided Outcome: Adequate for Discharge   Problem: Activity: Goal: Risk for activity intolerance will decrease Outcome: Adequate for Discharge   Problem: Nutrition: Goal: Adequate nutrition will be maintained Outcome: Adequate for Discharge   Problem: Coping: Goal: Level of anxiety will decrease Outcome: Adequate for Discharge   Problem: Elimination: Goal: Will not experience complications related to bowel motility Outcome: Adequate for Discharge Goal: Will not experience complications related to urinary retention Outcome: Adequate for Discharge   Problem: Pain Managment: Goal: General experience of comfort will improve Outcome: Adequate for Discharge   Problem: Safety: Goal: Ability to remain free from injury will improve Outcome: Adequate for Discharge   Problem: Skin Integrity: Goal: Risk for impaired skin integrity will decrease Outcome: Adequate for Discharge

## 2022-09-24 NOTE — Discharge Summary (Signed)
Physician Discharge Summary   Patient: Valerie Donovan MRN: [id number] DOB: 1956/06/23  Admit date:     09/23/2022  Discharge date: 09/24/22  Discharge Physician: Lorella Nimrod   PCP: Jon Billings, NP   Recommendations at discharge:  Please obtain CBC and BMP in 1 week Follow-up with cardiac rehab 3.   Follow-up with cardiology within a week  Discharge Diagnoses: Principal Problem:   NSTEMI (non-ST elevated myocardial infarction) ([place]) Active Problems:   HLD (hyperlipidemia)   Hypertension   Depression with anxiety   CAP (community acquired pneumonia)   Sepsis ([place])   COPD (chronic obstructive pulmonary disease) ([place])   Tobacco abuse   Hospital Course: Taken from H&P.  Valerie Donovan is a 66 y.o. female with medical history significant of hypertension, hyperlipidemia, COPD, GERD, allergy to multiple medications, depression with anxiety, tobacco abuse, who presents with chest pain, started around 4 AM on the day of admission.  Substernal, pressure-like, 7/10 in severity and radiating to the left arm, associated with shortness of breath and diaphoresis.  Rest of the ROS was negative.  Data reviewed independently and ED Course: pt was found to have WBC 25.8, troponin 2523>>2934, GFR> 60, temperature normal, blood pressure 116/64, heart rate 112, RR 22, oxygen saturation 100% on room air.  Chest x-ray showed bilateral basilar opacity.     EKG: I have personally reviewed.  Sinus rhythm, QTc 433, low voltage, seems to have T wave inversion in lead III/aVF.  Cardiology was consulted and she was taken to Cath Lab.  Which shows nonocclusive cardiomyopathy with severely depressed left ventricular function and especially apical and mid region consistent with Takotsubo cardiomyopathy.  They are recommending gentle diuresis if the blood pressure allows. Echo and cardiac catheterization with EF of 30 to 35%. She Was Also Started on Doxycycline Empirically for Concern of  community-acquired pneumonia.  10/20: Blood pressure on softer side.  Procalcitonin negative. Lactic acidosis has been resolved.  Pneumo antigen negative.  UDS positive for cannabinoid and benzo.  Preliminary blood cultures negative in less than 12 hours.  Lipid panel within normal limit with LDL of 97, lipoprotein a pending.  A1c of 5.7 COVID and influenza PCR negative.  Leukocytosis improving.  Decrease in hemoglobin to 10.2 from 13.7 on admission.  No obvious bleeding. Patient wants to go home and cardiology agreed with discharge. She has been started on aspirin, and metoprolol, increasing the dose of statin. She was advised to hold lisinopril due to softer blood pressure. She will also provided with referral for cardiac rehab. She needs to have a close follow-up with cardiology for further recommendations.  She will continue on current medications and follow-up with her providers.  Assessment and Plan: * NSTEMI (non-ST elevated myocardial infarction) ([place]) Trop 2523.  Consulted Dr. Rockey Situ of cardiology, who is planning to do cardiac catheter today. - admit to tele bed as inpatient - IV heparin  - Trend Trop - prn Nitroglycerin, fentanyl - aspirin, lipitor  - Risk factor stratification: will check FLP and A1C  - check UDS    HLD (hyperlipidemia) -lipitor 80 mg daily  Hypertension - IV hydralazine as needed -Lisinopril is on hold due to going to cardic cath -Added metoprolol 25 mg twice daily per Dr. Rockey Situ  Depression with anxiety - Continue home Prozac and Remeron -Add as needed Xanax for anxiety  CAP (community acquired pneumonia) Sepsis due to possible CAP: Patient has significant leukocytosis with WBC 25.8, but no fever.  Chest x-ray showed bilateral basilar opacity.  Another possibility is aspiration due to nausea vomiting. Patient meets criteria for sepsi with WBC 25.8, heart rate 112, RR 22.  Patient is allergic to multiple medications and antibiotics.  -Start  doxycycline empirically -As needed albuterol and Mucinex -Blood culture, sputum culture -Urine antigen of streptococcal -IV fluid: 500 cc normal saline, then 75 cc/h --> will give more NS bolus if lactic acid level is increased -Trend lactic acid level -Check procalcitonin level  Sepsis ([facility]) -see above  COPD (chronic obstructive pulmonary disease) ([facility]) No wheezing -As needed albuterol and Mucinex  Tobacco abuse - Did counseling about importance of quitting smoking -Nicotine patch   Consultants: Cardiology Procedures performed: Catheterization Disposition: Home Diet recommendation:  Discharge Diet Orders (From admission, onward)     Start     Ordered   09/24/22 0000  Diet - low sodium heart healthy        09/24/22 1422           Cardiac diet DISCHARGE MEDICATION: Allergies as of 09/24/2022       Reactions   Ciprofloxacin Anaphylaxis   Clarithromycin Anaphylaxis   Other Anaphylaxis   ANTIBIOTICS-PT CAN TOLERATE Z-PAC AND DOXYCYCLINE BUT ALL OTHERS SHE CANNOT   Penicillin G Anaphylaxis   Has patient had a PCN reaction causing immediate rash, facial/tongue/throat swelling, SOB or lightheadedness with hypotension: Yes Has patient had a PCN reaction causing severe rash involving mucus membranes or skin necrosis: No Has patient had a PCN reaction that required hospitalization: No Has patient had a PCN reaction occurring within the last 10 years: No If all of the above answers are "NO", then may proceed with Cephalosporin use.   Codeine Itching   vomiting   Fluoride Preparations Hives   Trazodone Itching        Medication List     STOP taking these medications    gabapentin 100 MG capsule Commonly known as: NEURONTIN       TAKE these medications    aspirin 81 MG chewable tablet Chew 1 tablet (81 mg total) by mouth daily. Start taking on: September 25, 2022   atorvastatin 40 MG tablet Commonly known as: LIPITOR Take 1 tablet (40 mg total) by mouth  every evening. What changed:  medication strength See the new instructions.   EPINEPHrine 0.3 mg/0.3 mL Soaj injection Commonly known as: EpiPen 2-Pak Inject 0.3 mg into the muscle as needed for anaphylaxis.   FLUoxetine 40 MG capsule Commonly known as: PROZAC TAKE 1 CAPSULE BY MOUTH DAILY   fluticasone 50 MCG/ACT nasal spray Commonly known as: FLONASE Place 2 sprays into both nostrils daily.   lisinopril 20 MG tablet Commonly known as: ZESTRIL Hold until you see your cardiologist What changed: See the new instructions.   Melatonin 10 MG Tabs Take 10 mg by mouth at bedtime.   metoprolol succinate 25 MG 24 hr tablet Commonly known as: TOPROL-XL Take 0.5 tablets (12.5 mg total) by mouth at bedtime.   mirtazapine 15 MG tablet Commonly known as: REMERON Take 1 tablet (15 mg total) by mouth at bedtime.   nicotine 21 mg/24hr patch Commonly known as: NICODERM CQ - dosed in mg/24 hours Place 1 patch (21 mg total) onto the skin daily. Start taking on: September 25, 2022   omeprazole 20 MG capsule Commonly known as: PRILOSEC Take 1 capsule (20 mg total) by mouth daily as needed (for acid reflux/indigestion.).        Discharge Exam: Filed Weights   09/23/22 0834  Weight: 60.8 kg  General. In no acute distress. Pulmonary.  Lungs clear bilaterally, normal respiratory effort. CV.  Regular rate and rhythm, no JVD, rub or murmur. Abdomen.  Soft, nontender, nondistended, BS positive. CNS.  Alert and oriented .  No focal neurologic deficit. Extremities.  No edema, no cyanosis, pulses intact and symmetrical. Psychiatry.  Judgment and insight appears normal.   Condition at discharge: stable  The results of significant diagnostics from this hospitalization (including imaging, microbiology, ancillary and laboratory) are listed below for reference.   Imaging Studies: CARDIAC CATHETERIZATION  Result Date: 09/23/2022 Conclusions: Moderate, nonobstructive coronary artery  disease with 40% proximal LAD and mid RCA stenoses. Severely reduced left ventricular systolic function (LVEF 69-48%) with mid and apical akinesis and preserved basal function consistent with Takotsubo (stress-induced) cardiomyopathy. Mildly elevated left ventricular filling pressure (LVEDP 20 mmHg). Recommendations: Gentle diuresis and judicious initiation of goal-directed medical therapy as blood pressure and renal function allow. Medical therapy and risk factor modification to prevent progression of moderate, nonobstructive coronary artery disease. Nelva Bush, MD [facility]  ECHOCARDIOGRAM COMPLETE  Result Date: 09/23/2022    ECHOCARDIOGRAM REPORT   Patient Name:   Valerie Donovan Date of Exam: 09/23/2022 Medical Rec #:  [id number]         Height:       63.0 in Accession #:    [id number]        Weight:       134.0 lb Date of Birth:  May 31, 1956         BSA:          1.631 m Patient Age:    37 years          BP:           123/82 mmHg Patient Gender: F                 HR:           105 bpm. Exam Location:  [facility] Procedure: 2D Echo, Color Doppler, Cardiac Doppler and Intracardiac            Opacification Agent Indications:     I21.4 NSTEMI  History:         Patient has no prior history of Echocardiogram examinations.                  COPD; Risk Factors:Hypertension and Dyslipidemia.  Sonographer:     Charmayne Sheer Referring Phys:  [place] Phys: Ida Rogue MD IMPRESSIONS  1. Left ventricular ejection fraction, by estimation, is 30 to 35%. The left ventricle has moderately decreased function. The left ventricle demonstrates global hypokinesis with preserved wall motion of the basal regions suggested of stress cardiomyopathy. Left ventricular diastolic parameters are consistent with Grade I diastolic dysfunction (impaired relaxation).  2. Right ventricular systolic function is normal. The right ventricular size is normal.  3. The mitral valve is normal in structure. Mild mitral  valve regurgitation. No evidence of mitral stenosis.  4. Tricuspid valve regurgitation is moderate.  5. The aortic valve is normal in structure. Aortic valve regurgitation is not visualized. Aortic valve sclerosis/calcification is present, without any evidence of aortic stenosis. Aortic valve mean gradient measures 5.0 mmHg.  6. The inferior vena cava is normal in size with greater than 50% respiratory variability, suggesting right atrial pressure of 3 mmHg. FINDINGS  Left Ventricle: Left ventricular ejection fraction, by estimation, is 30 to 35%. The left ventricle has moderately decreased function. The left ventricle demonstrates global hypokinesis.  Definity contrast agent was given IV to delineate the left ventricular endocardial borders. The left ventricular internal cavity size was normal in size. There is no left ventricular hypertrophy. Left ventricular diastolic parameters are consistent with Grade I diastolic dysfunction (impaired relaxation). Right Ventricle: The right ventricular size is normal. No increase in right ventricular wall thickness. Right ventricular systolic function is normal. Left Atrium: Left atrial size was normal in size. Right Atrium: Right atrial size was normal in size. Pericardium: There is no evidence of pericardial effusion. Mitral Valve: The mitral valve is normal in structure. Mild mitral valve regurgitation. No evidence of mitral valve stenosis. Tricuspid Valve: The tricuspid valve is normal in structure. Tricuspid valve regurgitation is moderate . No evidence of tricuspid stenosis. Aortic Valve: The aortic valve is normal in structure. Aortic valve regurgitation is not visualized. Aortic valve sclerosis/calcification is present, without any evidence of aortic stenosis. Aortic valve mean gradient measures 5.0 mmHg. Aortic valve peak  gradient measures 8.6 mmHg. Aortic valve area, by VTI measures 3.68 cm. Pulmonic Valve: The pulmonic valve was normal in structure. Pulmonic valve  regurgitation is not visualized. No evidence of pulmonic stenosis. Aorta: The aortic root is normal in size and structure. Venous: The inferior vena cava is normal in size with greater than 50% respiratory variability, suggesting right atrial pressure of 3 mmHg. IAS/Shunts: No atrial level shunt detected by color flow Doppler.  LEFT VENTRICLE PLAX 2D LVIDd:         4.10 cm   Diastology LVIDs:         2.80 cm   LV e' medial:    7.51 cm/s LV PW:         0.90 cm   LV E/e' medial:  11.8 LV IVS:        1.00 cm   LV e' lateral:   9.79 cm/s LVOT diam:     2.10 cm   LV E/e' lateral: 9.1 LV SV:         87 LV SV Index:   53 LVOT Area:     3.46 cm  RIGHT VENTRICLE RV Basal diam:  2.40 cm LEFT ATRIUM             Index        RIGHT ATRIUM          Index LA diam:        3.30 cm 2.02 cm/m   RA Area:     8.08 cm LA Vol (A2C):   34.8 ml 21.34 ml/m  RA Volume:   14.10 ml 8.64 ml/m LA Vol (A4C):   30.1 ml 18.45 ml/m LA Biplane Vol: 32.9 ml 20.17 ml/m  AORTIC VALVE                     PULMONIC VALVE AV Area (Vmax):    3.30 cm      PV Vmax:       1.05 m/s AV Area (Vmean):   3.36 cm      PV Vmean:      74.300 cm/s AV Area (VTI):     3.68 cm      PV VTI:        0.172 m AV Vmax:           147.00 cm/s   PV Peak grad:  4.4 mmHg AV Vmean:          101.000 cm/s  PV Mean grad:  3.0 mmHg AV VTI:  0.235 m AV Peak Grad:      8.6 mmHg AV Mean Grad:      5.0 mmHg LVOT Vmax:         140.00 cm/s LVOT Vmean:        97.900 cm/s LVOT VTI:          0.250 m LVOT/AV VTI ratio: 1.06  AORTA Ao Root diam: 2.60 cm MITRAL VALVE                TRICUSPID VALVE MV Area (PHT): 5.62 cm     TR Peak grad:   36.7 mmHg MV Decel Time: 135 msec     TR Vmax:        303.00 cm/s MV E velocity: 88.60 cm/s MV A velocity: 121.00 cm/s  SHUNTS MV E/A ratio:  0.73         Systemic VTI:  0.25 m                             Systemic Diam: 2.10 cm Ida Rogue MD Electronically signed by Ida Rogue MD Signature Date/Time: 09/23/2022/4:54:39 PM    Final     DG Chest 2 View  Result Date: 09/23/2022 CLINICAL DATA:  Chest pain EXAM: CHEST - 2 VIEW COMPARISON:  Chest x-ray dated May 08, 2021 FINDINGS: The heart size and mediastinal contours are within normal limits. New mild bibasilar opacities. The visualized skeletal structures are unremarkable. IMPRESSION: New mild bibasilar opacities, likely atelectasis, although infection or aspiration could appear similar. Recommend follow-up PA and lateral chest x-ray in 6-8 weeks to ensure resolution. Electronically Signed   By: Yetta Glassman M.D.   On: 09/23/2022 09:31   MM DIAG BREAST TOMO UNI LEFT  Result Date: 09/03/2022 CLINICAL DATA:  Callback for LEFT breast asymmetry seen on MLO view only EXAM: DIGITAL DIAGNOSTIC UNILATERAL LEFT MAMMOGRAM WITH TOMOSYNTHESIS TECHNIQUE: Left digital diagnostic mammography and breast tomosynthesis was performed. COMPARISON:  Previous exam(s). ACR Breast Density Category c: The breast tissue is heterogeneously dense, which may obscure small masses. FINDINGS: The previously described finding does not persist with additional views, consistent with superimposed fibroglandular tissue. No suspicious mass, microcalcification, or other finding is identified. IMPRESSION: No mammographic evidence of malignancy. RECOMMENDATION: Screening mammogram in one year.(Code:[id number]-01Y) I have discussed the findings and recommendations with the patient. If applicable, a reminder letter will be sent to the patient regarding the next appointment. BI-RADS CATEGORY  1: Negative. Electronically Signed   By: Valentino Saxon M.D.   On: 09/03/2022 09:18    Microbiology: Results for orders placed or performed during the hospital encounter of 09/23/22  Blood culture (routine x 2)     Status: None (Preliminary result)   Collection Time: 09/23/22 10:17 AM   Specimen: BLOOD  Result Value Ref Range Status   Specimen Description BLOOD BLOOD RIGHT ARM  Final   Special Requests   Final    BOTTLES DRAWN  AEROBIC AND ANAEROBIC Blood Culture results may not be optimal due to an excessive volume of blood received in culture bottles   Culture   Final    NO GROWTH < 24 HOURS Performed at [facility], [address] [place], [place] [address]    Report Status PENDING  Incomplete  Blood culture (routine x 2)     Status: None (Preliminary result)   Collection Time: 09/24/22  1:38 AM   Specimen: BLOOD  Result Value Ref Range Status   Specimen Description  BLOOD BLOOD LEFT ARM  Final   Special Requests   Final    BOTTLES DRAWN AEROBIC AND ANAEROBIC Blood Culture results may not be optimal due to an excessive volume of blood received in culture bottles   Culture   Final    NO GROWTH < 12 HOURS Performed at [facility], [address] [place], [place] [address]    Report Status PENDING  Incomplete  Resp Panel by RT-PCR (Flu A&B, Covid) Anterior Nasal Swab     Status: None   Collection Time: 09/24/22  8:26 AM   Specimen: Anterior Nasal Swab  Result Value Ref Range Status   SARS Coronavirus 2 by RT PCR NEGATIVE NEGATIVE Final    Comment: (NOTE) SARS-CoV-2 target nucleic acids are NOT DETECTED.  The SARS-CoV-2 RNA is generally detectable in upper respiratory specimens during the acute phase of infection. The lowest concentration of SARS-CoV-2 viral copies this assay can detect is 138 copies/mL. A negative result does not preclude SARS-Cov-2 infection and should not be used as the sole basis for treatment or other patient management decisions. A negative result may occur with  improper specimen collection/handling, submission of specimen other than nasopharyngeal swab, presence of viral mutation(s) within the areas targeted by this assay, and inadequate number of viral copies(<138 copies/mL). A negative result must be combined with clinical observations, patient history, and epidemiological information. The expected result is Negative.  Fact Sheet for Patients:   EntrepreneurPulse.com.au  Fact Sheet for Healthcare Providers:  IncredibleEmployment.be  This test is no t yet approved or cleared by the [place] FDA and  has been authorized for detection and/or diagnosis of SARS-CoV-2 by FDA under an Emergency Use Authorization (EUA). This EUA will remain  in effect (meaning this test can be used) for the duration of the COVID-19 declaration under Section 564(b)(1) of the Act, 21 U.S.C.section 360bbb-3(b)(1), unless the authorization is terminated  or revoked sooner.       Influenza A by PCR NEGATIVE NEGATIVE Final   Influenza B by PCR NEGATIVE NEGATIVE Final    Comment: (NOTE) The Xpert Xpress SARS-CoV-2/FLU/RSV plus assay is intended as an aid in the diagnosis of influenza from Nasopharyngeal swab specimens and should not be used as a sole basis for treatment. Nasal washings and aspirates are unacceptable for Xpert Xpress SARS-CoV-2/FLU/RSV testing.  Fact Sheet for Patients: EntrepreneurPulse.com.au  Fact Sheet for Healthcare Providers: IncredibleEmployment.be  This test is not yet approved or cleared by the [place] FDA and has been authorized for detection and/or diagnosis of SARS-CoV-2 by FDA under an Emergency Use Authorization (EUA). This EUA will remain in effect (meaning this test can be used) for the duration of the COVID-19 declaration under Section 564(b)(1) of the Act, 21 U.S.C. section 360bbb-3(b)(1), unless the authorization is terminated or revoked.  Performed at [facility], [address] [place], [place] [address]     Labs: CBC: Recent Labs  Lab 09/23/22 0837 09/24/22 0138  WBC 25.8* 15.8*  HGB 13.7 10.2*  HCT 42.4 31.4*  MCV 95.9 94.9  PLT 365 332   Basic Metabolic Panel: Recent Labs  Lab 09/23/22 0837 09/24/22 0138  NA 141 139  K 3.5 3.9  CL 108 114*  CO2 23 21*  GLUCOSE 154* 103*  BUN 14 14   CREATININE 0.99 0.79  CALCIUM 9.5 8.7*   Liver Function Tests: No results for input(s): "AST", "ALT", "ALKPHOS", "BILITOT", "PROT", "ALBUMIN" in the last 168 hours. CBG: Recent Labs  Lab 09/24/22 [phone]  GLUCAP 106*    Discharge time spent: greater than 30 minutes.  This record has been created using Systems analyst. Errors have been sought and corrected,but may not always be located. Such creation errors do not reflect on the standard of care.   Signed: Lorella Nimrod, MD [facility] 09/24/2022

## 2022-09-24 NOTE — Progress Notes (Signed)
Tech calls and reports that pt's BP is 72/38. Rechecked BP: 75/44. Pt asymptomatic denies dizziness, states that she feels well. Provider B. Randol Kern called. See new orders . Was told to start bolus and recheck vitals in 30 minutes, if BP improved stop bolus and if BP still low continue bolus and recheck vitals . Will continue to monitor call bell within reach.   09/24/22 0435  Assess: MEWS Score  Temp 98.5 F (36.9 C)  BP (!) 72/38  MAP (mmHg) (!) 49  Pulse Rate 72  Resp 18  Level of Consciousness Alert  SpO2 94 %  O2 Device Room Air  Assess: MEWS Score  MEWS Temp 0  MEWS Systolic 2  MEWS Pulse 0  MEWS RR 0  MEWS LOC 0  MEWS Score 2  MEWS Score Color Yellow  Assess: if the MEWS score is Yellow or Red  Were vital signs taken at a resting state? Yes  Focused Assessment No change from prior assessment  Does the patient meet 2 or more of the SIRS criteria? No  MEWS guidelines implemented *See Row Information* Yes  Treat  MEWS Interventions Escalated (See documentation below)  Pain Scale 0-10  Pain Score 0  Take Vital Signs  Increase Vital Sign Frequency  Yellow: Q 2hr X 2 then Q 4hr X 2, if remains yellow, continue Q 4hrs  Escalate  MEWS: Escalate Yellow: discuss with charge nurse/RN and consider discussing with provider and RRT  Notify: Charge Nurse/RN  Date Charge Nurse/RN Notified 09/24/22  Time Charge Nurse/RN Notified 1245  Notify: Provider  Provider Name/Title B.Randol Kern  Date Provider Notified 09/24/22  Time Provider Notified [id number]  Method of Notification Call  Notification Reason Critical result (BP: 75/44)  Provider response See new orders;Evaluate remotely (called)  Date of Provider Response 09/24/22  Time of Provider Response [id number]  Document  Progress note created (see row info) Yes  Assess: SIRS CRITERIA  SIRS Temperature  0  SIRS Pulse 0  SIRS Respirations  0  SIRS WBC 1  SIRS Score Sum  1

## 2022-09-25 ENCOUNTER — Encounter: Payer: Self-pay | Admitting: Nurse Practitioner

## 2022-09-25 LAB — LIPOPROTEIN A (LPA): Lipoprotein (a): 220.5 nmol/L — ABNORMAL HIGH (ref <75.0)

## 2022-09-27 ENCOUNTER — Encounter: Payer: Self-pay | Admitting: Nurse Practitioner

## 2022-09-27 ENCOUNTER — Other Ambulatory Visit: Payer: Self-pay | Admitting: *Deleted

## 2022-09-27 ENCOUNTER — Ambulatory Visit (INDEPENDENT_AMBULATORY_CARE_PROVIDER_SITE_OTHER): Payer: PPO | Admitting: Nurse Practitioner

## 2022-09-27 VITALS — BP 127/82 | HR 105 | Temp 98.5°F | Wt 138.1 lb

## 2022-09-27 DIAGNOSIS — Z09 Encounter for follow-up examination after completed treatment for conditions other than malignant neoplasm: Secondary | ICD-10-CM

## 2022-09-27 DIAGNOSIS — F418 Other specified anxiety disorders: Secondary | ICD-10-CM

## 2022-09-27 DIAGNOSIS — I10 Essential (primary) hypertension: Secondary | ICD-10-CM

## 2022-09-27 DIAGNOSIS — J189 Pneumonia, unspecified organism: Secondary | ICD-10-CM | POA: Diagnosis not present

## 2022-09-27 DIAGNOSIS — I214 Non-ST elevation (NSTEMI) myocardial infarction: Secondary | ICD-10-CM | POA: Diagnosis not present

## 2022-09-27 MED ORDER — ATORVASTATIN CALCIUM 80 MG PO TABS: 80.0000 mg | ORAL_TABLET | Freq: Every evening | ORAL | 1 refills | Status: DC

## 2022-09-27 MED ORDER — DOXYCYCLINE HYCLATE 100 MG PO TABS: 100.0000 mg | ORAL_TABLET | Freq: Two times a day (BID) | ORAL | 0 refills | Status: DC

## 2022-09-27 MED ORDER — BUSPIRONE HCL 5 MG PO TABS: 5.0000 mg | ORAL_TABLET | Freq: Two times a day (BID) | ORAL | 0 refills | Status: DC

## 2022-09-27 NOTE — Assessment & Plan Note (Signed)
New diagnosis in the hospital on 10/19. Has an appt with Cardiology on November 8.  Discussed smoking cessation with patient during visit.  Will increase Atorvastatin to '80mg'$ . Follow up in 1 month.

## 2022-09-27 NOTE — Assessment & Plan Note (Signed)
Chronic. Ongoing.  Will start Buspar '5mg'$  BID.  Side effects and benefits of medication discussed during visit.  Follow up in 1 month.  Call sooner if concerns arise.

## 2022-09-27 NOTE — Assessment & Plan Note (Signed)
Chronic.  Controlled.  Continue with current medication regimen.  Labs ordered today.  Return to clinic in 6 months for reevaluation.  Call sooner if concerns arise.  ? ?

## 2022-09-27 NOTE — Progress Notes (Signed)
BP 127/82   Pulse (!) 105   Temp 98.5 F (36.9 C) (Oral)   Wt 138 lb 1.6 oz (62.6 kg)   LMP  (LMP Unknown)   SpO2 98%   BMI 24.46 kg/m    Subjective:    Patient ID: Valerie Donovan, female    DOB: 04/10/1956, 66 y.o.   MRN: [id number]  HPI: Valerie Donovan is a 66 y.o. female  Chief Complaint  Patient presents with   Hospitalization Follow-up    Patient would like to discuss if she needs to continue doxycycline x pneumonia. Pt reports she was told she had "a touch of pneumonia" and was given abx but not discharged with doxy.    Transition of Care Hospital Follow up.   Hospital/Facility: [facility] D/C Physician: Arnetha Courser, MD D/C Date: 09/24/22  Records Requested: NA Records Received: NA Records Reviewed: Yes  Diagnoses on Discharge:   Principal Problem:   NSTEMI (non-ST elevated myocardial infarction) (HCC) Active Problems:   HLD (hyperlipidemia)   Hypertension   Depression with anxiety   CAP (community acquired pneumonia)   Sepsis (HCC)   COPD (chronic obstructive pulmonary disease) (HCC)   Tobacco abuse  Date of interactive Contact within 48 hours of discharge:  Contact was through:  in person  Date of 7 day or 14 day face-to-face visit:    within 7 days  Outpatient Encounter Medications as of 09/27/2022  Medication Sig   aspirin 81 MG chewable tablet Chew 1 tablet (81 mg total) by mouth daily.   busPIRone (BUSPAR) 5 MG tablet Take 1 tablet (5 mg total) by mouth 2 (two) times daily.   doxycycline (VIBRA-TABS) 100 MG tablet Take 1 tablet (100 mg total) by mouth 2 (two) times daily.   fluticasone (FLONASE) 50 MCG/ACT nasal spray Place 2 sprays into both nostrils daily.   metoprolol succinate (TOPROL-XL) 25 MG 24 hr tablet Take 0.5 tablets (12.5 mg total) by mouth at bedtime.   mirtazapine (REMERON) 15 MG tablet Take 1 tablet (15 mg total) by mouth at bedtime.   omeprazole (PRILOSEC) 20 MG capsule Take 1 capsule (20 mg total) by mouth daily as needed (for  acid reflux/indigestion.).   atorvastatin (LIPITOR) 80 MG tablet Take 1 tablet (80 mg total) by mouth every evening.   EPINEPHrine (EPIPEN 2-PAK) 0.3 mg/0.3 mL IJ SOAJ injection Inject 0.3 mg into the muscle as needed for anaphylaxis.   FLUoxetine (PROZAC) 40 MG capsule TAKE 1 CAPSULE BY MOUTH DAILY   [DISCONTINUED] atorvastatin (LIPITOR) 40 MG tablet Take 1 tablet (40 mg total) by mouth every evening.   [DISCONTINUED] lisinopril (ZESTRIL) 20 MG tablet Hold until you see your cardiologist (Patient not taking: Reported on 09/27/2022)   [DISCONTINUED] Melatonin 10 MG TABS Take 10 mg by mouth at bedtime. (Patient not taking: Reported on 08/19/2022)   [DISCONTINUED] nicotine (NICODERM CQ - DOSED IN MG/24 HOURS) 21 mg/24hr patch Place 1 patch (21 mg total) onto the skin daily. (Patient not taking: Reported on 09/27/2022)   No facility-administered encounter medications on file as of 09/27/2022.    Diagnostic Tests Reviewed/Disposition: Reviewed  Consults: Cardiology  Discharge Instructions: Reviewed with patient  Disease/illness Education: Provided during visit  Home Health/Community Services Discussions/Referrals: Discussed at visit today.  Establishment or re-establishment of referral orders for community resources: NA  Discussion with other health care providers: NA  Assessment and Support of treatment regimen adherence: Provided during visit  Appointments Coordinated with: Patient  Education for self-management, independent living, and ADLs: Provided  today  Patient is working on quitting smoking.  She is going to decrease gradually.  Is concerned about the stress level she will experience with decreasing smoking.  Wondering if there are other medications she can take.   Has Cardiology Appt November 8 for follow up. She is doing well after her NSTEMI.  Still having some SOB.  Was not discharged with any antibiotics. Was receiving Doxycyline in the hospital.    Relevant past medical,  surgical, family and social history reviewed and updated as indicated. Interim medical history since our last visit reviewed. Allergies and medications reviewed and updated.  Review of Systems  Eyes:  Negative for visual disturbance.  Respiratory:  Positive for shortness of breath. Negative for cough and chest tightness.   Cardiovascular:  Negative for chest pain, palpitations and leg swelling.  Neurological:  Negative for dizziness and headaches.  Psychiatric/Behavioral:  The patient is nervous/anxious.     Per HPI unless specifically indicated above     Objective:    BP 127/82   Pulse (!) 105   Temp 98.5 F (36.9 C) (Oral)   Wt 138 lb 1.6 oz (62.6 kg)   LMP  (LMP Unknown)   SpO2 98%   BMI 24.46 kg/m   Wt Readings from Last 3 Encounters:  09/27/22 138 lb 1.6 oz (62.6 kg)  09/23/22 134 lb (60.8 kg)  08/19/22 133 lb (60.3 kg)    Physical Exam Vitals and nursing note reviewed.  Constitutional:      General: She is not in acute distress.    Appearance: Normal appearance. She is normal weight. She is not ill-appearing, toxic-appearing or diaphoretic.  HENT:     Head: Normocephalic.     Right Ear: External ear normal.     Left Ear: External ear normal.     Nose: Nose normal.     Mouth/Throat:     Mouth: Mucous membranes are moist.     Pharynx: Oropharynx is clear.  Eyes:     General:        Right eye: No discharge.        Left eye: No discharge.     Extraocular Movements: Extraocular movements intact.     Conjunctiva/sclera: Conjunctivae normal.     Pupils: Pupils are equal, round, and reactive to light.  Cardiovascular:     Rate and Rhythm: Normal rate and regular rhythm.     Heart sounds: No murmur heard. Pulmonary:     Effort: Pulmonary effort is normal. No respiratory distress.     Breath sounds: Normal breath sounds. No wheezing or rales.  Musculoskeletal:     Cervical back: Normal range of motion and neck supple.  Skin:    General: Skin is warm and dry.      Capillary Refill: Capillary refill takes less than 2 seconds.  Neurological:     General: No focal deficit present.     Mental Status: She is alert and oriented to person, place, and time. Mental status is at baseline.  Psychiatric:        Mood and Affect: Mood normal.        Behavior: Behavior normal.        Thought Content: Thought content normal.        Judgment: Judgment normal.     Results for orders placed or performed during the hospital encounter of 09/23/22  Blood culture (routine x 2)   Specimen: BLOOD  Result Value Ref Range   Specimen Description BLOOD BLOOD RIGHT  ARM    Special Requests      BOTTLES DRAWN AEROBIC AND ANAEROBIC Blood Culture results may not be optimal due to an excessive volume of blood received in culture bottles   Culture      NO GROWTH 4 DAYS Performed at [facility], [address] [place], [place] [address]    Report Status PENDING   Blood culture (routine x 2)   Specimen: BLOOD  Result Value Ref Range   Specimen Description BLOOD BLOOD LEFT ARM    Special Requests      BOTTLES DRAWN AEROBIC AND ANAEROBIC Blood Culture results may not be optimal due to an excessive volume of blood received in culture bottles   Culture      NO GROWTH 3 DAYS Performed at [facility], [address], [place], [place] [address]    Report Status PENDING   Resp Panel by RT-PCR (Flu A&B, Covid) Anterior Nasal Swab   Specimen: Anterior Nasal Swab  Result Value Ref Range   SARS Coronavirus 2 by RT PCR NEGATIVE NEGATIVE   Influenza A by PCR NEGATIVE NEGATIVE   Influenza B by PCR NEGATIVE NEGATIVE  Basic metabolic panel  Result Value Ref Range   Sodium 141 135 - 145 mmol/L   Potassium 3.5 3.5 - 5.1 mmol/L   Chloride 108 98 - 111 mmol/L   CO2 23 22 - 32 mmol/L   Glucose, Bld 154 (H) 70 - 99 mg/dL   BUN 14 8 - 23 mg/dL   Creatinine, Ser 0.99 0.44 - 1.00 mg/dL   Calcium 9.5 8.9 - 10.3 mg/dL   GFR, Estimated >60 >60 mL/min   Anion gap 10 5  - 15  CBC  Result Value Ref Range   WBC 25.8 (H) 4.0 - 10.5 K/uL   RBC 4.42 3.87 - 5.11 MIL/uL   Hemoglobin 13.7 12.0 - 15.0 g/dL   HCT 42.4 36.0 - 46.0 %   MCV 95.9 80.0 - 100.0 fL   MCH 31.0 26.0 - 34.0 pg   MCHC 32.3 30.0 - 36.0 g/dL   RDW 12.4 11.5 - 15.5 %   Platelets 365 150 - 400 K/uL   nRBC 0.0 0.0 - 0.2 %  Lactic acid, plasma  Result Value Ref Range   Lactic Acid, Venous 2.0 (HH) 0.5 - 1.9 mmol/L  APTT  Result Value Ref Range   aPTT 32 24 - 36 seconds  Protime-INR  Result Value Ref Range   Prothrombin Time 13.0 11.4 - 15.2 seconds   INR 1.0 0.8 - 1.2  Procalcitonin  Result Value Ref Range   Procalcitonin <0.10 ng/mL  Hemoglobin A1c  Result Value Ref Range   Hgb A1c MFr Bld 5.7 (H) 4.8 - 5.6 %   Mean Plasma Glucose 116.89 mg/dL  Lipid panel  Result Value Ref Range   Cholesterol 164 0 - 200 mg/dL   Triglycerides 100 <150 mg/dL   HDL 47 >40 mg/dL   Total CHOL/HDL Ratio 3.5 RATIO   VLDL 20 0 - 40 mg/dL   LDL Cholesterol 97 0 - 99 mg/dL  HIV Antibody (routine testing w rflx)  Result Value Ref Range   HIV Screen 4th Generation wRfx Non Reactive Non Reactive  CBC  Result Value Ref Range   WBC 15.8 (H) 4.0 - 10.5 K/uL   RBC 3.31 (L) 3.87 - 5.11 MIL/uL   Hemoglobin 10.2 (L) 12.0 - 15.0 g/dL   HCT 31.4 (L) 36.0 - 46.0 %   MCV 94.9 80.0 -  100.0 fL   MCH 30.8 26.0 - 34.0 pg   MCHC 32.5 30.0 - 36.0 g/dL   RDW 38.5 02.8 - 52.7 %   Platelets 248 150 - 400 K/uL   nRBC 0.0 0.0 - 0.2 %  Basic metabolic panel  Result Value Ref Range   Sodium 139 135 - 145 mmol/L   Potassium 3.9 3.5 - 5.1 mmol/L   Chloride 114 (H) 98 - 111 mmol/L   CO2 21 (L) 22 - 32 mmol/L   Glucose, Bld 103 (H) 70 - 99 mg/dL   BUN 14 8 - 23 mg/dL   Creatinine, Ser 9.58 0.44 - 1.00 mg/dL   Calcium 8.7 (L) 8.9 - 10.3 mg/dL   GFR, Estimated >57 >39 mL/min   Anion gap 4 (L) 5 - 15  Lipoprotein A (LPA)  Result Value Ref Range   Lipoprotein (a) 220.5 (H) <75.0 nmol/L  Lactic acid, plasma  Result  Value Ref Range   Lactic Acid, Venous 0.8 0.5 - 1.9 mmol/L  Lactic acid, plasma  Result Value Ref Range   Lactic Acid, Venous 1.0 0.5 - 1.9 mmol/L  Strep pneumoniae urinary antigen  Result Value Ref Range   Strep Pneumo Urinary Antigen NEGATIVE NEGATIVE  Urine Drug Screen, Qualitative ([facility] only)  Result Value Ref Range   Tricyclic, Ur Screen NONE DETECTED NONE DETECTED   Amphetamines, Ur Screen NONE DETECTED NONE DETECTED   MDMA (Ecstasy)Ur Screen NONE DETECTED NONE DETECTED   Cocaine Metabolite,Ur Ogle NONE DETECTED NONE DETECTED   Opiate, Ur Screen NONE DETECTED NONE DETECTED   Phencyclidine (PCP) Ur S NONE DETECTED NONE DETECTED   Cannabinoid 50 Ng, Ur Chunky POSITIVE (A) NONE DETECTED   Barbiturates, Ur Screen NONE DETECTED NONE DETECTED   Benzodiazepine, Ur Scrn POSITIVE (A) NONE DETECTED   Methadone Scn, Ur NONE DETECTED NONE DETECTED  Glucose, capillary  Result Value Ref Range   Glucose-Capillary 106 (H) 70 - 99 mg/dL  ECHOCARDIOGRAM COMPLETE  Result Value Ref Range   Weight 2,144 oz   Height 63 in   BP 116/64 mmHg   Ao pk vel 1.47 m/s   AV Area VTI 3.68 cm2   AR max vel 3.30 cm2   AV Mean grad 5.0 mmHg   AV Peak grad 8.6 mmHg   S' Lateral 2.80 cm   AV Area mean vel 3.36 cm2   Area-P 1/2 5.62 cm2  Troponin I (High Sensitivity)  Result Value Ref Range   Troponin I (High Sensitivity) 2,523 (HH) <18 ng/L  Troponin I (High Sensitivity)  Result Value Ref Range   Troponin I (High Sensitivity) 2,934 (HH) <18 ng/L      Assessment & Plan:   Problem List Items Addressed This Visit       Cardiovascular and Mediastinum   Hypertension    Chronic.  Controlled.  Continue with current medication regimen.  Labs ordered today.  Return to clinic in 6 months for reevaluation.  Call sooner if concerns arise.        Relevant Medications   atorvastatin (LIPITOR) 80 MG tablet   Non-ST elevation (NSTEMI) myocardial infarction [facility])    New diagnosis in the hospital on 10/19. Has an  appt with Cardiology on November 8.  Discussed smoking cessation with patient during visit.  Will increase Atorvastatin to 80mg . Follow up in 1 month.      Relevant Medications   atorvastatin (LIPITOR) 80 MG tablet     Respiratory   CAP (community acquired pneumonia)  Found in the hospital. Will treat with Doxycyline x 7 days.  Follow up if symptoms not improved.      Relevant Medications   doxycycline (VIBRA-TABS) 100 MG tablet     Other   Depression with anxiety    Chronic. Ongoing.  Will start Buspar $RemoveBefo'5mg'tOfoADnvgPQ$  BID.  Side effects and benefits of medication discussed during visit.  Follow up in 1 month.  Call sooner if concerns arise.      Relevant Medications   busPIRone (BUSPAR) 5 MG tablet   Other Visit Diagnoses     Hospital discharge follow-up    -  Primary   CMP and CBC repeated. Patient doing well.  Has an appt with Cardiology on November 8.   Relevant Orders   CBC w/Diff   Comp Met (CMET)        Follow up plan: Return in about 1 month (around 10/28/2022) for Depression/Anxiety FU.

## 2022-09-27 NOTE — Assessment & Plan Note (Signed)
Found in the hospital. Will treat with Doxycyline x 7 days.  Follow up if symptoms not improved.

## 2022-09-28 LAB — CULTURE, BLOOD (ROUTINE X 2): Culture: NO GROWTH

## 2022-09-29 LAB — COMPREHENSIVE METABOLIC PANEL
ALT: 16 IU/L (ref 0–32)
AST: 16 IU/L (ref 0–40)
Albumin/Globulin Ratio: 1.7 (ref 1.2–2.2)
Albumin: 4.1 g/dL (ref 3.9–4.9)
Alkaline Phosphatase: 109 IU/L (ref 44–121)
BUN/Creatinine Ratio: 12 (ref 12–28)
BUN: 11 mg/dL (ref 8–27)
Bilirubin Total: 0.3 mg/dL (ref 0.0–1.2)
CO2: 19 mmol/L — ABNORMAL LOW (ref 20–29)
Calcium: 9.6 mg/dL (ref 8.7–10.3)
Chloride: 104 mmol/L (ref 96–106)
Creatinine, Ser: 0.9 mg/dL (ref 0.57–1.00)
Globulin, Total: 2.4 g/dL (ref 1.5–4.5)
Glucose: 94 mg/dL (ref 70–99)
Potassium: 3.9 mmol/L (ref 3.5–5.2)
Sodium: 141 mmol/L (ref 134–144)
Total Protein: 6.5 g/dL (ref 6.0–8.5)
eGFR: 70 mL/min/{1.73_m2} (ref >59)

## 2022-09-29 LAB — CBC WITH DIFFERENTIAL/PLATELET
Basophils Absolute: 0.1 10*3/uL (ref 0.0–0.2)
Basos: 0 %
EOS (ABSOLUTE): 0.3 10*3/uL (ref 0.0–0.4)
Eos: 2 %
Hematocrit: 35 % (ref 34.0–46.6)
Hemoglobin: 11.9 g/dL (ref 11.1–15.9)
Immature Grans (Abs): 0.1 10*3/uL (ref 0.0–0.1)
Immature Granulocytes: 1 %
Lymphocytes Absolute: 3.6 10*3/uL — ABNORMAL HIGH (ref 0.7–3.1)
Lymphs: 27 %
MCH: 31.9 pg (ref 26.6–33.0)
MCHC: 34 g/dL (ref 31.5–35.7)
MCV: 94 fL (ref 79–97)
Monocytes Absolute: 1.1 10*3/uL — ABNORMAL HIGH (ref 0.1–0.9)
Monocytes: 8 %
Neutrophils Absolute: 8.3 10*3/uL — ABNORMAL HIGH (ref 1.4–7.0)
Neutrophils: 62 %
Platelets: 356 10*3/uL (ref 150–450)
RBC: 3.73 x10E6/uL — ABNORMAL LOW (ref 3.77–5.28)
RDW: 12 % (ref 11.7–15.4)
WBC: 13.3 10*3/uL — ABNORMAL HIGH (ref 3.4–10.8)

## 2022-09-29 LAB — CULTURE, BLOOD (ROUTINE X 2): Culture: NO GROWTH

## 2022-09-29 NOTE — Progress Notes (Signed)
Hi Pat.  Your lab work is continuing to improve . White blood cell count continues to trend down.  Liver, kidneys and electrolytes look good.  Follow up as discussed.

## 2022-09-30 ENCOUNTER — Ambulatory Visit: Payer: PPO | Admitting: Cardiology

## 2022-10-08 ENCOUNTER — Other Ambulatory Visit: Payer: Self-pay

## 2022-10-08 MED ORDER — FLUOXETINE HCL 40 MG PO CAPS: 40.0000 mg | ORAL_CAPSULE | Freq: Every day | ORAL | 1 refills | Status: DC

## 2022-10-08 NOTE — Telephone Encounter (Signed)
Medication refill request for Fluoxetine 40 MG. Last filled 07/07/2022. Last office visit on 09/27/2022. Next OV scheduled for 11/03/2022. Please advise.

## 2022-10-12 NOTE — Progress Notes (Unsigned)
Cardiology Clinic Note   Patient Name: Valerie Donovan Date of Encounter: 10/13/2022  Primary Care Provider:  Jon Billings, NP Primary Cardiologist:  None  Patient Profile    66 year old female with past medical history of hyperlipidemia, hypertension, history of smoking 1 pack/day, moderate nonobstructive coronary artery disease by recent left heart catheterization, who is here today for follow-up on her nonobstructive coronary artery disease.  Past Medical History    Past Medical History:  Diagnosis Date   Allergy    Anxiety    Arthritis    FINGERS   Cataract    COPD (chronic obstructive pulmonary disease) (HCC)    Depression    GERD (gastroesophageal reflux disease)    Hyperlipidemia    Hypertension    NSTEMI (non-ST elevated myocardial infarction) ([place]) 09/23/2022   Postmenopausal bleeding 2014; 10/202018; 05/14/18   endometrial bx done 09/2015 - nl; nl paps   Past Surgical History:  Procedure Laterality Date   COLONOSCOPY WITH PROPOFOL N/A 07/20/2019   Procedure: COLONOSCOPY WITH PROPOFOL;  Surgeon: Lucilla Lame, MD;  Location: [place];  Service: Endoscopy;  Laterality: N/A;   HYSTEROSCOPY WITH D & C N/A 06/15/2018   Procedure: DILATATION AND CURETTAGE /HYSTEROSCOPY;  Surgeon: Homero Fellers, MD;  Location: [facility];  Service: Gynecology;  Laterality: N/A;   LEFT HEART CATH AND CORONARY ANGIOGRAPHY N/A 09/23/2022   Procedure: LEFT HEART CATH AND CORONARY ANGIOGRAPHY;  Surgeon: Nelva Bush, MD;  Location: [place] [facility];  Service: Cardiovascular;  Laterality: N/A;   POLYPECTOMY  07/20/2019   Procedure: POLYPECTOMY;  Surgeon: Lucilla Lame, MD;  Location: [facility];  Service: Endoscopy;;   TUBAL LIGATION     WISDOM TOOTH EXTRACTION      Allergies  Allergies  Allergen Reactions   Ciprofloxacin Anaphylaxis   Clarithromycin Anaphylaxis   Other Anaphylaxis    ANTIBIOTICS-PT CAN TOLERATE Z-PAC AND DOXYCYCLINE BUT ALL  OTHERS SHE CANNOT   Penicillin G Anaphylaxis    Has patient had a PCN reaction causing immediate rash, facial/tongue/throat swelling, SOB or lightheadedness with hypotension: Yes Has patient had a PCN reaction causing severe rash involving mucus membranes or skin necrosis: No Has patient had a PCN reaction that required hospitalization: No Has patient had a PCN reaction occurring within the last 10 years: No If all of the above answers are "NO", then may proceed with Cephalosporin use.    Codeine Itching    vomiting   Fluoride Preparations Hives   Trazodone Itching    History of Present Illness    Valerie Donovan is a 66 year old female with past medical history of hyperlipidemia, hypertension, history of smoking 1 pack/day, moderate nonobstructive coronary disease by recent left heart catheterization 09/2022.  She presents to the [facility] emergency department 09/23/2022 stating she was in her usual state of health she had been busy working around the house yesterday cleaning gutters moving heavy plants and some mom had some mild shortness of breath during the activity.  Approximately 4 AM she was woken with acute substernal chest pressure radiating up into her jaw.  She reports discomfort with severe and she had associated nausea with vomiting.  Given that her symptoms persisted she arrived to the emergency department for further evaluation.  In the emergency room her initial troponin was 2500, EKG shows sinus tachycardia without ST abnormality.  Started on heparin drip and proceed with left heart catheterization and echocardiogram.  She was then subsequently discharged from the hospital on 09/24/2022.  Echocardiogram completed revealed LVEF 30-35%, left ventricle is moderately decreased function, left ventricle demonstrates global hypokinesis with preserved wall motion of the basal regions suggestive of stress cardiomyopathy, G1 DD, mild mitral regurgitation, and moderate tricuspid  regurgitation.  Left heart catheterization revealed moderate nonobstructive coronary artery disease with 40% proximal LAD and mid RCA stenosis.  Severely reduced left ventricular systolic function with an LVEF of 25-35% with mild to apical akinesis and preserved basal function consistent with Takotsubo cardiomyopathy, mildly elevated LVEDP.  Recommendations was gentle diuresis and today shows initiation of goal-directed medical therapy as blood pressure and renal function would allow along with medical therapy and risk factor modification to prevent progressive addition of a moderate nonobstructive coronary artery disease.  She returns to clinic today accompanied by her sister stating that she has been doing well.  She stated that when she had first returned home from the hospital she had noted that she had some shortness of breath that has gradually improved since she has been more active at home.  Today she denies any chest pain, shortness of breath, peripheral edema, palpitations.  She states she continues to have small amount of fatigue but that is gradually improved as well.  She is concerned today as she is no longer on any of her blood pressure medication as previously she had been on lisinopril 20 mg daily.  She has been tolerating her other medications as well.  Denies any repeat hospitalizations or visits to the emergency department since her discharge.  Home Medications    Current Outpatient Medications  Medication Sig Dispense Refill   lisinopril (ZESTRIL) 5 MG tablet Take 1 tablet (5 mg total) by mouth daily. 30 tablet 3   aspirin 81 MG chewable tablet Chew 1 tablet (81 mg total) by mouth daily. 90 tablet 1   atorvastatin (LIPITOR) 80 MG tablet Take 1 tablet (80 mg total) by mouth every evening. 90 tablet 1   busPIRone (BUSPAR) 5 MG tablet Take 1 tablet (5 mg total) by mouth 2 (two) times daily. 120 tablet 0   doxycycline (VIBRA-TABS) 100 MG tablet Take 1 tablet (100 mg total) by mouth 2  (two) times daily. 14 tablet 0   EPINEPHrine (EPIPEN 2-PAK) 0.3 mg/0.3 mL IJ SOAJ injection Inject 0.3 mg into the muscle as needed for anaphylaxis. 2 each 1   FLUoxetine (PROZAC) 40 MG capsule Take 1 capsule (40 mg total) by mouth daily. 90 capsule 1   fluticasone (FLONASE) 50 MCG/ACT nasal spray Place 2 sprays into both nostrils daily. 16 g 6   metoprolol succinate (TOPROL-XL) 25 MG 24 hr tablet Take 0.5 tablets (12.5 mg total) by mouth at bedtime. 30 tablet 1   mirtazapine (REMERON) 15 MG tablet Take 1 tablet (15 mg total) by mouth at bedtime. 90 tablet 1   omeprazole (PRILOSEC) 20 MG capsule Take 1 capsule (20 mg total) by mouth daily as needed (for acid reflux/indigestion.). 90 capsule 1   No current facility-administered medications for this visit.     Family History    Family History  Problem Relation Age of Onset   Cancer Mother    Diabetes Mother    Early death Mother    Cancer Father        colon   Arthritis Father    Diabetes Sister    Diabetes Sister    Diabetes Maternal Grandmother    Diabetes Maternal Grandfather    Breast cancer Neg Hx    She indicated that her mother is deceased.  She indicated that her father is deceased. She indicated that both of her sisters are alive. She indicated that her maternal grandmother is deceased. She indicated that her maternal grandfather is deceased. She indicated that her paternal grandmother is deceased. She indicated that her paternal grandfather is deceased. She indicated that both of her sons are alive. She indicated that the status of her neg hx is unknown.  Social History    Social History   Socioeconomic History   Marital status: Divorced    Spouse name: Not on file   Number of children: Not on file   Years of education: Not on file   Highest education level: Not on file  Occupational History   Occupation: retired  Tobacco Use   Smoking status: Every Day    Packs/day: 0.50    Years: 44.00    Total pack years: 22.00     Types: Cigarettes   Smokeless tobacco: Never  Vaping Use   Vaping Use: Former  Substance and Sexual Activity   Alcohol use: No   Drug use: No    Comment: CBD OIL IN [place]   Sexual activity: Not Currently    Birth control/protection: Post-menopausal  Other Topics Concern   Not on file  Social History Narrative   Not on file   Social Determinants of Health   Financial Resource Strain: Low Risk  (12/30/2021)   Overall Financial Resource Strain (CARDIA)    Difficulty of Paying Living Expenses: Not very hard  Food Insecurity: No Food Insecurity (09/23/2022)   Hunger Vital Sign    Worried About Running Out of Food in the Last Year: Never true    Ran Out of Food in the Last Year: Never true  Transportation Needs: No Transportation Needs (09/23/2022)   PRAPARE - Hydrologist (Medical): No    Lack of Transportation (Non-Medical): No  Physical Activity: Sufficiently Active (12/30/2021)   Exercise Vital Sign    Days of Exercise per Week: 4 days    Minutes of Exercise per Session: 50 min  Stress: No Stress Concern Present (12/30/2021)   [place]    Feeling of Stress : Only a little  Social Connections: Socially Isolated (12/30/2021)   Social Connection and Isolation Panel [NHANES]    Frequency of Communication with Friends and Family: More than three times a week    Frequency of Social Gatherings with Friends and Family: More than three times a week    Attends Religious Services: Never    Marine scientist or Organizations: No    Attends Archivist Meetings: Never    Marital Status: Divorced  Human resources officer Violence: Not At Risk (09/23/2022)   Humiliation, Afraid, Rape, and Kick questionnaire    Fear of Current or Ex-Partner: No    Emotionally Abused: No    Physically Abused: No    Sexually Abused: No     Review of Systems    General:  No chills, fever, night sweats  or weight changes.  Occasional exertional fatigue Cardiovascular:  No chest pain, dyspnea on exertion, edema, orthopnea, palpitations, paroxysmal nocturnal dyspnea. Dermatological: No rash, lesions/masses Respiratory: No cough, dyspnea Urologic: No hematuria, dysuria Abdominal:   No nausea, vomiting, diarrhea, bright red blood per rectum, melena, or hematemesis Neurologic:  No visual changes, wkns, changes in mental status. All other systems reviewed and are otherwise negative except as noted above.   Cardiac Rehabilitation Eligibility Assessment  Physical Exam    VS:  BP 128/70 (BP Location: Left Arm, Patient Position: Sitting, Cuff Size: Normal)   Pulse 65   Ht '5\' 2"'$  (1.575 m)   Wt 138 lb 4 oz (62.7 kg)   LMP  (LMP Unknown)   SpO2 97%   BMI 25.29 kg/m  , BMI Body mass index is 25.29 kg/m.     GEN: Well nourished, well developed, in no acute distress. HEENT: normal.  Glasses on Neck: Supple, no JVD, carotid bruits, or masses. Cardiac: RRR, no murmurs, rubs, or gallops. No clubbing, cyanosis, edema.  Radials/DP/PT 2+ and equal bilaterally.  Respiratory:  Respirations regular and unlabored, clear to auscultation bilaterally. GI: Soft, nontender, nondistended, BS + x 4. MS: no deformity or atrophy. Skin: warm and dry, no rash. Neuro:  Strength and sensation are intact. Psych: Normal affect.  Accessory Clinical Findings    ECG personally reviewed by me today-EKG completed today showed arm lead reversal but she is sinus at a rate of 65- No acute changes  Lab Results  Component Value Date   WBC 13.3 (H) 09/27/2022   HGB 11.9 09/27/2022   HCT 35.0 09/27/2022   MCV 94 09/27/2022   PLT 356 09/27/2022   Lab Results  Component Value Date   CREATININE 0.90 09/27/2022   BUN 11 09/27/2022   NA 141 09/27/2022   K 3.9 09/27/2022   CL 104 09/27/2022   CO2 19 (L) 09/27/2022   Lab Results  Component Value Date   ALT 16 09/27/2022   AST 16 09/27/2022   ALKPHOS 109  09/27/2022   BILITOT 0.3 09/27/2022   Lab Results  Component Value Date   CHOL 164 09/23/2022   HDL 47 09/23/2022   LDLCALC 97 09/23/2022   TRIG 100 09/23/2022   CHOLHDL 3.5 09/23/2022    Lab Results  Component Value Date   HGBA1C 5.7 (H) 09/23/2022    Assessment & Plan   1.  Recent NSTEMI with moderate nonobstructive disease consistent with Takotsubo event found on left heart catheterization completed 09/23/2022.  There is 40% proximal LAD and mid RCA stenosis.  Severely reduced left ventricular systolic function (LVEF 54-65%, with mid and apical akinesis with preserved basal function consistent with Takotsubo cardiomyopathy.  She also had mildly elevated left ventricular filling pressures of 20 mmHg.  She remains chest pain-free today.  She has been continued on aspirin 81 mg daily atorvastatin 80 mg daily, Toprol-XL 12.5 mg at bedtime, and will restart her today on lisinopril 5 mg daily.  She is also being scheduled for cardiac rehab.  2.  Takotsubo cardiomyopathy with LVEF of 25-35% found on the left heart catheterization.  Echocardiogram revealed LVEF 30-35%, with the left ventricle is moderately decreased function, global hypokinesis with preserved wall motion of the basal region suggestive of stress cardiomyopathy.  There was mild mitral regurgitation and moderate tricuspid regurgitation, aortic valve sclerosis without stenosis.  She has been continued on Toprol XL 12.5 mg daily and restarted on lisinopril 5 mg today.  Blood pressure medications had previously been stopped during her hospitalization we were unable to escalate GDMT due to hypotension.  She had previously been on lisinopril 20 mg daily.  She has several known allergies to medications and prefers to stay on the medication she is familiar with so lisinopril was restarted at a reduced dose to add ACE for her GDMT.  We will continue to escalate GDMT as tolerated with blood pressure and she is scheduled for limited echocardiogram  in 3 months to reevaluate LVEF.  3.  Hypertension with blood pressure today 128/70.  She is continued on Toprol-XL 12.5 mg daily and started on lisinopril 5 mg daily.  4.  Hyperlipidemia where atorvastatin was increased to 80 mg daily.  She will need repeat lipid and hepatic panel in 6 to 8 weeks.  5.  Tobacco abuse with continued smoking.  Patient is advised total cessation is recommendation.  At this time she is not interested in stopping smoking as she has been smoking for 40+ years.  6.  Patient return to clinic to see MD/APP in 6 weeks or sooner if needed.  Jaya Lapka, NP 10/13/2022, 9:21 AM

## 2022-10-13 ENCOUNTER — Ambulatory Visit: Payer: PPO | Admitting: Cardiology

## 2022-10-13 ENCOUNTER — Ambulatory Visit: Payer: PPO | Attending: Cardiology | Admitting: Cardiology

## 2022-10-13 ENCOUNTER — Encounter: Payer: Self-pay | Admitting: Cardiology

## 2022-10-13 VITALS — BP 128/70 | HR 65 | Ht 62.0 in | Wt 138.2 lb

## 2022-10-13 DIAGNOSIS — I2510 Atherosclerotic heart disease of native coronary artery without angina pectoris: Secondary | ICD-10-CM

## 2022-10-13 DIAGNOSIS — I10 Essential (primary) hypertension: Secondary | ICD-10-CM

## 2022-10-13 DIAGNOSIS — E7800 Pure hypercholesterolemia, unspecified: Secondary | ICD-10-CM | POA: Diagnosis not present

## 2022-10-13 DIAGNOSIS — I214 Non-ST elevation (NSTEMI) myocardial infarction: Secondary | ICD-10-CM

## 2022-10-13 DIAGNOSIS — Z720 Tobacco use: Secondary | ICD-10-CM

## 2022-10-13 DIAGNOSIS — I5181 Takotsubo syndrome: Secondary | ICD-10-CM | POA: Diagnosis not present

## 2022-10-13 MED ORDER — LISINOPRIL 5 MG PO TABS: 5.0000 mg | ORAL_TABLET | Freq: Every day | ORAL | 3 refills | Status: DC

## 2022-10-13 NOTE — Patient Instructions (Signed)
Medication Instructions:   Your physician has recommended you make the following change in your medication:    START taking Lisinopril 5 MG once a day.  *If you need a refill on your cardiac medications before your next appointment, please call your pharmacy*   Lab Work:  Your physician recommends that you return for lab work (Hepatic Panel) in: 6-8 weeks  - Please go to the [facility] You will check in at the front desk to the right as you walk into the atrium. Valet Parking is offered if needed. - No appointment needed. You may go any day between 7 am and 6 pm.     Testing/Procedures:  Your physician has requested that you have an echocardiogram in 3 months. Echocardiography is a painless test that uses sound waves to create images of your heart. It provides your doctor with information about the size and shape of your heart and how well your heart's chambers and valves are working. This procedure takes approximately one hour. There are no restrictions for this procedure. Please do NOT wear cologne, perfume, aftershave, or lotions (deodorant is allowed). Please arrive 15 minutes prior to your appointment time.    Follow-Up: At [facility], you and your health needs are our priority.  As part of our continuing mission to provide you with exceptional heart care, we have created designated Provider Care Teams.  These Care Teams include your primary Cardiologist (physician) and Advanced Practice Providers (APPs -  Physician Assistants and Nurse Practitioners) who all work together to provide you with the care you need, when you need it.  We recommend signing up for the patient portal called "MyChart".  Sign up information is provided on this After Visit Summary.  MyChart is used to connect with patients for Virtual Visits (Telemedicine).  Patients are able to view lab/test results, encounter notes, upcoming appointments, etc.  Non-urgent messages can be sent to your  provider as well.   To learn more about what you can do with MyChart, go to [email or web address].    Your next appointment:   6-8 weeks   The format for your next appointment:   In Person  Provider:    Gerrie Nordmann, NP    Other Instructions   Important Information About Sugar

## 2022-10-25 ENCOUNTER — Other Ambulatory Visit: Payer: Self-pay | Admitting: Nurse Practitioner

## 2022-10-25 ENCOUNTER — Other Ambulatory Visit
Admission: RE | Admit: 2022-10-25 | Discharge: 2022-10-25 | Disposition: A | Discharge status: Home / Self Care | Payer: PPO | Attending: Cardiology | Admitting: Cardiology

## 2022-10-25 DIAGNOSIS — I10 Essential (primary) hypertension: Secondary | ICD-10-CM | POA: Diagnosis not present

## 2022-10-25 LAB — HEPATIC FUNCTION PANEL
ALT: 15 U/L (ref 0–44)
AST: 19 U/L (ref 15–41)
Albumin: 3.9 g/dL (ref 3.5–5.0)
Alkaline Phosphatase: 115 U/L (ref 38–126)
Bilirubin, Direct: <0.1 mg/dL (ref 0.0–0.2)
Total Bilirubin: 0.7 mg/dL (ref 0.3–1.2)
Total Protein: 7.1 g/dL (ref 6.5–8.1)

## 2022-10-25 NOTE — Telephone Encounter (Signed)
Requested Prescriptions  Pending Prescriptions Disp Refills   mirtazapine (REMERON) 15 MG tablet [Pharmacy Med Name: MIRTAZAPINE '15MG'$  TABLETS] 90 tablet 0    Sig: TAKE 1 TABLET(15 MG) BY MOUTH AT BEDTIME     Psychiatry: Antidepressants - mirtazapine Passed - 10/25/2022 10:15 AM      Passed - Completed PHQ-2 or PHQ-9 in the last 360 days      Passed - Valid encounter within last 6 months    Recent Outpatient Visits           4 weeks ago Hospital discharge follow-up   [facility] Jon Billings, NP   2 months ago Annual physical exam   [place], NP   7 months ago Sleep disorder   [facility] Jon Billings, NP   8 months ago Primary hypertension   [facility] Jon Billings, NP   1 year ago Annual physical exam   [facility] Jon Billings, NP       Future Appointments             In 1 week Jon Billings, NP [employer]   In 1 month Hammock, Barbera Setters, NP [employer] [facility] [place] [place]

## 2022-10-25 NOTE — Progress Notes (Signed)
Stable liver function. No changes needed to be made to medications at this time.

## 2022-11-02 NOTE — Progress Notes (Unsigned)
   LMP  (LMP Unknown)    Subjective:    Patient ID: Valerie Donovan, female    DOB: 19-Mar-1956, 66 y.o.   MRN: [id number]  HPI: Valerie Donovan is a 66 y.o. female  No chief complaint on file.  MOOD   Relevant past medical, surgical, family and social history reviewed and updated as indicated. Interim medical history since our last visit reviewed. Allergies and medications reviewed and updated.  Review of Systems  Per HPI unless specifically indicated above     Objective:    LMP  (LMP Unknown)   Wt Readings from Last 3 Encounters:  10/13/22 138 lb 4 oz (62.7 kg)  09/27/22 138 lb 1.6 oz (62.6 kg)  09/23/22 134 lb (60.8 kg)    Physical Exam  Results for orders placed or performed during the hospital encounter of 10/25/22  Hepatic function panel  Result Value Ref Range   Total Protein 7.1 6.5 - 8.1 g/dL   Albumin 3.9 3.5 - 5.0 g/dL   AST 19 15 - 41 U/L   ALT 15 0 - 44 U/L   Alkaline Phosphatase 115 38 - 126 U/L   Total Bilirubin 0.7 0.3 - 1.2 mg/dL   Bilirubin, Direct <0.1 0.0 - 0.2 mg/dL   Indirect Bilirubin NOT CALCULATED 0.3 - 0.9 mg/dL      Assessment & Plan:   Problem List Items Addressed This Visit       Other   Depression with anxiety - Primary     Follow up plan: No follow-ups on file.

## 2022-11-03 ENCOUNTER — Encounter: Payer: Self-pay | Admitting: Nurse Practitioner

## 2022-11-03 ENCOUNTER — Ambulatory Visit (INDEPENDENT_AMBULATORY_CARE_PROVIDER_SITE_OTHER): Payer: PPO | Admitting: Nurse Practitioner

## 2022-11-03 VITALS — BP 112/65 | HR 58 | Temp 98.5°F | Ht 62.0 in | Wt 135.7 lb

## 2022-11-03 DIAGNOSIS — J189 Pneumonia, unspecified organism: Secondary | ICD-10-CM

## 2022-11-03 DIAGNOSIS — F418 Other specified anxiety disorders: Secondary | ICD-10-CM | POA: Diagnosis not present

## 2022-11-03 MED ORDER — BUSPIRONE HCL 5 MG PO TABS: 5.0000 mg | ORAL_TABLET | Freq: Two times a day (BID) | ORAL | 1 refills | Status: DC

## 2022-11-03 NOTE — Assessment & Plan Note (Signed)
Chronic.  Controlled.  Continue with current medication regimen of Prozac '40mg'$  and Buspar '5mg'$  BID.  Labs ordered today.  Return to clinic in 6 months for reevaluation.  Call sooner if concerns arise.

## 2022-11-03 NOTE — Assessment & Plan Note (Signed)
Improved.  Will recheck CBC to ensure it has returned to normal.

## 2022-11-04 LAB — CBC WITH DIFFERENTIAL/PLATELET
Basophils Absolute: 0 10*3/uL (ref 0.0–0.2)
Basos: 0 %
EOS (ABSOLUTE): 0.4 10*3/uL (ref 0.0–0.4)
Eos: 4 %
Hematocrit: 37.7 % (ref 34.0–46.6)
Hemoglobin: 12.6 g/dL (ref 11.1–15.9)
Immature Grans (Abs): 0 10*3/uL (ref 0.0–0.1)
Immature Granulocytes: 0 %
Lymphocytes Absolute: 2.9 10*3/uL (ref 0.7–3.1)
Lymphs: 32 %
MCH: 31.8 pg (ref 26.6–33.0)
MCHC: 33.4 g/dL (ref 31.5–35.7)
MCV: 95 fL (ref 79–97)
Monocytes Absolute: 0.6 10*3/uL (ref 0.1–0.9)
Monocytes: 6 %
Neutrophils Absolute: 5.2 10*3/uL (ref 1.4–7.0)
Neutrophils: 58 %
Platelets: 273 10*3/uL (ref 150–450)
RBC: 3.96 x10E6/uL (ref 3.77–5.28)
RDW: 12 % (ref 11.7–15.4)
WBC: 9.1 10*3/uL (ref 3.4–10.8)

## 2022-11-04 NOTE — Progress Notes (Signed)
HI Ms. Valerie Donovan.  Your complete blood count returned to normal which is great news.  We will monitor it in the future.

## 2022-11-11 ENCOUNTER — Emergency Department
Admission: EM | Admit: 2022-11-11 | Discharge: 2022-11-11 | Disposition: A | Discharge status: Home / Self Care | Payer: PPO | Attending: Emergency Medicine | Admitting: Emergency Medicine

## 2022-11-11 ENCOUNTER — Encounter: Payer: Self-pay | Admitting: Emergency Medicine

## 2022-11-11 ENCOUNTER — Emergency Department: Payer: PPO

## 2022-11-11 ENCOUNTER — Other Ambulatory Visit: Payer: Self-pay

## 2022-11-11 DIAGNOSIS — J449 Chronic obstructive pulmonary disease, unspecified: Secondary | ICD-10-CM | POA: Diagnosis not present

## 2022-11-11 DIAGNOSIS — R0789 Other chest pain: Secondary | ICD-10-CM | POA: Diagnosis not present

## 2022-11-11 DIAGNOSIS — I10 Essential (primary) hypertension: Secondary | ICD-10-CM | POA: Diagnosis not present

## 2022-11-11 DIAGNOSIS — F32A Depression, unspecified: Secondary | ICD-10-CM | POA: Diagnosis not present

## 2022-11-11 DIAGNOSIS — R079 Chest pain, unspecified: Secondary | ICD-10-CM

## 2022-11-11 DIAGNOSIS — D72829 Elevated white blood cell count, unspecified: Secondary | ICD-10-CM | POA: Insufficient documentation

## 2022-11-11 LAB — BASIC METABOLIC PANEL
Anion gap: 7 (ref 5–15)
BUN: 12 mg/dL (ref 8–23)
CO2: 21 mmol/L — ABNORMAL LOW (ref 22–32)
Calcium: 8.8 mg/dL — ABNORMAL LOW (ref 8.9–10.3)
Chloride: 107 mmol/L (ref 98–111)
Creatinine, Ser: 0.74 mg/dL (ref 0.44–1.00)
GFR, Estimated: >60 mL/min (ref >60)
Glucose, Bld: 122 mg/dL — ABNORMAL HIGH (ref 70–99)
Potassium: 3.8 mmol/L (ref 3.5–5.1)
Sodium: 135 mmol/L (ref 135–145)

## 2022-11-11 LAB — CBC
HCT: 40.2 % (ref 36.0–46.0)
Hemoglobin: 13 g/dL (ref 12.0–15.0)
MCH: 30.7 pg (ref 26.0–34.0)
MCHC: 32.3 g/dL (ref 30.0–36.0)
MCV: 94.8 fL (ref 80.0–100.0)
Platelets: 328 10*3/uL (ref 150–400)
RBC: 4.24 MIL/uL (ref 3.87–5.11)
RDW: 12.1 % (ref 11.5–15.5)
WBC: 18.1 10*3/uL — ABNORMAL HIGH (ref 4.0–10.5)
nRBC: 0 % (ref 0.0–0.2)

## 2022-11-11 LAB — TROPONIN I (HIGH SENSITIVITY)
Troponin I (High Sensitivity): 11 ng/L (ref <18)
Troponin I (High Sensitivity): 11 ng/L (ref <18)

## 2022-11-11 NOTE — ED Provider Notes (Signed)
[facility] Provider Note    Event Date/Time   First MD Initiated Contact with Patient 11/11/22 1322     (approximate)  History   Chief Complaint: Chest Pain  HPI  Valerie Donovan is a 66 y.o. female the past medical history of anxiety, COPD, gastric reflux, hypertension, hyperlipidemia, presents to the emergency department for chest pain.  According to the patient she had an NSTEMI in October.  She states around 5 AM today she had pain in the center of her chest that seem to go somewhat to her jaw.  Patient was concerned given her recent heart attack so she came to the emergency department for evaluation.  Here the patient appears well denies any symptoms denies any chest pain or shortness of breath.  No nausea or diaphoresis at any point.  Physical Exam   Triage Vital Signs: ED Triage Vitals  Enc Vitals Group     BP 11/11/22 1054 121/84     Pulse Rate 11/11/22 1054 79     Resp 11/11/22 1054 20     Temp 11/11/22 1054 99.4 F (37.4 C)     Temp Source 11/11/22 1054 Oral     SpO2 11/11/22 1054 96 %     Weight 11/11/22 1052 130 lb (59 kg)     Height 11/11/22 1052 '5\' 3"'$  (1.6 m)     Head Circumference --      Peak Flow --      Pain Score 11/11/22 1052 5     Pain Loc --      Pain Edu? --      Excl. in [place]? --     Most recent vital signs: Vitals:   11/11/22 1054 11/11/22 1316  BP: 121/84 134/65  Pulse: 79 79  Resp: 20 (!) 22  Temp: 99.4 F (37.4 C)   SpO2: 96% 96%    General: Awake, no distress.  CV:  Good peripheral perfusion.  Regular rate and rhythm  Resp:  Normal effort.  Equal breath sounds bilaterally.  Abd:  No distention.  Soft, nontender.  No rebound or guarding. Other:  No lower extremity edema   ED Results / Procedures / Treatments   EKG  EKG viewed and interpreted by myself shows a normal sinus rhythm 81 bpm with a narrow QRS, normal axis, normal intervals, nonspecific ST changes.  Patient does appear to have T wave inversions  in the inferolateral leads.  Unchanged from prior EKG.  RADIOLOGY  Reviewed and interpreted the chest x-ray images.  I do not see any obvious consolidation on my evaluation. Radiology has read a persistent opacity in left lung base but unchanged.   MEDICATIONS ORDERED IN ED: Medications - No data to display   IMPRESSION / MDM / [place] / ED COURSE  I reviewed the triage vital signs and the nursing notes.  Patient's presentation is most consistent with acute presentation with potential threat to life or bodily function.  Patient presents emergency department for chest pain that resolved prior to arrival to the emergency department.  Here the patient appears well, no distress.  Reassuring workup including reassuring CBC besides some leukocytosis.  Chemistry is normal.  Troponin is negative now x 2.  Chest x-ray is unchanged and EKG shows no significant findings unchanged from prior EKG.  As the patient is chest pain-free with 2 negative troponins I believe the patient will be safe for discharge home with outpatient follow-up with her cardiologist.  I discussed this  with the patient who is agreeable to this plan of care.  FINAL CLINICAL IMPRESSION(S) / ED DIAGNOSES   Chest pain    Note:  This document was prepared using Dragon voice recognition software and may include unintentional dictation errors.   Harvest Dark, MD 11/11/22 1423

## 2022-11-11 NOTE — ED Triage Notes (Signed)
Pt via EMS from home. Pt c/o centralized chest pain that radiates up the L side of her jaw. Pt has a hx of a NSTEMI in October. Pt was at her PCP office and they gave her 324 ASA there and states that the pain has gotten better. Pt is A&OX4 and NAD

## 2022-11-16 ENCOUNTER — Encounter: Payer: Self-pay | Admitting: Cardiology

## 2022-11-16 ENCOUNTER — Ambulatory Visit: Payer: PPO | Attending: Cardiology | Admitting: Cardiology

## 2022-11-16 VITALS — BP 128/72 | HR 69 | Ht 62.6 in | Wt 136.8 lb

## 2022-11-16 DIAGNOSIS — I5181 Takotsubo syndrome: Secondary | ICD-10-CM | POA: Diagnosis not present

## 2022-11-16 DIAGNOSIS — I10 Essential (primary) hypertension: Secondary | ICD-10-CM

## 2022-11-16 DIAGNOSIS — Z720 Tobacco use: Secondary | ICD-10-CM

## 2022-11-16 DIAGNOSIS — E7800 Pure hypercholesterolemia, unspecified: Secondary | ICD-10-CM

## 2022-11-16 DIAGNOSIS — I214 Non-ST elevation (NSTEMI) myocardial infarction: Secondary | ICD-10-CM

## 2022-11-16 DIAGNOSIS — I2510 Atherosclerotic heart disease of native coronary artery without angina pectoris: Secondary | ICD-10-CM

## 2022-11-16 MED ORDER — ISOSORBIDE MONONITRATE ER 30 MG PO TB24: 15.0000 mg | ORAL_TABLET | Freq: Every day | ORAL | 1 refills | Status: DC

## 2022-11-16 MED ORDER — NITROGLYCERIN 0.4 MG SL SUBL: 0.4000 mg | SUBLINGUAL_TABLET | SUBLINGUAL | 3 refills | Status: DC | PRN

## 2022-11-16 NOTE — Patient Instructions (Signed)
Medication Instructions:  - Your physician has recommended you make the following change in your medication:   1) START Imdur (Isosorbide) 30 mg: - take 0.5 tablet (15 mg) by mouth once daily  2) START Nitroglycerin 0.4 mg sublingual tablets: (as needed) If you experience chest pain: Place 1 nitroglycerin under your tongue, while sitting. If no relief of pain, may repeat 1 tablet every 5 minutes up to 3 tablets over 15 minutes. If no relief call 911. If you have dizziness/lightheadedness while taking nitroglycerin, stop taking and call 911.    *If you need a refill on your cardiac medications before your next appointment, please call your pharmacy*   Lab Work: - none ordered  If you have labs (blood work) drawn today and your tests are completely normal, you will receive your results only by: [place] (if you have MyChart) OR A paper copy in the mail If you have any lab test that is abnormal or we need to change your treatment, we will call you to review the results.   Testing/Procedures: - Echocardiogram: Can this be moved up to end of January (or after January 19?)   Follow-Up: At [facility], you and your health needs are our priority.  As part of our continuing mission to provide you with exceptional heart care, we have created designated Provider Care Teams.  These Care Teams include your primary Cardiologist (physician) and Advanced Practice Providers (APPs -  Physician Assistants and Nurse Practitioners) who all work together to provide you with the care you need, when you need it.  We recommend signing up for the patient portal called "MyChart".  Sign up information is provided on this After Visit Summary.  MyChart is used to connect with patients for Virtual Visits (Telemedicine).  Patients are able to view lab/test results, encounter notes, upcoming appointments, etc.  Non-urgent messages can be sent to your provider as well.   To learn more about what you can  do with MyChart, go to [email or web address].    Your next appointment:   After the Echocardiogram is completed   The format for your next appointment:   In Person  Provider:   You may see Ida Rogue, MD  or one of the following Advanced Practice Providers on your designated Care Team:    Gerrie Nordmann, NP    Other Instructions Isosorbide Mononitrate Extended-Release Tablets What is this medication? ISOSORBIDE MONONITRATE (eye soe SOR bide mon oh NYE trate) prevents chest pain (angina). It works by relaxing blood vessels, which decreases the amount of work the heart has to do. It belongs to a group of medications called nitrates. Do not use it to treat sudden chest pain. This medicine may be used for other purposes; ask your health care provider or pharmacist if you have questions. COMMON BRAND NAME(S): Imdur, Isotrate ER What should I tell my care team before I take this medication? They need to know if you have any of these conditions: Previous heart attack or heart failure An unusual or allergic reaction to isosorbide mononitrate, nitrates, other medications, foods, dyes, or preservatives Pregnant or trying to get pregnant Breast-feeding How should I use this medication? Take this medication by mouth with a glass of water. Follow the directions on the prescription label. Do not crush or chew. Take your medication at regular intervals. Do not take your medication more often than directed. Do not stop taking this medication except on the advice of your care team. Talk to your care team about  the use of this medication in children. Special care may be needed. Overdosage: If you think you have taken too much of this medicine contact a poison control center or emergency room at once. NOTE: This medicine is only for you. Do not share this medicine with others. What if I miss a dose? If you miss a dose, take it as soon as you can. If it is almost time for your next dose, take only  that dose. Do not take double or extra doses. What may interact with this medication? Do not take this medication with any of the following: Certain medications for erectile dysfunction (ED), such as avanafil, sildenafil, tadalafil, or vardenafil Riociguat This medication may also interact with the following: Medications for high blood pressure Other medications for angina or heart failure This list may not describe all possible interactions. Give your health care provider a list of all the medicines, herbs, non-prescription drugs, or dietary supplements you use. Also tell them if you smoke, drink alcohol, or use illegal drugs. Some items may interact with your medicine. What should I watch for while using this medication? Visit your care team for regular checks on your progress. Check your heart rate and blood pressure as directed. Know what your heart rate and blood pressure should be and when to contact your care team. Tell your care team if you feel your medication is no longer working. This medication may affect your coordination, reaction time, or judgment. Do not drive or operate machinery until you know how this medication affects you. Sit up or stand slowly to reduce the risk of dizzy or fainting spells. Drinking alcohol with this medication can increase the risk of these side effects Do not treat yourself for coughs, colds, or pain while you are taking this medication without asking your care team for advice. Some medications may increase your blood pressure. What side effects may I notice from receiving this medication? Side effects that you should report to your care team as soon as possible: Allergic reactions--skin rash, itching, hives, swelling of the face, lips, tongue, or throat Headache, unusual weakness or fatigue, shortness of breath, nausea, vomiting, rapid heartbeat, blue skin or lips, which may be signs of methemoglobinemia Increased pressure around the brain--severe headache,  blurry vision, change in vision, nausea, vomiting Low blood pressure--dizziness, feeling faint or lightheaded, blurry vision Slow heartbeat--dizziness, feeling faint or lightheaded, confusion, trouble breathing, unusual weakness or fatigue Worsening chest pain (angina)--pain, pressure, or tightness in the chest, neck, back, or arms Side effects that usually do not require medical attention (report to your care team if they continue or are bothersome): Dizziness Flushing Headache This list may not describe all possible side effects. Call your doctor for medical advice about side effects. You may report side effects to FDA at 1-800-FDA-1088. Where should I keep my medication? Keep out of the reach of children. Store between 15 and 30 degrees C (59 and 86 degrees F). Keep container tightly closed. Throw away any unused medication after the expiration date. NOTE: This sheet is a summary. It may not cover all possible information. If you have questions about this medicine, talk to your doctor, pharmacist, or health care provider.  2023 Elsevier/Gold Standard (2021-03-09 00:00:00)   Important Information About Sugar

## 2022-11-16 NOTE — Progress Notes (Unsigned)
Cardiology Clinic Note   Patient Name: Valerie Donovan Date of Encounter: 11/16/2022  Primary Care Provider:  Jon Billings, NP Primary Cardiologist:  None  Patient Profile    66 year old female with past medical history of hyperlipidemia, hypertension, history of smoking 1 pack/day, moderate nonobstructive coronary disease by recent left heart catheterization, who is here today for follow-up of nonobstructive coronary artery disease.  Past Medical History    Past Medical History:  Diagnosis Date   Allergy    Anxiety    Arthritis    FINGERS   Cataract    COPD (chronic obstructive pulmonary disease) (HCC)    Depression    GERD (gastroesophageal reflux disease)    Hyperlipidemia    Hypertension    NSTEMI (non-ST elevated myocardial infarction) ([place]) 09/23/2022   Postmenopausal bleeding 2014; 10/202018; 05/14/18   endometrial bx done 09/2015 - nl; nl paps   Past Surgical History:  Procedure Laterality Date   COLONOSCOPY WITH PROPOFOL N/A 07/20/2019   Procedure: COLONOSCOPY WITH PROPOFOL;  Surgeon: Valerie Lame, MD;  Location: [facility];  Service: Endoscopy;  Laterality: N/A;   HYSTEROSCOPY WITH D & C N/A 06/15/2018   Procedure: DILATATION AND CURETTAGE /HYSTEROSCOPY;  Surgeon: Valerie Fellers, MD;  Location: [facility];  Service: Gynecology;  Laterality: N/A;   LEFT HEART CATH AND CORONARY ANGIOGRAPHY N/A 09/23/2022   Procedure: LEFT HEART CATH AND CORONARY ANGIOGRAPHY;  Surgeon: Valerie Bush, MD;  Location: [facility];  Service: Cardiovascular;  Laterality: N/A;   POLYPECTOMY  07/20/2019   Procedure: POLYPECTOMY;  Surgeon: Valerie Lame, MD;  Location: [facility];  Service: Endoscopy;;   TUBAL LIGATION     WISDOM TOOTH EXTRACTION      Allergies  Allergies  Allergen Reactions   Ciprofloxacin Anaphylaxis   Clarithromycin Anaphylaxis   Other Anaphylaxis    ANTIBIOTICS-PT CAN TOLERATE Z-PAC AND DOXYCYCLINE BUT ALL OTHERS SHE  CANNOT   Penicillin G Anaphylaxis    Has patient had a PCN reaction causing immediate rash, facial/tongue/throat swelling, SOB or lightheadedness with hypotension: Yes Has patient had a PCN reaction causing severe rash involving mucus membranes or skin necrosis: No Has patient had a PCN reaction that required hospitalization: No Has patient had a PCN reaction occurring within the last 10 years: No If all of the above answers are "NO", then may proceed with Cephalosporin use.    Codeine Itching    vomiting   Fluoride Preparations Hives   Trazodone Itching    History of Present Illness    Valerie Donovan is a 66 year old female with a past medical history of hyperlipidemia, hypertension, history of smoking 1 pack/day, moderate nonobstructive coronary artery disease by recent left heart catheterization 09/2022.  She presented to the [facility] emergency department 06/23/2022 stating she had previously been in her usual state of health.  Approximately 4 AM she was woken with acute substernal chest pressure radiating into her jaw.  Given that her symptoms persisted she arrived to the emergency department for further evaluation.  In the emergency department her initial troponin was 2500.  EKG shows sinus tachycardia without ST abnormality.  She was started on IV heparin and proceeded with emergent left heart catheterization echocardiogram she was subsequently discharged from the hospital on 09/24/2022.  Echocardiogram revealed LVEF of 30-35%, left ventricle is moderately decreased function, global hypokinesis with preserved wall motion of the basal region suggestive of stress cardiomyopathy, G1 DD, mild mitral regurgitation and moderate tricuspid regurgitation.  Catheterization showed  moderate nonobstructive coronary disease 40% proximal LAD and mid RCA stenosis.  Severely reduced left ventricular systolic function with an LVEF of 25-35% with mild apical kinesis and preserved basal function consistent with  Takotsubo cardiomyopathy.  She was last seen in clinic 10/13/2022 stating that she has been doing well.  She denied any chest discomfort or shortness of breath preventing her palpitations.  She had still had fatigue and noticed an increase in her blood pressure.  On discharge from the hospital her antihypertensive medication had currently been on hold due to hypotension so the medication had to be restarted At follow-up.  She was also scheduled for a repeat limited echocardiogram to be scheduled in approximately 3 months.  She was also scheduled for cardiac rehab but was reluctant to start stating that she could participate in cardiac rehab on her own at home.  She returns clinic today stating that she has had an episode of jaw pain with out recurrent incidents of chest pain.  She was nervous at that point and proceeded to the [facility] emergency department for further evaluation.  Emergency department workup was unrevealing high-sensitivity troponins were negative x 2 without EKG changes.  She was subsequently discharged home and encouraged to keep her follow-up.  Today she is chest pain-free.  She denies any associated symptoms of jaw pain, shortness of breath, palpitations.  She also states that she has sustained from exerting herself and doing out door chores as she had previously done prior to her event.   Home Medications    Current Outpatient Medications  Medication Sig Dispense Refill   aspirin 81 MG chewable tablet Chew 1 tablet (81 mg total) by mouth daily. 90 tablet 1   atorvastatin (LIPITOR) 80 MG tablet Take 1 tablet (80 mg total) by mouth every evening. 90 tablet 1   busPIRone (BUSPAR) 5 MG tablet Take 1 tablet (5 mg total) by mouth 2 (two) times daily. 180 tablet 1   EPINEPHrine (EPIPEN 2-PAK) 0.3 mg/0.3 mL IJ SOAJ injection Inject 0.3 mg into the muscle as needed for anaphylaxis. 2 each 1   FLUoxetine (PROZAC) 40 MG capsule Take 1 capsule (40 mg total) by mouth daily. 90 capsule 1    fluticasone (FLONASE) 50 MCG/ACT nasal spray Place 2 sprays into both nostrils daily. 16 g 6   lisinopril (ZESTRIL) 5 MG tablet Take 1 tablet (5 mg total) by mouth daily. 30 tablet 3   metoprolol succinate (TOPROL-XL) 25 MG 24 hr tablet Take 0.5 tablets (12.5 mg total) by mouth at bedtime. 30 tablet 1   mirtazapine (REMERON) 15 MG tablet TAKE 1 TABLET(15 MG) BY MOUTH AT BEDTIME 90 tablet 0   omeprazole (PRILOSEC) 20 MG capsule Take 1 capsule (20 mg total) by mouth daily as needed (for acid reflux/indigestion.). 90 capsule 1   No current facility-administered medications for this visit.     Family History    Family History  Problem Relation Age of Onset   Cancer Mother    Diabetes Mother    Early death Mother    Cancer Father        colon   Arthritis Father    Diabetes Sister    Diabetes Sister    Diabetes Maternal Grandmother    Diabetes Maternal Grandfather    Breast cancer Neg Hx    She indicated that her mother is deceased. She indicated that her father is deceased. She indicated that both of her sisters are alive. She indicated that her maternal grandmother is deceased.  She indicated that her maternal grandfather is deceased. She indicated that her paternal grandmother is deceased. She indicated that her paternal grandfather is deceased. She indicated that both of her sons are alive. She indicated that the status of her neg hx is unknown.  Social History    Social History   Socioeconomic History   Marital status: Divorced    Spouse name: Not on file   Number of children: Not on file   Years of education: Not on file   Highest education level: Not on file  Occupational History   Occupation: retired  Tobacco Use   Smoking status: Every Day    Packs/day: 0.50    Years: 44.00    Total pack years: 22.00    Types: Cigarettes   Smokeless tobacco: Never  Vaping Use   Vaping Use: Former  Substance and Sexual Activity   Alcohol use: No   Drug use: No    Comment: CBD OIL  IN [place]   Sexual activity: Not Currently    Birth control/protection: Post-menopausal  Other Topics Concern   Not on file  Social History Narrative   Not on file   Social Determinants of Health   Financial Resource Strain: Low Risk  (12/30/2021)   Overall Financial Resource Strain (CARDIA)    Difficulty of Paying Living Expenses: Not very hard  Food Insecurity: No Food Insecurity (09/23/2022)   Hunger Vital Sign    Worried About Running Out of Food in the Last Year: Never true    Ran Out of Food in the Last Year: Never true  Transportation Needs: No Transportation Needs (09/23/2022)   PRAPARE - Hydrologist (Medical): No    Lack of Transportation (Non-Medical): No  Physical Activity: Sufficiently Active (12/30/2021)   Exercise Vital Sign    Days of Exercise per Week: 4 days    Minutes of Exercise per Session: 50 min  Stress: No Stress Concern Present (12/30/2021)   [place]    Feeling of Stress : Only a little  Social Connections: Socially Isolated (12/30/2021)   Social Connection and Isolation Panel [NHANES]    Frequency of Communication with Friends and Family: More than three times a week    Frequency of Social Gatherings with Friends and Family: More than three times a week    Attends Religious Services: Never    Marine scientist or Organizations: No    Attends Archivist Meetings: Never    Marital Status: Divorced  Human resources officer Violence: Not At Risk (09/23/2022)   Humiliation, Afraid, Rape, and Kick questionnaire    Fear of Current or Ex-Partner: No    Emotionally Abused: No    Physically Abused: No    Sexually Abused: No     Review of Systems    General:  No chills, fever, night sweats or weight changes.  Endorses fatigue Cardiovascular:  No chest pain, endorsed jaw pain, dyspnea on exertion, edema, orthopnea, palpitations, paroxysmal nocturnal  dyspnea. Dermatological: No rash, lesions/masses Respiratory: No cough, dyspnea Urologic: No hematuria, dysuria Abdominal:   No nausea, vomiting, diarrhea, bright red blood per rectum, melena, or hematemesis Neurologic:  No visual changes, wkns, changes in mental status. All other systems reviewed and are otherwise negative except as noted above.   Physical Exam    VS:  BP 128/72   Pulse 69   Ht 5' 2.6" (1.59 m)   Wt 136 lb 12.8 oz (62.1  kg)   LMP  (LMP Unknown)   SpO2 96%   BMI 24.54 kg/m  , BMI Body mass index is 24.54 kg/m.     GEN: Well nourished, well developed, in no acute distress. HEENT: normal.  Systolic Neck: Supple, no JVD, carotid bruits, or masses. Cardiac: RRR, no murmurs, rubs, or gallops. No clubbing, cyanosis, edema.  Radials 2+/PT 2+ and equal bilaterally.  Respiratory:  Respirations regular and unlabored, clear to auscultation bilaterally. GI: Soft, nontender, nondistended, BS + x 4. MS: no deformity or atrophy. Skin: warm and dry, no rash. Neuro:  Strength and sensation are intact. Psych: Normal affect.  Accessory Clinical Findings    ECG personally reviewed by me today-sinus rhythm with a rate of 69 with ST depression and T wave inversion in anterior and inferior lateral leads- No acute changes  Lab Results  Component Value Date   WBC 18.1 (H) 11/11/2022   HGB 13.0 11/11/2022   HCT 40.2 11/11/2022   MCV 94.8 11/11/2022   PLT 328 11/11/2022   Lab Results  Component Value Date   CREATININE 0.74 11/11/2022   BUN 12 11/11/2022   NA 135 11/11/2022   K 3.8 11/11/2022   CL 107 11/11/2022   CO2 21 (L) 11/11/2022   Lab Results  Component Value Date   ALT 15 10/25/2022   AST 19 10/25/2022   ALKPHOS 115 10/25/2022   BILITOT 0.7 10/25/2022   Lab Results  Component Value Date   CHOL 164 09/23/2022   HDL 47 09/23/2022   LDLCALC 97 09/23/2022   TRIG 100 09/23/2022   CHOLHDL 3.5 09/23/2022    Lab Results  Component Value Date   HGBA1C 5.7 (H)  09/23/2022    Assessment & Plan   1.  Nonobstructive coronary artery disease with recent left heart catheterization completed 09/23/2022 found to be Takotsubo.  40% proximal LAD and mid RCA stenosis.  She did have an episode of jaw pain that was previously unlike the pain that she had had with her initial presentation to the emergency department.  She went to the emergency department at [facility] and was reevaluated with unrevealing findings with high-sensitivity troponins negative x 2 and no changes noted on EKG.  EKG today is sinus with ST depression T wave inversions in anterior and inferior lateral leads consistent with EKG previously done in the emergency department.  She is continued on aspirin 81 mg daily, atorvastatin 80 mg daily, Toprol-XL 12.5 mg daily.  She is also being started on isosorbide mononitrate 15 mg daily and having a prescription of Nitrostat 0.4 mg sublingual as needed sent to the pharmacy of choice.  She has been advised as side effects of both new medications as well as administration instructions.  She is also being given ER precautions of taking the third Nitrostat.  She was previously referred to cardiac rehab and declined.  2.  Takotsubo cardiomyopathy with an LVEF 25-35% found on left heart catheterization.  Echocardiogram revealed LVEF 30-35% she is continued on Toprol-XL 12.5 mg daily as well as lisinopril 5 mg daily.  Of note her PTA lisinopril was 20 mg daily.  She continues to suffer from fatigue and occasional drops in blood pressure GDMT was not escalated today as she has just recently been started on Imdur for chest pain equivalent of jaw pain.  Limited echocardiogram has been scheduled for February we will try to move that up to the open in January slot.  3.  Essential hypertension blood pressure today 128/72.  She is continued on Toprol-XL 12.5 mg daily, lisinopril 5 mg daily, and new start newly started on isosorbide mononitrate 15 mg daily.  She has been encouraged to  monitor her pressure at home as well.  4.  Hyperlipidemia with repeat lipid and hepatic panel will need to be ordered on return visit.  She is continued on atorvastatin 80 mg daily.  5.  Tobacco abuse with continued smoking.  Patient is advised total cessation is recommended.  At this time she is not interested in stopping smoking.  6.  Disposition patient return to clinic to see MD/APP after limited echocardiogram or sooner if needed.  Parneet Glantz, NP 11/16/2022, 3:01 PM

## 2022-11-17 ENCOUNTER — Encounter: Payer: Self-pay | Admitting: Cardiology

## 2022-11-23 ENCOUNTER — Telehealth: Payer: Self-pay

## 2022-11-23 NOTE — Telephone Encounter (Signed)
        Patient  visited [facility] on 12/7     Telephone encounter attempt :  1st  A HIPAA compliant voice message was left requesting a return call.  Instructed patient to call back .    [place], Care Management  [phone] [address], [place], [place] [address] Phone: [phone] Email: [email or web address]

## 2022-11-23 NOTE — Telephone Encounter (Signed)
     Patient  visit on 12/7  at [place]   Have you been able to follow up with your primary care physician? Yes   The patient was or was not able to obtain any needed medicine or equipment. Yes   Are there diet recommendations that you are having difficulty following? Na   Patient expresses understanding of discharge instructions and education provided has no other needs at this time.  Yes      [facility] Care Guide, [place], Care Management  [phone] [address], [place], [place] [address] Phone: [phone] Email: [email or web address]    

## 2022-11-24 ENCOUNTER — Other Ambulatory Visit: Payer: Self-pay | Admitting: Nurse Practitioner

## 2022-11-25 NOTE — Telephone Encounter (Signed)
/  Refilled / #[id number] 1 rf - confirmed by same pharmacy. Requested Prescriptions  Pending Prescriptions Disp Refills   busPIRone (BUSPAR) 5 MG tablet [Pharmacy Med Name: BUSPIRONE '5MG'$  TABLETS] 120 tablet     Sig: TAKE 1 TABLET(5 MG) BY MOUTH TWICE DAILY     Psychiatry: Anxiolytics/Hypnotics - Non-controlled Passed - 11/24/2022  1:54 PM      Passed - Valid encounter within last 12 months    Recent Outpatient Visits           3 weeks ago Depression with anxiety   [place], NP   1 month ago Hospital discharge follow-up   [facility] Jon Billings, NP   3 months ago Annual physical exam   [place], NP   8 months ago Sleep disorder   [facility] Jon Billings, NP   9 months ago Primary hypertension   [facility] Jon Billings, NP       Future Appointments             In 1 month [place], Barbera Setters, NP [facility] [place]   In 5 months Jon Billings, NP [facility] [place]

## 2022-12-01 ENCOUNTER — Ambulatory Visit: Payer: PPO | Admitting: Cardiology

## 2022-12-29 ENCOUNTER — Ambulatory Visit: Payer: PPO | Attending: Cardiology

## 2022-12-29 DIAGNOSIS — I214 Non-ST elevation (NSTEMI) myocardial infarction: Secondary | ICD-10-CM | POA: Diagnosis not present

## 2022-12-29 LAB — ECHOCARDIOGRAM COMPLETE
AR max vel: 2.57 cm2
AV Area VTI: 2.25 cm2
AV Area mean vel: 2.38 cm2
AV Mean grad: 4 mmHg
AV Peak grad: 8.2 mmHg
Ao pk vel: 1.43 m/s
Area-P 1/2: 3.17 cm2
Calc EF: 55.4 %
S' Lateral: 2.7 cm
Single Plane A2C EF: 53.7 %
Single Plane A4C EF: 57.3 %

## 2022-12-31 NOTE — Progress Notes (Signed)
Good news your heart function has returned back to normal, previously it was 40%, no wall motion abnormalities noted, and no valvular abnormalities.

## 2023-01-02 NOTE — Progress Notes (Unsigned)
Cardiology Clinic Note   Patient Name: Valerie Donovan Date of Encounter: 01/04/2023  Primary Care Provider:  Jon Billings, NP Primary Cardiologist:  None  Patient Profile    67 year old female with a past medical history of hyperlipidemia, hypertension, history of smoking 1 pack/day, moderate nonobstructive coronary artery disease by recent left heart catheterization, who is here today for follow-up of nonobstructive coronary artery disease.  Past Medical History    Past Medical History:  Diagnosis Date   Allergy    Anxiety    Arthritis    FINGERS   Cataract    COPD (chronic obstructive pulmonary disease) (HCC)    COPD (chronic obstructive pulmonary disease) (HCC)    Depression    GERD (gastroesophageal reflux disease)    Hyperlipidemia    Hypertension    NSTEMI (non-ST elevated myocardial infarction) ([place]) 09/23/2022   Postmenopausal bleeding 2014; 10/202018; 05/14/18   endometrial bx done 09/2015 - nl; nl paps   Past Surgical History:  Procedure Laterality Date   COLONOSCOPY WITH PROPOFOL N/A 07/20/2019   Procedure: COLONOSCOPY WITH PROPOFOL;  Surgeon: Lucilla Lame, MD;  Location: [facility];  Service: Endoscopy;  Laterality: N/A;   HYSTEROSCOPY WITH D & C N/A 06/15/2018   Procedure: DILATATION AND CURETTAGE /HYSTEROSCOPY;  Surgeon: Homero Fellers, MD;  Location: [facility];  Service: Gynecology;  Laterality: N/A;   LEFT HEART CATH AND CORONARY ANGIOGRAPHY N/A 09/23/2022   Procedure: LEFT HEART CATH AND CORONARY ANGIOGRAPHY;  Surgeon: Nelva Bush, MD;  Location: [facility];  Service: Cardiovascular;  Laterality: N/A;   POLYPECTOMY  07/20/2019   Procedure: POLYPECTOMY;  Surgeon: Lucilla Lame, MD;  Location: [facility];  Service: Endoscopy;;   TUBAL LIGATION     WISDOM TOOTH EXTRACTION      Allergies  Allergies  Allergen Reactions   Ciprofloxacin Anaphylaxis   Clarithromycin Anaphylaxis   Other Anaphylaxis     ANTIBIOTICS-PT CAN TOLERATE Z-PAC AND DOXYCYCLINE BUT ALL OTHERS SHE CANNOT   Penicillin G Anaphylaxis    Has patient had a PCN reaction causing immediate rash, facial/tongue/throat swelling, SOB or lightheadedness with hypotension: Yes Has patient had a PCN reaction causing severe rash involving mucus membranes or skin necrosis: No Has patient had a PCN reaction that required hospitalization: No Has patient had a PCN reaction occurring within the last 10 years: No If all of the above answers are "NO", then may proceed with Cephalosporin use.    Codeine Itching    vomiting   Fluoride Preparations Hives   Trazodone Itching    History of Present Illness    Valerie Donovan is a 67 year old female with a previously mentioned past medical history of hyperlipidemia, hypertension, history of smoking 1 pack/day, moderate nonobstructive coronary artery disease with recent left heart catheterization 09/2022.  Echocardiogram completed 09/23/2022 revealed LVEF of 30-35%, moderately decreased function, global hypokinesis with preserved wall motion of the basal region suggestive of stress cardiomyopathy, G1 DD, mild mitral regurgitation, moderate tricuspid regurgitation, aortic sclerosis without stenosis.  Left heart catheterization completed 09/23/2022 revealed moderate nonobstructive coronary artery disease with 40% proximal LAD and mid RCA stenosis, severely reduced LVEF 25-35%, basal function consistent with Takotsubo cardiomyopathy, mildly elevated left ventricular filling pressure.  Recommendations were for general diuresis and medical therapy.  Repeat echocardiogram was completed 12/29/2022 which revealed LVEF 55-60%, no regional wall motion abnormalities, and no valvular abnormalities.  She was last seen in clinic 11/16/2022 where she had an episode of jaw pain and  was nervous and presented to the emergency department for further evaluation.  Workup in the emergency department at [facility] was unrevealing  she was subsequently discharged home.  At her appointment she was started on Toprol-XL 12.5 mg daily and isosorbide mononitrate 15 mg daily.  She returns to clinic today stating that she has been feeling well. No further episodes of chest pain. Denies any shortness of breath, palpitations, lightheadedness/dizziness, or peripheral edema. She is excited about improved heart function noted on her echocardiogram since her NSTEMI. She also denies any recent hospitalizations or visits to the emergency department.   Home Medications    Current Outpatient Medications  Medication Sig Dispense Refill   aspirin 81 MG chewable tablet Chew 1 tablet (81 mg total) by mouth daily. 90 tablet 1   atorvastatin (LIPITOR) 80 MG tablet Take 1 tablet (80 mg total) by mouth every evening. 90 tablet 1   busPIRone (BUSPAR) 5 MG tablet Take 1 tablet (5 mg total) by mouth 2 (two) times daily. 180 tablet 1   EPINEPHrine (EPIPEN 2-PAK) 0.3 mg/0.3 mL IJ SOAJ injection Inject 0.3 mg into the muscle as needed for anaphylaxis. 2 each 1   FLUoxetine (PROZAC) 40 MG capsule Take 1 capsule (40 mg total) by mouth daily. 90 capsule 1   fluticasone (FLONASE) 50 MCG/ACT nasal spray Place 2 sprays into both nostrils daily. 16 g 6   isosorbide mononitrate (IMDUR) 30 MG 24 hr tablet Take 0.5 tablets (15 mg total) by mouth daily. 45 tablet 1   lisinopril (ZESTRIL) 10 MG tablet Take 1 tablet (10 mg total) by mouth daily. 90 tablet 3   metoprolol succinate (TOPROL-XL) 25 MG 24 hr tablet Take 0.5 tablets (12.5 mg total) by mouth at bedtime. 30 tablet 1   mirtazapine (REMERON) 15 MG tablet TAKE 1 TABLET(15 MG) BY MOUTH AT BEDTIME 90 tablet 0   nitroGLYCERIN (NITROSTAT) 0.4 MG SL tablet Place 1 tablet (0.4 mg total) under the tongue every 5 (five) minutes as needed for chest pain. 25 tablet 3   omeprazole (PRILOSEC) 20 MG capsule Take 1 capsule (20 mg total) by mouth daily as needed (for acid reflux/indigestion.). 90 capsule 1   No current  facility-administered medications for this visit.     Family History    Family History  Problem Relation Age of Onset   Cancer Mother    Diabetes Mother    Early death Mother    Cancer Father        colon   Arthritis Father    Diabetes Sister    Diabetes Sister    Diabetes Maternal Grandmother    Diabetes Maternal Grandfather    Breast cancer Neg Hx    She indicated that her mother is deceased. She indicated that her father is deceased. She indicated that both of her sisters are alive. She indicated that her maternal grandmother is deceased. She indicated that her maternal grandfather is deceased. She indicated that her paternal grandmother is deceased. She indicated that her paternal grandfather is deceased. She indicated that both of her sons are alive. She indicated that the status of her neg hx is unknown.  Social History    Social History   Socioeconomic History   Marital status: Divorced    Spouse name: Not on file   Number of children: Not on file   Years of education: Not on file   Highest education level: Not on file  Occupational History   Occupation: retired  Tobacco Use   Smoking  status: Every Day    Packs/day: 0.50    Years: 44.00    Total pack years: 22.00    Types: Cigarettes   Smokeless tobacco: Never  Vaping Use   Vaping Use: Former  Substance and Sexual Activity   Alcohol use: No   Drug use: No    Comment: CBD OIL IN JUUL   Sexual activity: Not Currently    Birth control/protection: Post-menopausal  Other Topics Concern   Not on file  Social History Narrative   Not on file   Social Determinants of Health   Financial Resource Strain: Low Risk  (12/30/2021)   Overall Financial Resource Strain (CARDIA)    Difficulty of Paying Living Expenses: Not very hard  Food Insecurity: No Food Insecurity (09/23/2022)   Hunger Vital Sign    Worried About Running Out of Food in the Last Year: Never true    Ran Out of Food in the Last Year: Never true   Transportation Needs: No Transportation Needs (09/23/2022)   PRAPARE - Hydrologist (Medical): No    Lack of Transportation (Non-Medical): No  Physical Activity: Sufficiently Active (12/30/2021)   Exercise Vital Sign    Days of Exercise per Week: 4 days    Minutes of Exercise per Session: 50 min  Stress: No Stress Concern Present (12/30/2021)   [place]    Feeling of Stress : Only a little  Social Connections: Socially Isolated (12/30/2021)   Social Connection and Isolation Panel [NHANES]    Frequency of Communication with Friends and Family: More than three times a week    Frequency of Social Gatherings with Friends and Family: More than three times a week    Attends Religious Services: Never    Marine scientist or Organizations: No    Attends Archivist Meetings: Never    Marital Status: Divorced  Human resources officer Violence: Not At Risk (09/23/2022)   Humiliation, Afraid, Rape, and Kick questionnaire    Fear of Current or Ex-Partner: No    Emotionally Abused: No    Physically Abused: No    Sexually Abused: No     Review of Systems    General:  No chills, fever, night sweats or weight changes. Endorses improving fatigue  Cardiovascular:  No chest pain, dyspnea on exertion, edema, orthopnea, palpitations, paroxysmal nocturnal dyspnea. Dermatological: No rash, lesions/masses Respiratory: No cough, dyspnea Urologic: No hematuria, dysuria Abdominal:   No nausea, vomiting, diarrhea, bright red blood per rectum, melena, or hematemesis Neurologic:  No visual changes, wkns, changes in mental status. All other systems reviewed and are otherwise negative except as noted above.   Physical Exam    VS:  BP (!) 153/82 (BP Location: Left Arm, Patient Position: Sitting, Cuff Size: Normal)   Pulse (!) 58   Ht '5\' 2"'$  (1.575 m)   Wt 142 lb 9.6 oz (64.7 kg)   LMP  (LMP Unknown)    SpO2 97%   BMI 26.08 kg/m  , BMI Body mass index is 26.08 kg/m.     GEN: Well nourished, well developed, in no acute distress. HEENT: normal. Neck: Supple, no JVD, carotid bruits, or masses. Cardiac: RRR, no murmurs, rubs, or gallops. No clubbing, cyanosis, edema.  Radials 2+/PT 2+ and equal bilaterally.  Respiratory:  Respirations regular and unlabored, clear to auscultation bilaterally. GI: Soft, nontender, nondistended, BS + x 4. MS: no deformity or atrophy. Skin: warm and dry, no rash.  Neuro:  Strength and sensation are intact. Psych: Normal affect.  Accessory Clinical Findings    ECG personally reviewed by me today- No new tracings  Lab Results  Component Value Date   WBC 18.1 (H) 11/11/2022   HGB 13.0 11/11/2022   HCT 40.2 11/11/2022   MCV 94.8 11/11/2022   PLT 328 11/11/2022   Lab Results  Component Value Date   CREATININE 0.74 11/11/2022   BUN 12 11/11/2022   NA 135 11/11/2022   K 3.8 11/11/2022   CL 107 11/11/2022   CO2 21 (L) 11/11/2022   Lab Results  Component Value Date   ALT 15 10/25/2022   AST 19 10/25/2022   ALKPHOS 115 10/25/2022   BILITOT 0.7 10/25/2022   Lab Results  Component Value Date   CHOL 164 09/23/2022   HDL 47 09/23/2022   LDLCALC 97 09/23/2022   TRIG 100 09/23/2022   CHOLHDL 3.5 09/23/2022    Lab Results  Component Value Date   HGBA1C 5.7 (H) 09/23/2022    Assessment & Plan   1.  Nonobstructive coronary artery disease with recent left heart catheterization completed 09/23/2022 and was found to have  nonobstructive coronary artery disease with Takotsubo cardiomyopathy with 40% proximal LAD and mid RCA stenosis.  At her last visit she was started on Imdur 15 mg daily for angina.  Since that time she has been chest pain-free.  Since she has been continued on aspirin 81 mg daily, Lipitor 80 mg daily, Imdur 15 mg daily, Toprol-XL 12.5 mg daily Nitrostat 04 mg sublingual as needed.  She continues to decline cardiac rehab.  We also sent  her for a follow-up on her lipid panel after being on Lipitor 80 mg daily.  2.  Takotsubo cardiomyopathy with an LVEF of 25-35% found on left heart catheterization.  Limited echo was completed with this improved EF of 55-60%.  She is continued on aspirin, statin, beta-blocker therapy.  3.  Essential hypertension with blood pressure 158/78 recheck was 153/82.  She is continued on Toprol-XL 12.5 mg daily, lisinopril increased to 10 mg daily, continued on Imdur 50 mg daily.  In 2 weeks she will follow-up with her PCPs office for blood pressure check.  She will also place that is resolved since her monitor hard to determine if there needs to be any further up titration of her medications.  Of note her PTA lisinopril was 20 mg daily.  4.  Hyperlipidemia with repeat lipid meds been ordered today she just recently had hepatic panel done during her last emergency department visit.  At this time she is continued on atorvastatin 80 mg daily.  5.  Tobacco abuse with continued smoking.  Patient has been advised and encouraged for total cessation but currently is not interested in stopping smoking.  6.  Disposition patient return to clinic to see MD/APP in 3 months or sooner if needed.  Mayetta Castleman, NP 01/04/2023, 3:27 PM

## 2023-01-04 ENCOUNTER — Encounter: Payer: Self-pay | Admitting: Cardiology

## 2023-01-04 ENCOUNTER — Ambulatory Visit: Payer: PPO | Attending: Cardiology | Admitting: Cardiology

## 2023-01-04 VITALS — BP 153/82 | HR 58 | Ht 62.0 in | Wt 142.6 lb

## 2023-01-04 DIAGNOSIS — I10 Essential (primary) hypertension: Secondary | ICD-10-CM

## 2023-01-04 DIAGNOSIS — I214 Non-ST elevation (NSTEMI) myocardial infarction: Secondary | ICD-10-CM

## 2023-01-04 DIAGNOSIS — Z720 Tobacco use: Secondary | ICD-10-CM

## 2023-01-04 DIAGNOSIS — I5181 Takotsubo syndrome: Secondary | ICD-10-CM

## 2023-01-04 DIAGNOSIS — I2510 Atherosclerotic heart disease of native coronary artery without angina pectoris: Secondary | ICD-10-CM | POA: Diagnosis not present

## 2023-01-04 DIAGNOSIS — E7800 Pure hypercholesterolemia, unspecified: Secondary | ICD-10-CM | POA: Diagnosis not present

## 2023-01-04 MED ORDER — LISINOPRIL 10 MG PO TABS: 10.0000 mg | ORAL_TABLET | Freq: Every day | ORAL | 3 refills | Status: DC

## 2023-01-04 NOTE — Patient Instructions (Signed)
Medication Instructions:  Your physician has recommended you make the following change in your medication:   INCREASE Lisinopril 10 mg once daily   *If you need a refill on your cardiac medications before your next appointment, please call your pharmacy*   Lab Work: Lipid panel. This is a fasting lab so nothing to eat or drink after midnight before except sip of water with your medications.   If you have labs (blood work) drawn today and your tests are completely normal, you will receive your results only by: [place] (if you have MyChart) OR A paper copy in the mail If you have any lab test that is abnormal or we need to change your treatment, we will call you to review the results.   Testing/Procedures: None   Follow-Up: At [facility], you and your health needs are our priority.  As part of our continuing mission to provide you with exceptional heart care, we have created designated Provider Care Teams.  These Care Teams include your primary Cardiologist (physician) and Advanced Practice Providers (APPs -  Physician Assistants and Nurse Practitioners) who all work together to provide you with the care you need, when you need it.   Your next appointment:   3 month(s)  Provider:   Ida Rogue, MD or Gerrie Nordmann, NP

## 2023-01-13 ENCOUNTER — Other Ambulatory Visit: Payer: PPO

## 2023-01-17 ENCOUNTER — Telehealth: Payer: Self-pay | Admitting: Cardiovascular Disease

## 2023-01-17 MED ORDER — METOPROLOL SUCCINATE ER 25 MG PO TB24: 12.5000 mg | ORAL_TABLET | Freq: Every day | ORAL | 0 refills | Status: DC

## 2023-01-17 NOTE — Telephone Encounter (Signed)
*  STAT* If patient is at the pharmacy, call can be transferred to refill team.   1. Which medications need to be refilled? (please list name of each medication and dose if known)   metoprolol succinate (TOPROL-XL) 25 MG 24 hr tablet   2. Which pharmacy/location (including street and city if local pharmacy) is medication to be sent to?  [facility] [place], [address] AT [facility] [address]   3. Do they need a 30 day or 90 day supply?   90 day  Patient stated she is almost out of this medication.

## 2023-01-17 NOTE — Telephone Encounter (Signed)
Requested Prescriptions   Signed Prescriptions Disp Refills   metoprolol succinate (TOPROL-XL) 25 MG 24 hr tablet 45 tablet 0    Sig: Take 0.5 tablets (12.5 mg total) by mouth at bedtime.    Authorizing Provider: HAMMOCK, SHERI    Ordering User: Raelene Bott, Bernie Fobes L

## 2023-01-19 ENCOUNTER — Other Ambulatory Visit: Payer: Self-pay | Admitting: Cardiology

## 2023-01-19 DIAGNOSIS — Z7689 Persons encountering health services in other specified circumstances: Secondary | ICD-10-CM | POA: Diagnosis not present

## 2023-01-20 LAB — LIPID PANEL W/O CHOL/HDL RATIO
Cholesterol, Total: 109 mg/dL (ref 100–199)
HDL: 33 mg/dL — ABNORMAL LOW (ref >39)
LDL Chol Calc (NIH): 53 mg/dL (ref 0–99)
Triglycerides: 127 mg/dL (ref 0–149)
VLDL Cholesterol Cal: 23 mg/dL (ref 5–40)

## 2023-01-20 LAB — SPECIMEN STATUS REPORT

## 2023-01-21 NOTE — Progress Notes (Signed)
Cholesterol and LDL have improved. Continue with current medications without changes being made today.

## 2023-01-22 ENCOUNTER — Other Ambulatory Visit: Payer: Self-pay | Admitting: Nurse Practitioner

## 2023-01-24 NOTE — Telephone Encounter (Signed)
Requested Prescriptions  Pending Prescriptions Disp Refills   mirtazapine (REMERON) 15 MG tablet [Pharmacy Med Name: MIRTAZAPINE 15MG TABLETS] 90 tablet 0    Sig: TAKE 1 TABLET(15 MG) BY MOUTH AT BEDTIME     Psychiatry: Antidepressants - mirtazapine Passed - 01/24/2023  1:52 PM      Passed - Completed PHQ-2 or PHQ-9 in the last 360 days      Passed - Valid encounter within last 6 months    Recent Outpatient Visits           2 months ago Depression with anxiety   [place], NP   3 months ago Hospital discharge follow-up   [place], NP   5 months ago Annual physical exam   [place], NP   10 months ago Sleep disorder   [place], NP   11 months ago Primary hypertension   [place] Jon Billings, NP       Future Appointments             In 2 months Gerrie Nordmann, NP [facility] [place]   In 3 months Jon Billings, NP [place], [place]

## 2023-01-24 NOTE — Telephone Encounter (Signed)
The patient called back in to check on the status of this refill request. She says she is now out of her meds and will not have any for tonight. Please fill as soon as possible and let her know.

## 2023-02-01 ENCOUNTER — Ambulatory Visit (INDEPENDENT_AMBULATORY_CARE_PROVIDER_SITE_OTHER): Payer: PPO

## 2023-02-01 VITALS — Ht 62.0 in | Wt 142.0 lb

## 2023-02-01 DIAGNOSIS — Z0000 Encounter for general adult medical examination without abnormal findings: Secondary | ICD-10-CM

## 2023-02-01 NOTE — Progress Notes (Signed)
I connected with  Valerie Donovan on 02/01/23 by a audio enabled telemedicine application and verified that I am speaking with the correct person using two identifiers.  Patient Location: Home  Provider Location: Office/Clinic  I discussed the limitations of evaluation and management by telemedicine. The patient expressed understanding and agreed to proceed.  Subjective:   Valerie Donovan is a 67 y.o. female who presents for Medicare Annual (Subsequent) preventive examination.  Review of Systems     Cardiac Risk Factors include: advanced age (>60mn, >>68women);hypertension     Objective:    There were no vitals filed for this visit. There is no height or weight on file to calculate BMI.     02/01/2023    1:02 PM 11/11/2022   10:53 AM 09/23/2022    2:02 PM 09/23/2022   11:00 AM 09/23/2022    8:35 AM 12/30/2021   10:51 AM 05/08/2021   12:59 PM  Advanced Directives  Does Patient Have a Medical Advance Directive? No No No No No No No  Would patient like information on creating a medical advance directive? No - Patient declined   No - Patient declined No - Patient declined No - Patient declined     Current Medications (verified) Outpatient Encounter Medications as of 02/01/2023  Medication Sig   aspirin 81 MG chewable tablet Chew 1 tablet (81 mg total) by mouth daily.   atorvastatin (LIPITOR) 80 MG tablet Take 1 tablet (80 mg total) by mouth every evening.   busPIRone (BUSPAR) 5 MG tablet Take 1 tablet (5 mg total) by mouth 2 (two) times daily.   EPINEPHrine (EPIPEN 2-PAK) 0.3 mg/0.3 mL IJ SOAJ injection Inject 0.3 mg into the muscle as needed for anaphylaxis.   FLUoxetine (PROZAC) 40 MG capsule Take 1 capsule (40 mg total) by mouth daily.   fluticasone (FLONASE) 50 MCG/ACT nasal spray Place 2 sprays into both nostrils daily.   isosorbide mononitrate (IMDUR) 30 MG 24 hr tablet Take 0.5 tablets (15 mg total) by mouth daily.   lisinopril (ZESTRIL) 10 MG tablet Take 1 tablet (10  mg total) by mouth daily.   metoprolol succinate (TOPROL-XL) 25 MG 24 hr tablet Take 0.5 tablets (12.5 mg total) by mouth at bedtime.   mirtazapine (REMERON) 15 MG tablet TAKE 1 TABLET(15 MG) BY MOUTH AT BEDTIME   nitroGLYCERIN (NITROSTAT) 0.4 MG SL tablet Place 1 tablet (0.4 mg total) under the tongue every 5 (five) minutes as needed for chest pain.   omeprazole (PRILOSEC) 20 MG capsule Take 1 capsule (20 mg total) by mouth daily as needed (for acid reflux/indigestion.).   No facility-administered encounter medications on file as of 02/01/2023.    Allergies (verified) Ciprofloxacin, Clarithromycin, Other, Penicillin g, Codeine, Fluoride preparations, and Trazodone   History: Past Medical History:  Diagnosis Date   Allergy    Anxiety    Arthritis    FINGERS   Cataract    COPD (chronic obstructive pulmonary disease) (HCC)    COPD (chronic obstructive pulmonary disease) (HCC)    Depression    GERD (gastroesophageal reflux disease)    Hyperlipidemia    Hypertension    [place]STEMI (non-ST elevated [place] infarction) [place] 09[place]   Postmenopausal bleeding 2014; 10/202018; 05/14/18   endometrial bx done 09/2015 - nl; nl paps   Past Surgical History:  Procedure Laterality Date   COLONOSCOPY WITH PROPOFOL N/A 07/20/2019   Procedure: COLONOSCOPY WITH PROPOFOL;  Surgeon: WLucilla Lame MD;  [place] [facility]  Service: Endoscopy;  Laterality:  N/A;   HYSTEROSCOPY WITH D & C N/A 06/15/2018   Procedure: DILATATION AND CURETTAGE /HYSTEROSCOPY;  Surgeon: Homero Fellers, MD;  Location: [facility];  Service: Gynecology;  Laterality: N/A;   LEFT HEART CATH AND CORONARY ANGIOGRAPHY N/A 09/23/2022   Procedure: LEFT HEART CATH AND CORONARY ANGIOGRAPHY;  Surgeon: Nelva Bush, MD;  Location: [place];  Service: Cardiovascular;  Laterality: N/A;   POLYPECTOMY  07/20/2019   Procedure: POLYPECTOMY;  Surgeon: Lucilla Lame, MD;  Location: [place];  Service:  Endoscopy;;   TUBAL LIGATION     WISDOM TOOTH EXTRACTION     Family History  Problem Relation Age of Onset   Cancer Mother    Diabetes Mother    Early death Mother    Cancer Father        colon   Arthritis Father    Diabetes Sister    Diabetes Sister    Diabetes Maternal Grandmother    Diabetes Maternal Grandfather    Breast cancer Neg Hx    Social History   Socioeconomic History   Marital status: Divorced    Spouse name: Not on file   Number of children: Not on file   Years of education: Not on file   Highest education level: Not on file  Occupational History   Occupation: retired  Tobacco Use   Smoking status: Every Day    Packs/day: 0.50    Years: 44.00    Total pack years: 22.00    Types: Cigarettes   Smokeless tobacco: Never  Vaping Use   Vaping Use: Former  Substance and Sexual Activity   Alcohol use: No   Drug use: No    Comment: CBD OIL IN [place]   Sexual activity: Not Currently    Birth control/protection: Post-menopausal  Other Topics Concern   Not on file  Social History Narrative   Not on file   Social Determinants of Health   Financial Resource Strain: Low Risk  (02/01/2023)   Overall Financial Resource Strain (CARDIA)    Difficulty of Paying Living Expenses: Not hard at all  Food Insecurity: No Food Insecurity (02/01/2023)   Hunger Vital Sign    Worried About Running Out of Food in the Last Year: Never true    Ran Out of Food in the Last Year: Never true  Transportation Needs: No Transportation Needs (02/01/2023)   PRAPARE - Hydrologist (Medical): No    Lack of Transportation (Non-Medical): No  Physical Activity: Insufficiently Active (02/01/2023)   Exercise Vital Sign    Days of Exercise per Week: 3 days    Minutes of Exercise per Session: 30 min  Stress: No Stress Concern Present (02/01/2023)   [place]    Feeling of Stress : Not at all  Social  Connections: Socially Isolated (02/01/2023)   Social Connection and Isolation Panel [NHANES]    Frequency of Communication with Friends and Family: More than three times a week    Frequency of Social Gatherings with Friends and Family: Never    Attends Religious Services: Never    Marine scientist or Organizations: No    Attends Music therapist: Never    Marital Status: Divorced    Tobacco Counseling Ready to quit: Not Answered Counseling given: Not Answered   Clinical Intake:  Pre-visit preparation completed: Yes  Pain : No/denies pain     Nutritional Risks: None Diabetes: No  How often do you need to have someone help you when you read instructions, pamphlets, or other written materials from your doctor or pharmacy?: 1 - Never  Diabetic?no  Interpreter Needed?: No  Information entered by :: Kirke Shaggy, LPN   Activities of Daily Living    02/01/2023    1:02 PM 01/26/2023    9:03 AM  In your present state of health, do you have any difficulty performing the following activities:  Hearing? 0 0  Vision? 0 0  Difficulty concentrating or making decisions? 0 0  Walking or climbing stairs? 0 0  Dressing or bathing? 0 0  Doing errands, shopping? 0 0  Preparing Food and eating ? N N  Using the Toilet? N N  In the past six months, have you accidently leaked urine? N N  Do you have problems with loss of bowel control? N N  Managing your Medications? N N  Managing your Finances? N N  Housekeeping or managing your Housekeeping? N N    Patient Care Team: Jon Billings, NP as PCP - General  Indicate any recent Medical Services you may have received from other than [facility] providers in the past year (date may be approximate).     Assessment:   This is a routine wellness examination for Valerie Donovan.  Hearing/Vision screen Hearing Screening - Comments:: No aids Vision Screening - Comments:: Wears glasses- [facility]  Dietary issues and exercise  activities discussed: Current Exercise Habits: Home exercise routine, Type of exercise: walking, Time (Minutes): 30, Frequency (Times/Week): 3, Weekly Exercise (Minutes/Week): 90, Intensity: Mild   Goals Addressed             This Visit's Progress    DIET - EAT MORE FRUITS AND VEGETABLES         Depression Screen    02/01/2023    1:00 PM 11/03/2022    9:09 AM 08/19/2022    9:57 AM 03/25/2022   10:40 AM 02/11/2022   10:43 AM 12/30/2021   10:49 AM 07/29/2021    2:15 PM  PHQ 2/9 Scores  PHQ - 2 Score 0 0 0 2 0 0 0  PHQ- 9 Score 0 0 0 6 5      Fall Risk    02/01/2023    1:02 PM 01/26/2023    9:03 AM 11/03/2022    9:09 AM 08/19/2022    9:57 AM 03/25/2022   10:39 AM  Fall Risk   Falls in the past year? 0 0 0 0 0  Number falls in past yr: 0 0 0 0 0  Injury with Fall? 0 0 0 0 0  Risk for fall due to : No Fall Risks  No Fall Risks No Fall Risks No Fall Risks  Follow up Falls prevention discussed;Falls evaluation completed  Falls evaluation completed Falls evaluation completed Falls evaluation completed    FALL RISK PREVENTION PERTAINING TO THE HOME:  Any stairs in or around the home? Yes  If so, are there any without handrails? No  Home free of loose throw rugs in walkways, pet beds, electrical cords, etc? Yes  Adequate lighting in your home to reduce risk of falls? Yes   ASSISTIVE DEVICES UTILIZED TO PREVENT FALLS:  Life alert? No  Use of a cane, walker or w/c? No  Grab bars in the bathroom? Yes  Shower chair or bench in shower? No  Elevated toilet seat or a handicapped toilet? Yes    Cognitive Function:  02/01/2023    1:08 PM 12/19/2020    1:55 PM  6CIT Screen  What Year? 0 points 0 points  What month? 0 points 0 points  What time? 0 points 0 points  Count back from 20 2 points 0 points  Months in reverse 0 points 0 points  Repeat phrase 0 points 0 points  Total Score 2 points 0 points    Immunizations Immunization History  Administered Date(s)  Administered   PFIZER(Purple Top)SARS-COV-2 Vaccination 07/21/2020, 08/18/2020   Tdap 09/04/2015    TDAP status: Up to date  Flu Vaccine status: Declined, Education has been provided regarding the importance of this vaccine but patient still declined. Advised may receive this vaccine at local pharmacy or Health Dept. Aware to provide a copy of the vaccination record if obtained from local pharmacy or Health Dept. Verbalized acceptance and understanding.  Pneumococcal vaccine status: Declined,  Education has been provided regarding the importance of this vaccine but patient still declined. Advised may receive this vaccine at local pharmacy or Health Dept. Aware to provide a copy of the vaccination record if obtained from local pharmacy or Health Dept. Verbalized acceptance and understanding.   Covid-19 vaccine status: Completed vaccines  Qualifies for Shingles Vaccine? Yes   Zostavax completed No   Shingrix Completed?: No.    Education has been provided regarding the importance of this vaccine. Patient has been advised to call insurance company to determine out of pocket expense if they have not yet received this vaccine. Advised may also receive vaccine at local pharmacy or Health Dept. Verbalized acceptance and understanding.  Screening Tests Health Maintenance  Topic Date Due   Pneumonia Vaccine 22+ Years old (1 of 2 - PCV) Never done   Lung Cancer Screening  Never done   Zoster Vaccines- Shingrix (1 of 2) Never done   COVID-19 Vaccine (3 - 2023-24 season) 08/06/2022   INFLUENZA VACCINE  03/06/2023 (Originally 07/06/2022)   MAMMOGRAM  08/18/2023   Medicare Annual Wellness (AWV)  02/02/2024   COLONOSCOPY (Pts 45-28yr Insurance coverage will need to be confirmed)  07/19/2024   DTaP/Tdap/Td (2 - Td or Tdap) 09/03/2025   DEXA SCAN  Completed   Hepatitis C Screening  Completed   HPV VACCINES  Aged Out    Health Maintenance  Health Maintenance Due  Topic Date Due   Pneumonia Vaccine  67 Years old (1 of 2 - PCV) Never done   Lung Cancer Screening  Never done   Zoster Vaccines- Shingrix (1 of 2) Never done   COVID-19 Vaccine (3 - 2023-24 season) 08/06/2022    Colorectal cancer screening: Type of screening: Colonoscopy. Completed 07/20/19. Repeat every 5 years  Mammogram status: Completed 08/17/22. Repeat every year  Bone Density status: Completed 12/10/14. Results reflect: Bone density results: NORMAL. Repeat every 5 years.- declined referral  Lung Cancer Screening: (Low Dose CT Chest recommended if Age 69-80years, 30 pack-year currently smoking OR have quit w/in 15years.) does qualify.   Lung Cancer Screening Referral: had chest xray in October  Additional Screening:  Hepatitis C Screening: does qualify; Completed 11/11/17  Vision Screening: Recommended annual ophthalmology exams for early detection of glaucoma and other disorders of the eye. Is the patient up to date with their annual eye exam?  Yes  Who is the provider or what is the name of the office in which the patient attends annual eye exams? [facility]If pt is not established with a provider, would they like to be referred to a  provider to establish care? No .   Dental Screening: Recommended annual dental exams for proper oral hygiene  Community Resource Referral / Chronic Care Management: CRR required this visit?  No   CCM required this visit?  No      Plan:     I have personally reviewed and noted the following in the patient's chart:   Medical and social history Use of alcohol, tobacco or illicit drugs  Current medications and supplements including opioid prescriptions. Patient is not currently taking opioid prescriptions. Functional ability and status Nutritional status Physical activity Advanced directives List of other physicians Hospitalizations, surgeries, and ER visits in previous 12 months Vitals Screenings to include cognitive, depression, and falls Referrals and  appointments  In addition, I have reviewed and discussed with patient certain preventive protocols, quality metrics, and best practice recommendations. A written personalized care plan for preventive services as well as general preventive health recommendations were provided to patient.     Dionisio David, LPN   [id number]   Nurse Notes: none

## 2023-02-01 NOTE — Patient Instructions (Signed)
Valerie Donovan , Thank you for taking time to come for your Medicare Wellness Visit. I appreciate your ongoing commitment to your health goals. Please review the following plan we discussed and let me know if I can assist you in the future.   These are the goals we discussed:  Goals      DIET - EAT MORE FRUITS AND VEGETABLES     Patient Stated     12/19/2020, more exercise     Patient Stated     Continue current lifestyle        This is a list of the screening recommended for you and due dates:  Health Maintenance  Topic Date Due   Pneumonia Vaccine (1 of 2 - PCV) Never done   Screening for Lung Cancer  Never done   Zoster (Shingles) Vaccine (1 of 2) Never done   COVID-19 Vaccine (3 - 2023-24 season) 08/06/2022   Flu Shot  03/06/2023*   Mammogram  08/18/2023   Medicare Annual Wellness Visit  02/02/2024   Colon Cancer Screening  07/19/2024   DTaP/Tdap/Td vaccine (2 - Td or Tdap) 09/03/2025   DEXA scan (bone density measurement)  Completed   Hepatitis C Screening: USPSTF Recommendation to screen - Ages 58-79 yo.  Completed   HPV Vaccine  Aged Out  *Topic was postponed. The date shown is not the original due date.    Advanced directives: no  Conditions/risks identified: none  Next appointment: Follow up in one year for your annual wellness visit 02/07/24 @ 9:30 am by phone   Preventive Care 65 Years and Older, Female Preventive care refers to lifestyle choices and visits with your health care provider that can promote health and wellness. What does preventive care include? A yearly physical exam. This is also called an annual well check. Dental exams once or twice a year. Routine eye exams. Ask your health care provider how often you should have your eyes checked. Personal lifestyle choices, including: Daily care of your teeth and gums. Regular physical activity. Eating a healthy diet. Avoiding tobacco and drug use. Limiting alcohol use. Practicing safe sex. Taking low-dose  aspirin every day. Taking vitamin and mineral supplements as recommended by your health care provider. What happens during an annual well check? The services and screenings done by your health care provider during your annual well check will depend on your age, overall health, lifestyle risk factors, and family history of disease. Counseling  Your health care provider may ask you questions about your: Alcohol use. Tobacco use. Drug use. Emotional well-being. Home and relationship well-being. Sexual activity. Eating habits. History of falls. Memory and ability to understand (cognition). Work and work Statistician. Reproductive health. Screening  You may have the following tests or measurements: Height, weight, and BMI. Blood pressure. Lipid and cholesterol levels. These may be checked every 5 years, or more frequently if you are over 5 years old. Skin check. Lung cancer screening. You may have this screening every year starting at age 62 if you have a 30-pack-year history of smoking and currently smoke or have quit within the past 15 years. Fecal occult blood test (FOBT) of the stool. You may have this test every year starting at age 74. Flexible sigmoidoscopy or colonoscopy. You may have a sigmoidoscopy every 5 years or a colonoscopy every 10 years starting at age 68. Hepatitis C blood test. Hepatitis B blood test. Sexually transmitted disease (STD) testing. Diabetes screening. This is done by checking your blood sugar (glucose) after you  have not eaten for a while (fasting). You may have this done every 1-3 years. Bone density scan. This is done to screen for osteoporosis. You may have this done starting at age 92. Mammogram. This may be done every 1-2 years. Talk to your health care provider about how often you should have regular mammograms. Talk with your health care provider about your test results, treatment options, and if necessary, the need for more tests. Vaccines  Your  health care provider may recommend certain vaccines, such as: Influenza vaccine. This is recommended every year. Tetanus, diphtheria, and acellular pertussis (Tdap, Td) vaccine. You may need a Td booster every 10 years. Zoster vaccine. You may need this after age 78. Pneumococcal 13-valent conjugate (PCV13) vaccine. One dose is recommended after age 62. Pneumococcal polysaccharide (PPSV23) vaccine. One dose is recommended after age 6. Talk to your health care provider about which screenings and vaccines you need and how often you need them. This information is not intended to replace advice given to you by your health care provider. Make sure you discuss any questions you have with your health care provider. Document Released: 12/19/2015 Document Revised: 08/11/2016 Document Reviewed: 09/23/2015 Elsevier Interactive Patient Education  2017 [place] Prevention in the Home Falls can cause injuries. They can happen to people of all ages. There are many things you can do to make your home safe and to help prevent falls. What can I do on the outside of my home? Regularly fix the edges of walkways and driveways and fix any cracks. Remove anything that might make you trip as you walk through a door, such as a raised step or threshold. Trim any bushes or trees on the path to your home. Use bright outdoor lighting. Clear any walking paths of anything that might make someone trip, such as rocks or tools. Regularly check to see if handrails are loose or broken. Make sure that both sides of any steps have handrails. Any raised decks and porches should have guardrails on the edges. Have any leaves, snow, or ice cleared regularly. Use sand or salt on walking paths during winter. Clean up any spills in your garage right away. This includes oil or grease spills. What can I do in the bathroom? Use night lights. Install grab bars by the toilet and in the tub and shower. Do not use towel bars as  grab bars. Use non-skid mats or decals in the tub or shower. If you need to sit down in the shower, use a plastic, non-slip stool. Keep the floor dry. Clean up any water that spills on the floor as soon as it happens. Remove soap buildup in the tub or shower regularly. Attach bath mats securely with double-sided non-slip rug tape. Do not have throw rugs and other things on the floor that can make you trip. What can I do in the bedroom? Use night lights. Make sure that you have a light by your bed that is easy to reach. Do not use any sheets or blankets that are too big for your bed. They should not hang down onto the floor. Have a firm chair that has side arms. You can use this for support while you get dressed. Do not have throw rugs and other things on the floor that can make you trip. What can I do in the kitchen? Clean up any spills right away. Avoid walking on wet floors. Keep items that you use a lot in easy-to-reach places. If you need  to reach something above you, use a strong step stool that has a grab bar. Keep electrical cords out of the way. Do not use floor polish or wax that makes floors slippery. If you must use wax, use non-skid floor wax. Do not have throw rugs and other things on the floor that can make you trip. What can I do with my stairs? Do not leave any items on the stairs. Make sure that there are handrails on both sides of the stairs and use them. Fix handrails that are broken or loose. Make sure that handrails are as long as the stairways. Check any carpeting to make sure that it is firmly attached to the stairs. Fix any carpet that is loose or worn. Avoid having throw rugs at the top or bottom of the stairs. If you do have throw rugs, attach them to the floor with carpet tape. Make sure that you have a light switch at the top of the stairs and the bottom of the stairs. If you do not have them, ask someone to add them for you. What else can I do to help prevent  falls? Wear shoes that: Do not have high heels. Have rubber bottoms. Are comfortable and fit you well. Are closed at the toe. Do not wear sandals. If you use a stepladder: Make sure that it is fully opened. Do not climb a closed stepladder. Make sure that both sides of the stepladder are locked into place. Ask someone to hold it for you, if possible. Clearly mark and make sure that you can see: Any grab bars or handrails. First and last steps. Where the edge of each step is. Use tools that help you move around (mobility aids) if they are needed. These include: Canes. Walkers. Scooters. Crutches. Turn on the lights when you go into a dark area. Replace any light bulbs as soon as they burn out. Set up your furniture so you have a clear path. Avoid moving your furniture around. If any of your floors are uneven, fix them. If there are any pets around you, be aware of where they are. Review your medicines with your doctor. Some medicines can make you feel dizzy. This can increase your chance of falling. Ask your doctor what other things that you can do to help prevent falls. This information is not intended to replace advice given to you by your health care provider. Make sure you discuss any questions you have with your health care provider. Document Released: 09/18/2009 Document Revised: 04/29/2016 Document Reviewed: 12/27/2014 Elsevier Interactive Patient Education  2017 [employer].

## 2023-02-23 ENCOUNTER — Emergency Department: Payer: PPO

## 2023-02-23 ENCOUNTER — Telehealth: Payer: Self-pay | Admitting: Cardiovascular Disease

## 2023-02-23 ENCOUNTER — Emergency Department
Admission: EM | Admit: 2023-02-23 | Discharge: 2023-02-23 | Discharge status: Against Medical Advice | Payer: PPO | Attending: Emergency Medicine | Admitting: Emergency Medicine

## 2023-02-23 ENCOUNTER — Other Ambulatory Visit: Payer: Self-pay

## 2023-02-23 DIAGNOSIS — Z5321 Procedure and treatment not carried out due to patient leaving prior to being seen by health care provider: Secondary | ICD-10-CM | POA: Insufficient documentation

## 2023-02-23 DIAGNOSIS — R079 Chest pain, unspecified: Secondary | ICD-10-CM | POA: Diagnosis not present

## 2023-02-23 DIAGNOSIS — R112 Nausea with vomiting, unspecified: Secondary | ICD-10-CM | POA: Diagnosis not present

## 2023-02-23 LAB — CBC
HCT: 41.8 % (ref 36.0–46.0)
Hemoglobin: 13.8 g/dL (ref 12.0–15.0)
MCH: 30.5 pg (ref 26.0–34.0)
MCHC: 33 g/dL (ref 30.0–36.0)
MCV: 92.3 fL (ref 80.0–100.0)
Platelets: 382 10*3/uL (ref 150–400)
RBC: 4.53 MIL/uL (ref 3.87–5.11)
RDW: 13 % (ref 11.5–15.5)
WBC: 23 10*3/uL — ABNORMAL HIGH (ref 4.0–10.5)
nRBC: 0 % (ref 0.0–0.2)

## 2023-02-23 LAB — BASIC METABOLIC PANEL
Anion gap: 7 (ref 5–15)
BUN: 13 mg/dL (ref 8–23)
CO2: 23 mmol/L (ref 22–32)
Calcium: 9.5 mg/dL (ref 8.9–10.3)
Chloride: 109 mmol/L (ref 98–111)
Creatinine, Ser: 0.83 mg/dL (ref 0.44–1.00)
GFR, Estimated: >60 mL/min (ref >60)
Glucose, Bld: 123 mg/dL — ABNORMAL HIGH (ref 70–99)
Potassium: 3.8 mmol/L (ref 3.5–5.1)
Sodium: 139 mmol/L (ref 135–145)

## 2023-02-23 LAB — TROPONIN I (HIGH SENSITIVITY): Troponin I (High Sensitivity): 5 ng/L (ref <18)

## 2023-02-23 NOTE — Telephone Encounter (Signed)
Left voicemail message to call back  

## 2023-02-23 NOTE — Telephone Encounter (Signed)
Call received from front desk, transferred to [place] Triage;  Pt sees Dr. Rockey Situ in [place].   Pt stated she woke up between 4-5 am, having the first episode of chest pain.  Her chest pain was between a 6  to 7 on 1-10 pain scale.  Pt took 1 Nitro tablet, and a short while later vomited, and had a few episodes of diarrhea.  Pt took her BP and HR, and it was 182/79, and HR 86, O2 being 95%.  Pt waited 5-10 minutes after vomiting, and took another Nitro tablet.  Pt states that this helped only slightly, and her chest pain level decreased to 5, but now felt like a dull ache.  Pt states she took another Nitro ( number 3 ) around 1130 am, with the dull ache / chest pain still present.    I had Pt recheck her BP on the telephone with me an it was 148/81, with HR 84.    With Pt having multiple symptoms since 500 am, and after taking X3 Nitro's with no relief, I advised the Pt to go to the nearest ER for a providers care and assessment.  I encouraged her to call 911 for Ambulance transport, or have a family member / friend drive her there.    Pt agreed to going to the ER for care during my call, but refused to call 911 for Ambulance transport, and had no friends or family to drive her to the [facility] ER?  I did NOT advise she drive herself, to call 911 instead, but Pt refused to follow my order to do this.  Pt stated she was going to the ER and hung up the telephone.

## 2023-02-23 NOTE — ED Triage Notes (Signed)
Pt to ED from home for CP since 0500 this morning. Pt also had some N/V. She took 3 SL NTG at home with no relief and then came right in. Pt has cardiac HX of angina. Pt is CAOx4 and in no acute distress. Pt drover herself to the ED with CP. Pt in no acute distress.

## 2023-02-23 NOTE — Telephone Encounter (Signed)
Pt c/o of Chest Pain: STAT if CP now or developed within 24 hours  1. Are you having CP right now?  Yes, but not as bad as earlier this morning  2. Are you experiencing any other symptoms (ex. SOB, nausea, vomiting, sweating)?  Vomiting and diarrhea, 182/79  3. How long have you been experiencing CP?  Started around 4:00 - 5:00 AM and woke her up from her sleep. Pain in center of her chest felt like it did when she had a heart attack in the past   4. Is your CP continuous or coming and going?  Continuous   5. Have you taken Nitroglycerin?  Yes, she took 3 and got sick and had vomiting/diarrhea

## 2023-02-28 ENCOUNTER — Telehealth: Payer: Self-pay | Admitting: *Deleted

## 2023-02-28 NOTE — Telephone Encounter (Signed)
        Patient  visited [facility] on 02/23/2023  for treatment    Telephone encounter attempt :  1st  A HIPAA compliant voice message was left requesting a return call.  Instructed patient to call back at [phone]. [place] [phone] [address] , [place] [address] Email : [email or web address]

## 2023-03-02 ENCOUNTER — Telehealth: Payer: Self-pay | Admitting: *Deleted

## 2023-03-02 NOTE — Telephone Encounter (Signed)
        Patient  visited [facility] on 02/23/2023  for treatment    Telephone encounter attempt :  2nd  A HIPAA compliant voice message was left requesting a return call.  Instructed patient to call back at [phone].  [place] [phone] [address] , [place] [address] Email : [email or web address]

## 2023-03-03 ENCOUNTER — Telehealth: Payer: Self-pay | Admitting: *Deleted

## 2023-03-03 NOTE — Telephone Encounter (Signed)
        Patient  visited [facility] on 02/23/2023  for treatment    Telephone encounter attempt :  2nd  A HIPAA compliant voice message was left requesting a return call.  Instructed patient to call back at [phone].  [place] [phone] [address] , [place] [address] Email : [email or web address]

## 2023-03-10 ENCOUNTER — Ambulatory Visit (INDEPENDENT_AMBULATORY_CARE_PROVIDER_SITE_OTHER): Payer: PPO | Admitting: Family Medicine

## 2023-03-10 ENCOUNTER — Encounter: Payer: Self-pay | Admitting: Family Medicine

## 2023-03-10 VITALS — BP 127/71 | HR 74 | Temp 97.7°F | Ht 62.99 in | Wt 136.7 lb

## 2023-03-10 DIAGNOSIS — R8290 Unspecified abnormal findings in urine: Secondary | ICD-10-CM | POA: Diagnosis not present

## 2023-03-10 DIAGNOSIS — F17200 Nicotine dependence, unspecified, uncomplicated: Secondary | ICD-10-CM

## 2023-03-10 DIAGNOSIS — R634 Abnormal weight loss: Secondary | ICD-10-CM | POA: Diagnosis not present

## 2023-03-10 DIAGNOSIS — F32A Depression, unspecified: Secondary | ICD-10-CM

## 2023-03-10 DIAGNOSIS — R5383 Other fatigue: Secondary | ICD-10-CM | POA: Diagnosis not present

## 2023-03-10 LAB — URINALYSIS, ROUTINE W REFLEX MICROSCOPIC
Glucose, UA: NEGATIVE
Ketones, UA: NEGATIVE
Leukocytes,UA: NEGATIVE
Nitrite, UA: NEGATIVE
Specific Gravity, UA: 1.025 (ref 1.005–1.030)
Urobilinogen, Ur: 0.2 mg/dL (ref 0.2–1.0)
pH, UA: 5 (ref 5.0–7.5)

## 2023-03-10 LAB — MICROSCOPIC EXAMINATION: Bacteria, UA: NONE SEEN

## 2023-03-10 MED ORDER — MIRTAZAPINE 15 MG PO TABS: 30.0000 mg | ORAL_TABLET | Freq: Every day | ORAL | 1 refills | Status: DC

## 2023-03-10 NOTE — Assessment & Plan Note (Addendum)
Acute, ongoing. PHQ 9 score 12 today. Increase Remeron to 30 mg daily at bedtime. CBC, BMP, Mg, Phos, TSH-T4, and UA done today, will treat based on results.

## 2023-03-10 NOTE — Progress Notes (Signed)
BP 127/71   Pulse 74   Temp 97.7 F (36.5 C) (Oral)   Ht 5' 2.99" (1.6 m)   Wt 136 lb 11.2 oz (62 kg)   LMP  (LMP Unknown)   SpO2 98%   BMI 24.22 kg/m    Subjective:    Patient ID: Valerie Donovan, female    DOB: 14-May-1956, 67 y.o.   MRN: [id number]  HPI: Valerie Donovan is a 67 y.o. female  Chief Complaint  Patient presents with   Fatigue    Had an NSTEMI in October, has had 2 bouts chest pain (angina). Had an angina attack on 02/23/23 went to ER and left with out seeing provider. Has been feeling weak, no appetite since then.    She is a past smoker with 40+ year 1/2 pack a day history. She believes the 15 mg Remeron is causing her to have night sweats, but admits the medication makes her feel better. PHQ 9 score is 12 today.   WEIGHT LOSS/FATIGUE Has not had the energy to get up and do the things she used to do. She has lost 9 lbs over the past 3 weeks. She has had a low appetite, sometimes will only eat x1-2 meals a day. She does not forget to eat, does not feel hungry, she states "I just do not have an appetite". She admits to cooking sometimes and eating out. Water intake is "not enough", says "my urine looks orange".   Duration: 3 weeks Amount of weight loss: -9 lbs over 3 weeks Fevers: no Decreased appetite: yes Night sweats: yes for 1 month and 3 weeks since starting 15 mg of Remeron Dysphagia/odynophagia: no Chest pain: no Shortness of breath: no Cough: no Nausea: no Vomiting: no Abdominal pain: no Blood in stool: no Urine: looks orange, she tried drinking more water Easy bruising/bleeding: no Jaundice: no Polydipsia/polyuria: no Depression: yes; "just upset can't get up and go liked used to" Mood: Good. Has two sons and grandchildren that she is not close with. PHQ 9 score is 12 today.  Previous colonoscopy: yes 2020     03/10/2023   10:03 AM 02/01/2023    1:00 PM 11/03/2022    9:09 AM 08/19/2022    9:57 AM 03/25/2022   10:40 AM  Depression screen  PHQ 2/9  Decreased Interest 2 0 0 0 1  Down, Depressed, Hopeless 2 0 0 0 1  PHQ - 2 Score 4 0 0 0 2  Altered sleeping 2 0 0 0 1  Tired, decreased energy 3 0 0 0 1  Change in appetite 3 0 0 0 1  Feeling bad or failure about yourself  0 0 0 0 1  Trouble concentrating 0 0 0 0 0  Moving slowly or fidgety/restless 0 0 0 0 0  Suicidal thoughts 0 0 0 0 0  PHQ-9 Score 12 0 0 0 6  Difficult doing work/chores Not difficult at all Not difficult at all Not difficult at all Not difficult at all Not difficult at all    Relevant past medical, surgical, family and social history reviewed and updated as indicated. Interim medical history since our last visit reviewed. Allergies and medications reviewed and updated.  Review of Systems  Constitutional:  Positive for appetite change, fatigue and unexpected weight change. Negative for fever.  Respiratory: Negative.    Cardiovascular: Negative.   Gastrointestinal:  Negative for abdominal pain, blood in stool, nausea and vomiting.  Endocrine: Negative for polydipsia and polyphagia.  Genitourinary:  Negative for dysuria.  Skin: Negative.     Per HPI unless specifically indicated above     Objective:    BP 127/71   Pulse 74   Temp 97.7 F (36.5 C) (Oral)   Ht 5' 2.99" (1.6 m)   Wt 136 lb 11.2 oz (62 kg)   LMP  (LMP Unknown)   SpO2 98%   BMI 24.22 kg/m   Wt Readings from Last 3 Encounters:  03/10/23 136 lb 11.2 oz (62 kg)  02/23/23 145 lb (65.8 kg)  02/01/23 142 lb (64.4 kg)    Physical Exam Vitals and nursing note reviewed.  Constitutional:      General: She is awake. She is not in acute distress.    Appearance: Normal appearance. She is well-developed and well-groomed. She is not ill-appearing.  HENT:     Head: Normocephalic and atraumatic.     Right Ear: Hearing and external ear normal. No drainage.     Left Ear: Hearing and external ear normal. No drainage.     Nose: Nose normal.  Eyes:     General: Lids are normal.        Right  eye: No discharge.        Left eye: No discharge.     Conjunctiva/sclera: Conjunctivae normal.  Cardiovascular:     Rate and Rhythm: Normal rate and regular rhythm.     Pulses:          Carotid pulses are 2+ on the right side and 2+ on the left side.      Radial pulses are 2+ on the right side and 2+ on the left side.     Heart sounds: Normal heart sounds, S1 normal and S2 normal. No murmur heard.    No gallop.  Pulmonary:     Effort: Pulmonary effort is normal. No accessory muscle usage or respiratory distress.     Breath sounds: Normal breath sounds.  Abdominal:     General: Bowel sounds are decreased.  Musculoskeletal:        General: Normal range of motion.     Cervical back: Full passive range of motion without pain and normal range of motion.     Right lower leg: No edema.     Left lower leg: No edema.  Skin:    General: Skin is warm and dry.     Capillary Refill: Capillary refill takes less than 2 seconds.  Neurological:     Mental Status: She is alert and oriented to person, place, and time.  Psychiatric:        Attention and Perception: Attention normal.        Mood and Affect: Mood normal.        Speech: Speech normal.        Behavior: Behavior normal. Behavior is cooperative.        Thought Content: Thought content normal.     Results for orders placed or performed during the hospital encounter of [id number]  Basic metabolic panel  Result Value Ref Range   Sodium 139 135 - 145 mmol/L   Potassium 3.8 3.5 - 5.1 mmol/L   Chloride 109 98 - 111 mmol/L   CO2 23 22 - 32 mmol/L   Glucose, Bld 123 (H) 70 - 99 mg/dL   BUN 13 8 - 23 mg/dL   Creatinine, Ser 0.83 0.44 - 1.00 mg/dL   Calcium 9.5 8.9 - 10.3 mg/dL   GFR, Estimated >60 >60 mL/min   Anion  gap 7 5 - 15  CBC  Result Value Ref Range   WBC 23.0 (H) 4.0 - 10.5 K/uL   RBC 4.53 3.87 - 5.11 MIL/uL   Hemoglobin 13.8 12.0 - 15.0 g/dL   HCT 41.8 36.0 - 46.0 %   MCV 92.3 80.0 - 100.0 fL   MCH 30.5 26.0 - 34.0 pg    MCHC 33.0 30.0 - 36.0 g/dL   RDW 13.0 11.5 - 15.5 %   Platelets 382 150 - 400 K/uL   nRBC 0.0 0.0 - 0.2 %  Troponin I (High Sensitivity)  Result Value Ref Range   Troponin I (High Sensitivity) 5 <18 ng/L      Assessment & Plan:   Problem List Items Addressed This Visit       Other   Fatigue due to depression - Primary    Acute, ongoing. PHQ 9 score 12 today. Increase Remeron to 30 mg daily at bedtime. CBC, BMP, Mg, Phos, TSH-T4, and UA done today, will treat based on results.       Relevant Medications   mirtazapine (REMERON) 15 MG tablet   Other Relevant Orders   Urinalysis, Routine w reflex microscopic   CBC With Diff/Platelet   TSH + free T4   Magnesium   Basic Metabolic Panel (BMET)   Phosphorus   Other Visit Diagnoses     Weight loss       Acute, ongoing. Recommend Ensure and Boost protein drink along with adding high protein, high calorie diet, and increased water intake. Education provided.   Tobacco dependence with current use       Chronic, ongoing. Chest CT ordered.   Relevant Orders   CT CHEST WO CONTRAST        Follow up plan: Return in about 8 weeks (around 05/04/2023) for weight and mood follow up.

## 2023-03-10 NOTE — Patient Instructions (Addendum)
Try Boost and Ensure protein drinks.  Consume high protein high calorie meals. Take 2 of the 15 mg Remeron tablets at bedtime.

## 2023-03-11 LAB — CBC WITH DIFF/PLATELET
Basophils Absolute: 0.1 10*3/uL (ref 0.0–0.2)
Basos: 1 %
EOS (ABSOLUTE): 0.2 10*3/uL (ref 0.0–0.4)
Eos: 3 %
Hematocrit: 36.8 % (ref 34.0–46.6)
Hemoglobin: 12 g/dL (ref 11.1–15.9)
Immature Grans (Abs): 0 10*3/uL (ref 0.0–0.1)
Immature Granulocytes: 0 %
Lymphocytes Absolute: 2.8 10*3/uL (ref 0.7–3.1)
Lymphs: 30 %
MCH: 30.1 pg (ref 26.6–33.0)
MCHC: 32.6 g/dL (ref 31.5–35.7)
MCV: 92 fL (ref 79–97)
Monocytes Absolute: 0.6 10*3/uL (ref 0.1–0.9)
Monocytes: 6 %
Neutrophils Absolute: 5.7 10*3/uL (ref 1.4–7.0)
Neutrophils: 60 %
Platelets: 455 10*3/uL — ABNORMAL HIGH (ref 150–450)
RBC: 3.99 x10E6/uL (ref 3.77–5.28)
RDW: 12.4 % (ref 11.7–15.4)
WBC: 9.4 10*3/uL (ref 3.4–10.8)

## 2023-03-11 LAB — BASIC METABOLIC PANEL
BUN/Creatinine Ratio: 14 (ref 12–28)
BUN: 11 mg/dL (ref 8–27)
CO2: 22 mmol/L (ref 20–29)
Calcium: 9.7 mg/dL (ref 8.7–10.3)
Chloride: 104 mmol/L (ref 96–106)
Creatinine, Ser: 0.79 mg/dL (ref 0.57–1.00)
Glucose: 99 mg/dL (ref 70–99)
Potassium: 4 mmol/L (ref 3.5–5.2)
Sodium: 143 mmol/L (ref 134–144)
eGFR: 81 mL/min/{1.73_m2} (ref >59)

## 2023-03-11 LAB — TSH+FREE T4
Free T4: 1.05 ng/dL (ref 0.82–1.77)
TSH: 1.62 u[IU]/mL (ref 0.450–4.500)

## 2023-03-11 LAB — PHOSPHORUS: Phosphorus: 3.9 mg/dL (ref 3.0–4.3)

## 2023-03-11 LAB — MAGNESIUM: Magnesium: 2 mg/dL (ref 1.6–2.3)

## 2023-03-11 NOTE — Addendum Note (Signed)
Addended by: Prescott Gum on: 03/11/2023 01:14 PM   Modules accepted: Orders

## 2023-03-14 NOTE — Progress Notes (Signed)
LVM for patient to call office back to get scheduled for a 2 week lab only visit and put in CRM.

## 2023-03-14 NOTE — Addendum Note (Signed)
Addended by: Prescott Gum on: 03/14/2023 09:06 AM   Modules accepted: Orders

## 2023-03-14 NOTE — Progress Notes (Signed)
LVM for patient to call office back to get scheduled for a 2 week lab only visit and put in CRM. 

## 2023-03-21 ENCOUNTER — Ambulatory Visit
Admission: RE | Admit: 2023-03-21 | Discharge: 2023-03-21 | Disposition: A | Discharge status: Home / Self Care | Payer: PPO | Source: Ambulatory Visit | Attending: Nurse Practitioner | Admitting: Nurse Practitioner

## 2023-03-21 ENCOUNTER — Encounter: Payer: Self-pay | Admitting: Nurse Practitioner

## 2023-03-21 ENCOUNTER — Ambulatory Visit: Payer: Self-pay

## 2023-03-21 ENCOUNTER — Ambulatory Visit (INDEPENDENT_AMBULATORY_CARE_PROVIDER_SITE_OTHER): Payer: PPO | Admitting: Nurse Practitioner

## 2023-03-21 ENCOUNTER — Ambulatory Visit
Admission: RE | Admit: 2023-03-21 | Discharge: 2023-03-21 | Disposition: A | Discharge status: Home / Self Care | Payer: PPO | Attending: Nurse Practitioner | Admitting: Nurse Practitioner

## 2023-03-21 VITALS — BP 114/65 | HR 102 | Temp 100.4°F | Wt 133.7 lb

## 2023-03-21 DIAGNOSIS — R8290 Unspecified abnormal findings in urine: Secondary | ICD-10-CM | POA: Diagnosis not present

## 2023-03-21 DIAGNOSIS — R319 Hematuria, unspecified: Secondary | ICD-10-CM | POA: Diagnosis not present

## 2023-03-21 DIAGNOSIS — R0602 Shortness of breath: Secondary | ICD-10-CM | POA: Insufficient documentation

## 2023-03-21 DIAGNOSIS — J90 Pleural effusion, not elsewhere classified: Secondary | ICD-10-CM | POA: Diagnosis not present

## 2023-03-21 DIAGNOSIS — R509 Fever, unspecified: Secondary | ICD-10-CM | POA: Diagnosis not present

## 2023-03-21 DIAGNOSIS — R059 Cough, unspecified: Secondary | ICD-10-CM | POA: Diagnosis not present

## 2023-03-21 LAB — URINALYSIS, ROUTINE W REFLEX MICROSCOPIC
Glucose, UA: NEGATIVE
Nitrite, UA: NEGATIVE
Specific Gravity, UA: 1.02 (ref 1.005–1.030)
Urobilinogen, Ur: 1 mg/dL (ref 0.2–1.0)
pH, UA: 5 (ref 5.0–7.5)

## 2023-03-21 LAB — MICROSCOPIC EXAMINATION: Bacteria, UA: NONE SEEN

## 2023-03-21 MED ORDER — FUROSEMIDE 20 MG PO TABS: 20.0000 mg | ORAL_TABLET | Freq: Every day | ORAL | 0 refills | Status: DC

## 2023-03-21 MED ORDER — SULFAMETHOXAZOLE-TRIMETHOPRIM 800-160 MG PO TABS: 1.0000 | ORAL_TABLET | Freq: Two times a day (BID) | ORAL | 0 refills | Status: DC

## 2023-03-21 NOTE — Telephone Encounter (Signed)
  Chief Complaint: Weakness, loss of appetite, Tearful, Low BP Symptoms: above Frequency: Past few weeks Pertinent Negatives: Patient denies  Disposition: [] ED /[] Urgent Care (no appt availability in office) / [x] Appointment(In office/virtual)/ []  [place] [facility]/ [] Home Care/ [] Refused Recommended Disposition /[] [place] [facility]/ []  Follow-up with PCP Additional Notes: Pt states that weakness, nausea, weight loss is continuing. PT also thinks she is dehydrated. Pt is tearful. PT has stopped taking the Remeron and has flushed it down the toilet. Pt is very worried about her state of health.  Summary: Loss of appetite, weight loss, weakness   The patient called in stating she has lost at least 10 pounds in the last couple of weeks. She was put on an increased dosage of mirtazapine (REMERON) 15 MG tablet. She cannot tolerate this as it has made her very sick. Her blood pressure has also dropped while taking that medication. So much so that she flushed the rest down the toilet and she just feels really bad and weak. She has an appt with her provider first thing on Wednesday morning. She is also going to message her provider through the my chart to make her aware. Please assist patient further     Reason for Disposition  [1] MODERATE weakness (i.e., interferes with work, school, normal activities) AND [2] cause unknown  (Exceptions: Weakness from acute minor illness or poor fluid intake; weakness is chronic and not worse.)  Answer Assessment - Initial Assessment Questions 1. DESCRIPTION: "Describe how you are feeling."     Weakness, nausea, Low BP 114/60 2. SEVERITY: "How bad is it?"  "Can you stand and walk?"   - MILD (0-3): Feels weak or tired, but does not interfere with work, school or normal activities.   - MODERATE (4-7): Able to stand and walk; weakness interferes with work, school, or normal activities.   - SEVERE (8-10): Unable to stand or walk; unable to do usual  activities.     moderate 3. ONSET: "When did these symptoms begin?" (e.g., hours, days, weeks, months)     ED on Angina -  4. CAUSE: "What do you think is causing the weakness or fatigue?" (e.g., not drinking enough fluids, medical problem, trouble sleeping)     Remeron - Unsure - flushed Remeron 5. NEW MEDICINES:  "Have you started on any new medicines recently?" (e.g., opioid pain medicines, benzodiazepines, muscle relaxants, antidepressants, antihistamines, neuroleptics, beta blockers)     Remeron 6. OTHER SYMPTOMS: "Do you have any other symptoms?" (e.g., chest pain, fever, cough, SOB, vomiting, diarrhea, bleeding, other areas of pain)     Weakness, Weight loss  Protocols used: Weakness (Generalized) and Fatigue-A-AH

## 2023-03-21 NOTE — Progress Notes (Signed)
BP 114/65   Pulse (!) 102   Temp (!) 100.4 F (38 C) (Oral)   Wt 133 lb 11.2 oz (60.6 kg)   LMP  (LMP Unknown)   SpO2 96%   BMI 23.69 kg/m    Subjective:    Patient ID: Valerie Donovan, female    DOB: 04/17/1956, 67 y.o.   MRN: [id number]  HPI: Valerie Donovan is a 67 y.o. female  Chief Complaint  Patient presents with   Weight Loss    Pt states she is still having issues with losing weight, feeling fatigued, and feeling nauseous. States she feels that she is very dehydrated. States her Remeron was increased last visit but states she could not tolerate them so she flushed them down the commode.    Patient states she tried to eat a sausage patty and a cup of broth in two weeks.  She has been drinking ensure.  She is drinking about 3-4 bottles of water per day.  Symptoms started around March 20.    She was seen 4/4- remeron was increased.  She was not having nausea before that appointment.  Feels like her appetite was worsened since the remeron was increased.  The nausea has subsided since she stopped taking the medication.    Patient states she has a fever and congestion in her RUL of her lungs.  When she moves her head quickly it "roars".  Denies any nasal congestion, sore throat.  She is having SOB with walking.     Relevant past medical, surgical, family and social history reviewed and updated as indicated. Interim medical history since our last visit reviewed. Allergies and medications reviewed and updated.  Review of Systems  Constitutional:  Positive for fatigue and fever.  HENT:  Negative for congestion.   Respiratory:  Positive for shortness of breath. Negative for wheezing.     Per HPI unless specifically indicated above     Objective:    BP 114/65   Pulse (!) 102   Temp (!) 100.4 F (38 C) (Oral)   Wt 133 lb 11.2 oz (60.6 kg)   LMP  (LMP Unknown)   SpO2 96%   BMI 23.69 kg/m   Wt Readings from Last 3 Encounters:  03/21/23 133 lb 11.2 oz (60.6 kg)   03/10/23 136 lb 11.2 oz (62 kg)  02/23/23 145 lb (65.8 kg)    Physical Exam Vitals and nursing note reviewed.  Constitutional:      General: She is not in acute distress.    Appearance: Normal appearance. She is normal weight. She is not ill-appearing, toxic-appearing or diaphoretic.  HENT:     Head: Normocephalic.     Right Ear: External ear normal.     Left Ear: External ear normal.     Nose: Nose normal.     Mouth/Throat:     Mouth: Mucous membranes are moist.     Pharynx: Oropharynx is clear.  Eyes:     General:        Right eye: No discharge.        Left eye: No discharge.     Extraocular Movements: Extraocular movements intact.     Conjunctiva/sclera: Conjunctivae normal.     Pupils: Pupils are equal, round, and reactive to light.  Cardiovascular:     Rate and Rhythm: Normal rate and regular rhythm.     Heart sounds: No murmur heard. Pulmonary:     Effort: Pulmonary effort is normal. No respiratory distress.  Breath sounds: Examination of the right-upper field reveals decreased breath sounds. Decreased breath sounds and wheezing present. No rales.  Musculoskeletal:     Cervical back: Normal range of motion and neck supple.  Skin:    General: Skin is warm and dry.     Capillary Refill: Capillary refill takes less than 2 seconds.  Neurological:     General: No focal deficit present.     Mental Status: She is alert and oriented to person, place, and time. Mental status is at baseline.  Psychiatric:        Mood and Affect: Mood normal.        Behavior: Behavior normal.        Thought Content: Thought content normal.        Judgment: Judgment normal.     Results for orders placed or performed in visit on 03/10/23  Microscopic Examination   Urine  Result Value Ref Range   WBC, UA 0-5 0 - 5 /hpf   RBC, Urine 0-2 0 - 2 /hpf   Epithelial Cells (non renal) 0-10 0 - 10 /hpf   Casts Present (A) None seen /lpf   Cast Type Hyaline casts N/A   Mucus, UA Present (A)  Not Estab.   Bacteria, UA None seen None seen/Few  Urinalysis, Routine w reflex microscopic  Result Value Ref Range   Specific Gravity, UA 1.025 1.005 - 1.030   pH, UA 5.0 5.0 - 7.5   Color, UA Yellow Yellow   Appearance Ur Clear Clear   Leukocytes,UA Negative Negative   Protein,UA Trace (A) Negative/Trace   Glucose, UA Negative Negative   Ketones, UA Negative Negative   RBC, UA 2+ (A) Negative   Bilirubin, UA CANCELED    Urobilinogen, Ur 0.2 0.2 - 1.0 mg/dL   Nitrite, UA Negative Negative   Microscopic Examination See below:   Magnesium  Result Value Ref Range   Magnesium 2.0 1.6 - 2.3 mg/dL  Basic Metabolic Panel (BMET)  Result Value Ref Range   Glucose 99 70 - 99 mg/dL   BUN 11 8 - 27 mg/dL   Creatinine, Ser 1.61 0.57 - 1.00 mg/dL   eGFR 81 >09 UE/AVW/0.98   BUN/Creatinine Ratio 14 12 - 28   Sodium 143 134 - 144 mmol/L   Potassium 4.0 3.5 - 5.2 mmol/L   Chloride 104 96 - 106 mmol/L   CO2 22 20 - 29 mmol/L   Calcium 9.7 8.7 - 10.3 mg/dL  Phosphorus  Result Value Ref Range   Phosphorus 3.9 3.0 - 4.3 mg/dL  TSH + free T4  Result Value Ref Range   TSH 1.620 0.450 - 4.500 uIU/mL   Free T4 1.05 0.82 - 1.77 ng/dL  CBC With Diff/Platelet  Result Value Ref Range   WBC 9.4 3.4 - 10.8 x10E3/uL   RBC 3.99 3.77 - 5.28 x10E6/uL   Hemoglobin 12.0 11.1 - 15.9 g/dL   Hematocrit 11.9 14.7 - 46.6 %   MCV 92 79 - 97 fL   MCH 30.1 26.6 - 33.0 pg   MCHC 32.6 31.5 - 35.7 g/dL   RDW 82.9 56.2 - 13.0 %   Platelets 455 (H) 150 - 450 x10E3/uL   Neutrophils 60 Not Estab. %   Lymphs 30 Not Estab. %   Monocytes 6 Not Estab. %   Eos 3 Not Estab. %   Basos 1 Not Estab. %   Neutrophils Absolute 5.7 1.4 - 7.0 x10E3/uL   Lymphocytes Absolute 2.8 0.7 - 3.1  x10E3/uL   Monocytes Absolute 0.6 0.1 - 0.9 x10E3/uL   EOS (ABSOLUTE) 0.2 0.0 - 0.4 x10E3/uL   Basophils Absolute 0.1 0.0 - 0.2 x10E3/uL   Immature Granulocytes 0 Not Estab. %   Immature Grans (Abs) 0.0 0.0 - 0.1 x10E3/uL       Assessment & Plan:   Problem List Items Addressed This Visit   None Visit Diagnoses     Shortness of breath    -  Primary   Will check chest xray.  Will treat based on imaging.   Relevant Orders   CBC w/Diff   DG Chest 2 View   Fever, unspecified fever cause       CBC and UA obtained today. Will make recommendations based on lab results.   Relevant Orders   Urinalysis, Routine w reflex microscopic        Follow up plan: Return in about 2 days (around 03/23/2023) for Weight Loss .

## 2023-03-21 NOTE — Addendum Note (Signed)
Addended by: Larae Grooms on: 03/21/2023 02:48 PM   Modules accepted: Orders

## 2023-03-21 NOTE — Progress Notes (Signed)
Please let patient know that her urine was slightly abnormal and her chest xray does she a small amount of fluid.  I have sent in a prescription for Lasix to help with the fluid in her lungs.  I also sent in a short course of antibiotics in case her urine does grow bacteria.  Keep the appointment for 2 days so I can see how she is feeling.

## 2023-03-21 NOTE — Addendum Note (Signed)
Addended by: Larae Grooms on: 03/21/2023 01:48 PM   Modules accepted: Orders

## 2023-03-22 ENCOUNTER — Ambulatory Visit: Payer: Self-pay

## 2023-03-22 ENCOUNTER — Telehealth: Payer: Self-pay | Admitting: Nurse Practitioner

## 2023-03-22 ENCOUNTER — Encounter: Payer: Self-pay | Admitting: Nurse Practitioner

## 2023-03-22 LAB — CBC WITH DIFFERENTIAL/PLATELET
Basophils Absolute: 0.1 10*3/uL (ref 0.0–0.2)
Basos: 0 %
EOS (ABSOLUTE): 0.1 10*3/uL (ref 0.0–0.4)
Eos: 0 %
Hematocrit: 39 % (ref 34.0–46.6)
Hemoglobin: 12.9 g/dL (ref 11.1–15.9)
Immature Grans (Abs): 0.1 10*3/uL (ref 0.0–0.1)
Immature Granulocytes: 1 %
Lymphocytes Absolute: 3.6 10*3/uL — ABNORMAL HIGH (ref 0.7–3.1)
Lymphs: 17 %
MCH: 30.4 pg (ref 26.6–33.0)
MCHC: 33.1 g/dL (ref 31.5–35.7)
MCV: 92 fL (ref 79–97)
Monocytes Absolute: 1.8 10*3/uL — ABNORMAL HIGH (ref 0.1–0.9)
Monocytes: 9 %
Neutrophils Absolute: 15.1 10*3/uL — ABNORMAL HIGH (ref 1.4–7.0)
Neutrophils: 73 %
Platelets: 554 10*3/uL — ABNORMAL HIGH (ref 150–450)
RBC: 4.24 x10E6/uL (ref 3.77–5.28)
RDW: 12.1 % (ref 11.7–15.4)
WBC: 20.7 10*3/uL (ref 3.4–10.8)

## 2023-03-22 MED ORDER — DOXYCYCLINE HYCLATE 100 MG PO TABS: 100.0000 mg | ORAL_TABLET | Freq: Two times a day (BID) | ORAL | 0 refills | Status: DC

## 2023-03-22 NOTE — Telephone Encounter (Signed)
Copied from CRM [id number]. Topic: General - Other >> Mar 22, 2023  2:23 PM Franchot Heidelberg wrote: Appt on May 1st 10 am, on stand by. Urology. The abx doxycycline and lasix.   She is almost out of her Abx, she wants to know if her PCP will write her more Abx since she will not be seen until May 1st at 10 am (she will see Dr. Marylin Crosby).   [facility] in [place]

## 2023-03-22 NOTE — Addendum Note (Signed)
Addended by: Larae Grooms on: 03/22/2023 09:24 AM   Modules accepted: Orders

## 2023-03-22 NOTE — Progress Notes (Signed)
Called patient and spoke with her on the phone regarding her results.  Urgent referral placed for urology due to RBC and elevated white count.

## 2023-03-22 NOTE — Telephone Encounter (Signed)
Called and spoke with patient.  Will send in Doxycyline.

## 2023-03-22 NOTE — Telephone Encounter (Signed)
      Chief Complaint: Pt. Reports she is allergic to Bactrim. States she can only take "Z-pack or Doxycycline." Symptoms: n/a Frequency: n/a Pertinent Negatives: Patient denies  Disposition: ED /[] Urgent Care (no appt availability in office) / Appointment(In office/virtual)/  [place] [facility]/ Home Care/ Refused Recommended Disposition /[] [place] [facility]/  Follow-up with PCP Additional Notes: Please advise pt.  Answer Assessment - Initial Assessment Questions 1. NAME of MEDICINE: "What medicine(s) are you calling about?"     Bactrim 2. QUESTION: "What is your question?" (e.g., double dose of medicine, side effect)     I can't take Bactrim 3. PRESCRIBER: "Who prescribed the medicine?" Reason: if prescribed by specialist, call should be referred to that group.     [place] 4. SYMPTOMS: "Do you have any symptoms?" If Yes, ask: "What symptoms are you having?"  "How bad are the symptoms (e.g., mild, moderate, severe)     N/a 5. PREGNANCY:  "Is there any chance that you are pregnant?" "When was your last menstrual period?"     No  Protocols used: Medication Question Call-A-AH

## 2023-03-22 NOTE — Telephone Encounter (Signed)
I sent a new prescription of Doxycycline to the pharmacy this morning.  She shouldn't be out of the medication yet.

## 2023-03-22 NOTE — Telephone Encounter (Signed)
Patient notified of Karen's message.  

## 2023-03-23 ENCOUNTER — Ambulatory Visit: Payer: PPO | Admitting: Nurse Practitioner

## 2023-03-23 ENCOUNTER — Telehealth: Payer: Self-pay | Admitting: Nurse Practitioner

## 2023-03-23 LAB — URINE CULTURE

## 2023-03-23 NOTE — Progress Notes (Signed)
Hi Ms. Valerie Donovan. Your urine didn't grow any bacteria.  Go ahead and complete the course of doxycyline to see if your symptoms improve.  Keep that appt with Urology.

## 2023-03-23 NOTE — Telephone Encounter (Signed)
Copied from CRM [id number]. Topic: Appointment Scheduling - Scheduling Inquiry for Clinic >> Mar 23, 2023 10:16 AM Patsy Lager T wrote: Reason for CRM: Patient said she was in the office Monday the 15th and had labs, she wants to know if she needs to keep her appt for labs on 4/22, Please contact patient and advise

## 2023-03-23 NOTE — Telephone Encounter (Signed)
Please contact patient and cancel upcoming lab appointment per Valerie Donovan.

## 2023-03-23 NOTE — Telephone Encounter (Signed)
No she does not need to keep that appt.  I already rechecked her labs.

## 2023-03-23 NOTE — Telephone Encounter (Signed)
Appt canceled!

## 2023-03-28 ENCOUNTER — Other Ambulatory Visit: Payer: PPO

## 2023-04-03 ENCOUNTER — Encounter: Payer: Self-pay | Admitting: Emergency Medicine

## 2023-04-03 ENCOUNTER — Emergency Department
Admission: EM | Admit: 2023-04-03 | Discharge: 2023-04-03 | Disposition: A | Discharge status: Home / Self Care | Payer: PPO | Attending: Emergency Medicine | Admitting: Emergency Medicine

## 2023-04-03 ENCOUNTER — Other Ambulatory Visit: Payer: Self-pay

## 2023-04-03 ENCOUNTER — Emergency Department: Payer: PPO

## 2023-04-03 DIAGNOSIS — J449 Chronic obstructive pulmonary disease, unspecified: Secondary | ICD-10-CM | POA: Insufficient documentation

## 2023-04-03 DIAGNOSIS — I10 Essential (primary) hypertension: Secondary | ICD-10-CM | POA: Diagnosis not present

## 2023-04-03 DIAGNOSIS — R059 Cough, unspecified: Secondary | ICD-10-CM | POA: Diagnosis present

## 2023-04-03 DIAGNOSIS — R0600 Dyspnea, unspecified: Secondary | ICD-10-CM | POA: Diagnosis not present

## 2023-04-03 DIAGNOSIS — R079 Chest pain, unspecified: Secondary | ICD-10-CM | POA: Diagnosis not present

## 2023-04-03 DIAGNOSIS — R509 Fever, unspecified: Secondary | ICD-10-CM | POA: Diagnosis not present

## 2023-04-03 DIAGNOSIS — R0602 Shortness of breath: Secondary | ICD-10-CM | POA: Diagnosis not present

## 2023-04-03 LAB — TROPONIN I (HIGH SENSITIVITY)
Troponin I (High Sensitivity): 4 ng/L (ref <18)
Troponin I (High Sensitivity): 4 ng/L (ref <18)

## 2023-04-03 LAB — BASIC METABOLIC PANEL
Anion gap: 9 (ref 5–15)
BUN: 11 mg/dL (ref 8–23)
CO2: 25 mmol/L (ref 22–32)
Calcium: 9 mg/dL (ref 8.9–10.3)
Chloride: 101 mmol/L (ref 98–111)
Creatinine, Ser: 0.76 mg/dL (ref 0.44–1.00)
GFR, Estimated: >60 mL/min (ref >60)
Glucose, Bld: 107 mg/dL — ABNORMAL HIGH (ref 70–99)
Potassium: 3.6 mmol/L (ref 3.5–5.1)
Sodium: 135 mmol/L (ref 135–145)

## 2023-04-03 LAB — CBC
HCT: 37 % (ref 36.0–46.0)
Hemoglobin: 12 g/dL (ref 12.0–15.0)
MCH: 30.2 pg (ref 26.0–34.0)
MCHC: 32.4 g/dL (ref 30.0–36.0)
MCV: 93 fL (ref 80.0–100.0)
Platelets: 384 10*3/uL (ref 150–400)
RBC: 3.98 MIL/uL (ref 3.87–5.11)
RDW: 13.3 % (ref 11.5–15.5)
WBC: 19.5 10*3/uL — ABNORMAL HIGH (ref 4.0–10.5)
nRBC: 0 % (ref 0.0–0.2)

## 2023-04-03 MED ORDER — SODIUM CHLORIDE 0.9 % IV BOLUS
1000.0000 mL | Freq: Once | INTRAVENOUS | Status: AC
  Administered 2023-04-03: 1000 mL via INTRAVENOUS

## 2023-04-03 MED ORDER — IOHEXOL 350 MG/ML SOLN
75.0000 mL | Freq: Once | INTRAVENOUS | Status: AC | PRN
  Administered 2023-04-03: 75 mL via INTRAVENOUS

## 2023-04-03 NOTE — ED Triage Notes (Signed)
Pt via POV from home. Pt c/o fever, chest pain, and SOB for the past month. States that she had fluid surrounding her L lung. Pt was prescribed abx and lasix has not helped. States that she took her temp 1 hour ago and it was 100 but did not take any tylenol or ibuprofen to resolve it. Pt is A&OX4 and NAD

## 2023-04-03 NOTE — Discharge Instructions (Addendum)
Your workup today in the emergency department has shown reassuring results.  Your CT scan of the chest did show possible small amount of fluid around your heart.  Please discuss this with your cardiologist when you see them on Tuesday for possible echocardiogram to further evaluate.  Return to the emergency department for any chest pain trouble breathing or any other symptom personally concerning to yourself.

## 2023-04-03 NOTE — ED Provider Notes (Signed)
[facility] Provider Note    Event Date/Time   First MD Initiated Contact with Patient 04/03/23 1610     (approximate)  History   Chief Complaint: Cough, fever  HPI  Valerie Donovan is a 67 y.o. female with a past medical history of anxiety, COPD, hypertension, hyperlipidemia presents to the emergency department with complaints of cough and fever.  According to the patient for the past 1 month or so she has been experiencing intermittent cough states generalized weakness.  States she has not been eating or drinking as much either.  She saw her doctor 2 weeks ago who prescribed her a course of doxycycline as well as 5 days of Lasix for "fluid around her heart".  Patient states she is finished both these medications but continues to have fatigue and a slight cough at times.  Patient states she measured her temperature this morning it was 100.0 orally so the patient came back to the emergency department for evaluation.  Afebrile with our vitals 98.8, reassuring vital signs.  Patient states she has had microscopic blood in her urine over the last year so she follows up with Dr. Signa Kell and has an appointment on Wednesday.  Denies any bleeding or pain currently.  Had a recent urinalysis that was normal per patient as well as a negative urine culture.  Patient states she is also seeing her primary care doctor as her white blood cell count over the last several months has been up and down.  Denies any steroid or prednisone use recently.  Physical Exam   Triage Vital Signs: ED Triage Vitals  Enc Vitals Group     BP 04/03/23 1415 113/62     Pulse Rate 04/03/23 1415 83     Resp 04/03/23 1415 20     Temp 04/03/23 1415 98.8 F (37.1 C)     Temp Source 04/03/23 1415 Oral     SpO2 04/03/23 1415 94 %     Weight 04/03/23 1412 131 lb (59.4 kg)     Height 04/03/23 1412 5\' 3"  (1.6 m)     Head Circumference --      Peak Flow --      Pain Score 04/03/23 1412 6     Pain Loc --       Pain Edu? --      Excl. in GC? --     Most recent vital signs: Vitals:   04/03/23 1415  BP: 113/62  Pulse: 83  Resp: 20  Temp: 98.8 F (37.1 C)  SpO2: 94%    General: Awake, no distress.  CV:  Good peripheral perfusion.  Regular rate and rhythm  Resp:  Normal effort.  Equal breath sounds bilaterally.  Abd:  No distention.  Soft, nontender.  No rebound or guarding.  ED Results / Procedures / Treatments   EKG  EKG viewed and interpreted by myself shows a normal sinus rhythm at 81 bpm with a narrow QRS, normal axis, normal intervals, nonspecific ST changes.  RADIOLOGY  I have reviewed and interpreted the chest x-ray images.  No obvious consolidation seen on my evaluation. Radiology states persistent irregular opacity at the left lung base.  Possible small left pleural effusion.   MEDICATIONS ORDERED IN ED: Medications  sodium chloride 0.9 % bolus 1,000 mL (has no administration in time range)     IMPRESSION / MDM / ASSESSMENT AND PLAN / ED COURSE  I reviewed the triage vital signs and the nursing notes.  Patient's  presentation is most consistent with acute presentation with potential threat to life or bodily function.  Patient presents emergency department for continued intermittent low-grade fever and cough over the past 1 month.  Has completed a course of antibiotics as well as 5 days of Lasix prescribed by her PCP.  Patient states she has lost several pounds recently but states she has not been eating or drinking as much.  Patient describes general lack of appetite.  No abdominal pain.  No fever no vomiting or diarrhea.  No urinary symptoms but has been following up with urology for microscopic hematuria.  I reviewed the patient's most recent urinalysis and microscopy's are negative for blood.  Patient's lab work today does show leukocytosis with a white blood cell count of 19,000 however not significantly changed from the patient's recent white blood cell count lab  values.  Troponin is reassuringly negative and chemistry is normal with normal renal function.  However given the patient's complaint of intermittent low-grade fever and the persistent findings on the chest x-ray we will proceed with a CTA of the chest to evaluate for pulmonary embolism but also to evaluate for possible pneumonia or mass given the recent weight loss.  Patient agreeable to plan of care.  CTA is negative for PE no mass or tumor.  No obvious consolidation.  Patient possibly has a pericardial effusion.  She actually has an appointment on Tuesday with her cardiologist.  Recommended that she discuss this with them for possible echocardiogram.  Patient very reassured by today's workup.  Will discharge from the emergency department with cardiology follow-up on Tuesday.  FINAL CLINICAL IMPRESSION(S) / ED DIAGNOSES   Dyspnea    Note:  This document was prepared using Dragon voice recognition software and may include unintentional dictation errors.   Minna Antis, MD 04/03/23 [phone]

## 2023-04-04 ENCOUNTER — Telehealth: Payer: Self-pay | Admitting: Cardiovascular Disease

## 2023-04-04 NOTE — Telephone Encounter (Signed)
Pt states that she was in the ED yesterday and was told to call and have an Echo order put in due to their findings. Pt would like a callback regarding this matter. Please advise

## 2023-04-04 NOTE — Telephone Encounter (Signed)
Reviewed the patient's chart. She is scheduled to be seen on 04/05/23 with Charlsie Quest, NP.  To Sheri as an [place].

## 2023-04-05 ENCOUNTER — Encounter: Payer: Self-pay | Admitting: Cardiology

## 2023-04-05 ENCOUNTER — Ambulatory Visit: Payer: PPO | Attending: Cardiology | Admitting: Cardiology

## 2023-04-05 VITALS — BP 126/70 | HR 97 | Ht 63.0 in | Wt 134.0 lb

## 2023-04-05 DIAGNOSIS — I10 Essential (primary) hypertension: Secondary | ICD-10-CM | POA: Diagnosis not present

## 2023-04-05 DIAGNOSIS — J90 Pleural effusion, not elsewhere classified: Secondary | ICD-10-CM | POA: Diagnosis not present

## 2023-04-05 DIAGNOSIS — I5181 Takotsubo syndrome: Secondary | ICD-10-CM

## 2023-04-05 DIAGNOSIS — Z720 Tobacco use: Secondary | ICD-10-CM

## 2023-04-05 DIAGNOSIS — I3139 Other pericardial effusion (noninflammatory): Secondary | ICD-10-CM | POA: Diagnosis not present

## 2023-04-05 DIAGNOSIS — I2510 Atherosclerotic heart disease of native coronary artery without angina pectoris: Secondary | ICD-10-CM

## 2023-04-05 MED ORDER — COLCHICINE 0.6 MG PO TABS: 0.6000 mg | ORAL_TABLET | Freq: Every day | ORAL | 0 refills | Status: DC

## 2023-04-05 MED ORDER — COLCHICINE 0.6 MG PO TABS: 0.6000 mg | ORAL_TABLET | Freq: Two times a day (BID) | ORAL | 0 refills | Status: DC

## 2023-04-05 MED ORDER — AZITHROMYCIN 250 MG PO TABS: ORAL_TABLET | ORAL | 0 refills | Status: DC

## 2023-04-05 NOTE — Progress Notes (Signed)
Cardiology Office Note:   Date:  04/05/2023  ID:  Valerie Donovan, DOB June 13, 1956, MRN [id number]  History of Present Illness:   Valerie Donovan is a 67 y.o. female with past medical history of hyperlipidemia, hypertension, history of smoking 1 pack/day, moderate nonobstructive coronary disease by recent left heart catheterization, who is here today for follow-up after recent chest scan done in the emergency department revealed a small pericardial effusion.  Echocardiogram completed 09/23/2022 revealed LVEF 30-35%, moderately decreased function and global hypokinesis with preserved wall motion, G1 DD, mild mitral regurgitation, moderate tricuspid regurgitation, aortic sclerosis without stenosis.  Left heart catheterization completed 09/23/2022 revealed moderate nonobstructive coronary artery disease with 40% proximal LAD and mid RCA stenosis, severely reduced LVEF 25-35%, basal function consistent with Takotsubo cardiomyopathy, mildly elevated left ventricular filling pressure.  Recommendations were for general diuresis and medical therapy.  Repeat echocardiogram completed 12/25/2022 revealed LVEF of 55 to 60%, no regional wall motion abnormalities and no valvular abnormalities.  She was last seen in clinic 12/25/2022 stating that she was feeling well.  She continued to remain chest pain-free.  And also continued to decline cardiac rehab.  Was continued on her current medication regimen without any further changes noted or further testing that was ordered at that time.  She was evaluated in the [facility] emergency department 04/03/2023 for cough and a fever.  She had seen her primary care doctor 2 weeks prior who prescribed her a course of doxycycline as well as 5 days of Lasix for fluid around her heart. (Of note her PCP ordered a chest x-ray that showed a new small pleural effusion on the left) patient states she finished both medication but continues to have fatigue and a slight cough at times.  She was  found to have a leukocytosis with a white blood count of 19,000 but not significantly changed the patient's recent white blood count values.  Troponin was reassuringly negative and chemistry was normal with normal renal function.  However given the patient's complaint of intermittent low-grade fever and persistent findings on chest x-ray she was sent for CTA of the chest to evaluate for pulmonary embolism and also evaluate for possible pneumonia or mass given recent weight loss.  CTA was negative for PE, mass, or tumor.  There was no obvious consolidation.  Patient possibly had pericardial effusion.  She was subsequently discharged and encouraged to continue to keep her appointment with cardiology.  She returns to clinic today and is tearful. States that since the last time she has been seen in clinic she has been to the emergency department three times and has not seen a primary care provider.  There was some concern for some shortness of breath she had a chest x-ray completed that showed a small left pleural effusion.  PCP placed on doxycycline for 5 days and give her Lasix for 5 days.  She completed both of those medication regimen without improvement in her symptoms.  She has continued to have a cough and a low-grade fever, night sweats, right upper chest discomfort concerning for pneumonia with CT and a chest x-ray that was clear, 1 episode of chest pressure, and previously been told that she had a pericardial effusion related to static.  She comes into clinic today stating that she has one step from the morgue and her son is looking to take her to [facility] to get some answers as she has not had any answers thus far.   ROS: 10 point review of systems is  completed and is considered negative with exception of what is listed in the HPI  Studies Reviewed:    EKG: No new tracings been completed today  TTE 12/29/22 1. Left ventricular ejection fraction, by estimation, is 55 to 60%. The  left ventricle has  normal function. The left ventricle has no regional  wall motion abnormalities. Left ventricular diastolic parameters were  normal.   2. Right ventricular systolic function is normal. The right ventricular  size is normal.   3. The mitral valve is normal in structure. No evidence of mitral valve  regurgitation.   4. The aortic valve is tricuspid. Aortic valve regurgitation is not  visualized.   5. The inferior vena cava is normal in size with greater than 50%  respiratory variability, suggesting right atrial pressure of 3 mmHg.   LHC 09/23/22 Conclusions: Moderate, nonobstructive coronary artery disease with 40% proximal LAD and mid RCA stenoses. Severely reduced left ventricular systolic function (LVEF 25-35%) with mid and apical akinesis and preserved basal function consistent with Takotsubo (stress-induced) cardiomyopathy. Mildly elevated left ventricular filling pressure (LVEDP 20 mmHg).   Recommendations: Gentle diuresis and judicious initiation of goal-directed medical therapy as blood pressure and renal function allow. Medical therapy and risk factor modification to prevent progression of moderate, nonobstructive coronary artery disease. Risk Assessment/Calculations:              Physical Exam:   VS:  BP 126/70 (BP Location: Left Arm, Patient Position: Sitting, Cuff Size: Normal)   Pulse 97   Ht 5\' 3"  (1.6 m)   Wt 134 lb (60.8 kg)   LMP  (LMP Unknown)   SpO2 96%   BMI 23.74 kg/m    Wt Readings from Last 3 Encounters:  04/05/23 134 lb (60.8 kg)  04/03/23 131 lb (59.4 kg)  03/21/23 133 lb 11.2 oz (60.6 kg)     GEN: Well nourished, well developed in no acute distress NECK: No JVD; No carotid bruits, swollen lymph nodes with R>L CARDIAC: RRR, no murmurs, rubs, gallops RESPIRATORY:  Clear upper lobes and diminished bases to auscultation without rales, wheezing or rhonchi  ABDOMEN: Soft, non-tender, non-distended EXTREMITIES:  No edema; No deformity   ASSESSMENT AND  PLAN:   Small pericardial effusion noted on chest CTA completed in the emergency department.  Fortunately there was no evidence of acute pulmonary embolism or any acute vascular findings in the chest.  A small pericardial effusion was present thickening with soft tissue stranding in the epicardial and pericardial fat.  Findings were nonspecific but could be secondary to pericarditis.  There were no focal masses, lesions, calcification noted, recommendation was further evaluation with an echocardiogram.  There was also a linear left lower lobe opacity most consistent with scarring or atelectasis without consolidation.  With her nonspecific symptoms she has been started on colchicine 0.6 mg daily and scheduled for a limited echocardiogram.  She has been advised that often CT scans overestimate the amount of pericardial fluid and that she is currently not exhibiting symptoms of an emergency like tamponade.  Nonobstructive coronary disease with a left heart catheterization completed 09/23/2022 which revealed nonobstructive coronary artery disease with Takotsubo cardiomyopathy with 40% proximal LAD and mid RCA stenosis.  She had 1 episode of her pressure that has been of late has continued on Imdur, aspirin, atorvastatin, metoprolol, she has not required the use of any of her Nitrostat at this time.  She does continue to decline cardiac rehab.  Takotsubo cardiomyopathy with an LVEF of 25-35% found  on left heart catheterization.  Limited echocardiogram was completed with improved LVEF of 55 to 60%.  She was continued on aspirin, statin, beta-blocker.  Essential hypertension with blood pressure today 126/70.  Blood pressure remained stable.  She is continued on current medication regimen without any further changes that if noted.  Hyperlipidemia where she has been continued on atorvastatin.  LDL 53 01/19/2023.  Goal is less than 70.  No changes made to current medication regimen.  Swollen lymph nodes with  continued fever and cough.  Previously had taken doxycycline but continues to have elevated white count.  Started on a Z-Pak today.  Disposition patient return to clinic to see MD/APP after limited echocardiogram has been completed.        Signed, Valerie Vanhook, NP

## 2023-04-05 NOTE — Patient Instructions (Addendum)
Medication Instructions:  START colchicine 0.6 mg by mouth once a day for 30 days  START Z-pak as directed on packaging  *If you need a refill on your cardiac medications before your next appointment, please call your pharmacy*  Lab Work: No labs ordered  If you have labs (blood work) drawn today and your tests are completely normal, you will receive your results only by: MyChart Message (if you have MyChart) OR A paper copy in the mail If you have any lab test that is abnormal or we need to change your treatment, we will call you to review the results.   Testing/Procedures: Your physician has requested that you have a stat limited echocardiogram. Echocardiography is a painless test that uses sound waves to create images of your heart. It provides your doctor with information about the size and shape of your heart and how well your heart's chambers and valves are working. This procedure takes approximately one hour. There are no restrictions for this procedure. Please do NOT wear cologne, perfume, aftershave, or lotions (deodorant is allowed). Please arrive 15 minutes prior to your appointment time.  Follow-Up: At [facility], you and your health needs are our priority.  As part of our continuing mission to provide you with exceptional heart care, we have created designated Provider Care Teams.  These Care Teams include your primary Cardiologist (physician) and Advanced Practice Providers (APPs -  Physician Assistants and Nurse Practitioners) who all work together to provide you with the care you need, when you need it.  Your next appointment:   Follow-up after echo   Provider:   Charlsie Quest, NP

## 2023-04-05 NOTE — Telephone Encounter (Signed)
Charlsie Quest, NP  Sent: Tue April 05, 2023  9:58 AM  To: Jefferey Pica, RN         Message  Will schedule at visit, thanks

## 2023-04-06 ENCOUNTER — Encounter: Payer: PPO | Admitting: Urology

## 2023-04-06 ENCOUNTER — Telehealth: Payer: Self-pay | Admitting: Cardiovascular Disease

## 2023-04-06 NOTE — Telephone Encounter (Signed)
Pt c/o medication issue:  1. Name of Medication:   azithromycin (ZITHROMAX Z-PAK) 250 MG tablet  colchicine 0.6 MG tablet   2. How are you currently taking this medication (dosage and times per day)?   Only taking antibiotic as prescribed  3. Are you having a reaction (difficulty breathing--STAT)?   No  4. What is your medication issue?    Patient stated her pharmacist told her she should not take the antibiotic and steriods together as they cause vomitting.  Patient stated she is taking the antibiotics and will start taking the steroid after the course of antibiotics in 10 days. Patient wants to confirm this course of medication is OK.

## 2023-04-07 NOTE — Telephone Encounter (Signed)
Attempted to contact pt. Left message to call back  Charlsie Quest, NP  You1 hour ago (7:56 AM)    The Z-pak only last 5 days, she can start the steroids after she finishes the Z-pak.

## 2023-04-07 NOTE — Telephone Encounter (Signed)
Patient is returning call.  °

## 2023-04-07 NOTE — Telephone Encounter (Signed)
Patient returned RN's call. 

## 2023-04-07 NOTE — Telephone Encounter (Signed)
Left voicemail message to call back  

## 2023-04-07 NOTE — Telephone Encounter (Signed)
Left message on patient's voicemail with a message from the provider as follows:  "The Z-pak only last 5 days, she can start the steroids after she finishes the Z-pak."

## 2023-04-08 ENCOUNTER — Telehealth: Payer: Self-pay

## 2023-04-08 NOTE — Telephone Encounter (Signed)
Transition Care Management Unsuccessful Follow-up Telephone Call  Date of discharge and from where:  04/03/2023, [facility]  Attempts:  1st Attempt  Reason for unsuccessful TCM follow-up call:  Left voice message  Kristalyn Bergstresser [place]  [facility] Population Health Community Resource Care Guide   [email or web address]  ?? [phone]   Website: [email or web address]  [email or web address]      

## 2023-04-11 ENCOUNTER — Telehealth: Payer: Self-pay

## 2023-04-11 NOTE — Telephone Encounter (Signed)
Transition Care Management Unsuccessful Follow-up Telephone Call  Date of discharge and from where:  04/03/2023 [facility]  Attempts:  2nd Attempt  Reason for unsuccessful TCM follow-up call:  Left voice message  Dervin Vore [facility]  [facility] Population Health Community Resource Care Guide   [email or web address]  ?? [phone]   Website: [email or web address]  [email or web address]

## 2023-04-12 ENCOUNTER — Ambulatory Visit: Payer: PPO | Attending: Cardiology

## 2023-04-12 DIAGNOSIS — J90 Pleural effusion, not elsewhere classified: Secondary | ICD-10-CM | POA: Diagnosis not present

## 2023-04-12 LAB — ECHOCARDIOGRAM LIMITED
AR max vel: 2.65 cm2
AV Area VTI: 2.6 cm2
AV Area mean vel: 2.59 cm2
AV Mean grad: 4 mmHg
AV Peak grad: 6.5 mmHg
Ao pk vel: 1.27 m/s
Area-P 1/2: 4.31 cm2
Calc EF: 67.7 %
S' Lateral: 2.3 cm
Single Plane A2C EF: 72.7 %
Single Plane A4C EF: 58.8 %

## 2023-04-13 NOTE — Progress Notes (Signed)
Heart squeeze has improved from 55-60% to 60-65%. No wall motion abnormalities were noted. Mild leakage in the mitral valve that is unchanged. Improved from previous study. No extra fluid around the heart was noted (no pericardial effusion).

## 2023-04-15 ENCOUNTER — Encounter: Payer: Self-pay | Admitting: Cardiology

## 2023-04-15 ENCOUNTER — Ambulatory Visit: Payer: PPO | Attending: Cardiology | Admitting: Cardiology

## 2023-04-15 VITALS — BP 114/68 | HR 69 | Ht 63.0 in | Wt 131.4 lb

## 2023-04-15 DIAGNOSIS — E7800 Pure hypercholesterolemia, unspecified: Secondary | ICD-10-CM

## 2023-04-15 DIAGNOSIS — Z720 Tobacco use: Secondary | ICD-10-CM | POA: Diagnosis not present

## 2023-04-15 DIAGNOSIS — I5181 Takotsubo syndrome: Secondary | ICD-10-CM

## 2023-04-15 DIAGNOSIS — I10 Essential (primary) hypertension: Secondary | ICD-10-CM | POA: Diagnosis not present

## 2023-04-15 DIAGNOSIS — I2510 Atherosclerotic heart disease of native coronary artery without angina pectoris: Secondary | ICD-10-CM

## 2023-04-15 MED ORDER — FUROSEMIDE 20 MG PO TABS: 20.0000 mg | ORAL_TABLET | ORAL | 0 refills | Status: DC | PRN

## 2023-04-15 NOTE — Patient Instructions (Signed)
Medication Instructions:  Your physician has recommended you make the following change in your medication:   furosemide (LASIX) 20 MG tablet - Take 1 tablet (20 mg total) by mouth as needed (for weight gain of 2-3 lbs or shortness of breath).  *If you need a refill on your cardiac medications before your next appointment, please call your pharmacy*   Lab Work: -None ordered If you have labs (blood work) drawn today and your tests are completely normal, you will receive your results only by: MyChart Message (if you have MyChart) OR A paper copy in the mail If you have any lab test that is abnormal or we need to change your treatment, we will call you to review the results.   Testing/Procedures: -None ordered   Follow-Up: At [facility], you and your health needs are our priority.  As part of our continuing mission to provide you with exceptional heart care, we have created designated Provider Care Teams.  These Care Teams include your primary Cardiologist (physician) and Advanced Practice Providers (APPs -  Physician Assistants and Nurse Practitioners) who all work together to provide you with the care you need, when you need it.  We recommend signing up for the patient portal called "MyChart".  Sign up information is provided on this After Visit Summary.  MyChart is used to connect with patients for Virtual Visits (Telemedicine).  Patients are able to view lab/test results, encounter notes, upcoming appointments, etc.  Non-urgent messages can be sent to your provider as well.   To learn more about what you can do with MyChart, go to [email or web address].    Your next appointment:   3 month(s)  Provider:   You may see Julien Nordmann, MD or one of the following Advanced Practice Providers on your designated Care Team:   Nicolasa Ducking, NP Eula Listen, PA-C Cadence Fransico Michael, PA-C Charlsie Quest, NP    Other Instructions -None

## 2023-04-15 NOTE — Progress Notes (Signed)
Cardiology Office Note:   Date:  04/15/2023  ID:  Valerie Donovan, DOB January 22, 1956, MRN [id number]  History of Present Illness:   Valerie Donovan is a 67 y.o. female with past medical history of hyperlipidemia, hypertension, history of smoking 1 pack/day, moderate nonobstructive coronary artery disease per recent left heart catheterization, who is here today to follow-up on recent echocardiogram and discuss questionable small pericardial effusion.  Echocardiogram completed 09/23/2022 revealed LVEF 30-35%, mildly decreased function global hypokinesis with preserved wall motion, G1 DD, mild mitral regurgitation, moderate tricuspid regurgitation, aortic sclerosis without stenosis.  Left heart catheterization completed 09/23/2022 revealed moderate nonobstructive coronary artery disease with 40% proximal LAD and mid RCA stenosis, severely reduced LVEF 25-35%, basal function consistent with Takotsubo cardiomyopathy, mildly elevated left ventricular filling pressure.  Recommendations for diuresis and medical therapy.  Repeat echocardiogram completed 12/25/2022 revealed LVEF 55 to 60%, no regional wall motion abnormalities and no valvular abnormalities.  She was last seen in clinic 03/26/2023 was not feeling well and was concerned because she was recently told the emergency department that she had a pericardial effusion noted on CT scan.  She also had swollen lymph nodes and sinus-like symptoms.  She was given an antibiotic for sinus symptoms, placed on Lasix for 5 days, and prophylactically placed on colchicine in case there was a pericardial effusion that was causing the symptoms that she was having.  She returns to clinic today stating that she has been feeling well.  Since taking antibiotic therapy she has had no further sinus/upper respiratory symptoms.  She continues to have some occasional fatigue, weakness, and weight loss with previous upper respiratory illness that she was having she had inability to  eat at that time.[name]  She was told by her PCP she continued to have microscopic hematuria and has been referred to urology.  Denies any chest pain, shortness of breath, lightheadedness/dizziness, peripheral edema.  Denies any hospitalizations or visits to the emergency department.  ROS: The remainder of the 10 point review of systems is completed and considered negative with exception of what is listed in the HPI  Studies Reviewed:    EKG: No new tracings were completed today  Limited TTE 04/12/23 1. Left ventricular ejection fraction, by estimation, is 60 to 65%. The  left ventricle has normal function. The left ventricle has no regional  wall motion abnormalities. Left ventricular diastolic parameters were  normal. The average left ventricular  global longitudinal strain is -17.0 %.   2. Right ventricular systolic function is normal. The right ventricular  size is normal.   3. The mitral valve is normal in structure. Mild mitral valve  regurgitation. No evidence of mitral stenosis.   4. The aortic valve is normal in structure. Aortic valve regurgitation is  not visualized. Aortic valve sclerosis is present, with no evidence of  aortic valve stenosis. Aortic valve mean gradient measures 4.0 mmHg.   5. The inferior vena cava is normal in size with greater than 50%  respiratory variability, suggesting right atrial pressure of 3 mmHg.   TTE 12/29/22 1. Left ventricular ejection fraction, by estimation, is 55 to 60%. The  left ventricle has normal function. The left ventricle has no regional  wall motion abnormalities. Left ventricular diastolic parameters were  normal.   2. Right ventricular systolic function is normal. The right ventricular  size is normal.   3. The mitral valve is normal in structure. No evidence of mitral valve  regurgitation.   4. The aortic valve is  tricuspid. Aortic valve regurgitation is not  visualized.   5. The inferior vena cava is normal in size with greater  than 50%  respiratory variability, suggesting right atrial pressure of 3 mmHg.    LHC 09/23/22 Conclusions: Moderate, nonobstructive coronary artery disease with 40% proximal LAD and mid RCA stenoses. Severely reduced left ventricular systolic function (LVEF 25-35%) with mid and apical akinesis and preserved basal function consistent with Takotsubo (stress-induced) cardiomyopathy. Mildly elevated left ventricular filling pressure (LVEDP 20 mmHg).   Recommendations: Gentle diuresis and judicious initiation of goal-directed medical therapy as blood pressure and renal function allow. Medical therapy and risk factor modification to prevent progression of moderate, nonobstructive coronary artery disease.  Risk Assessment/Calculations:              Physical Exam:   VS:  BP 114/68 (BP Location: Left Arm, Patient Position: Sitting, Cuff Size: Normal)   Pulse 69   Ht 5\' 3"  (1.6 m)   Wt 131 lb 6 oz (59.6 kg)   LMP  (LMP Unknown)   SpO2 96%   BMI 23.27 kg/m    Wt Readings from Last 3 Encounters:  04/15/23 131 lb 6 oz (59.6 kg)  04/05/23 134 lb (60.8 kg)  04/03/23 131 lb (59.4 kg)     GEN: Well nourished, well developed in no acute distress NECK: No JVD; No carotid bruits CARDIAC: RRR, no murmurs, rubs, gallops RESPIRATORY:  Clear to auscultation without rales, wheezing or rhonchi  ABDOMEN: Soft, non-tender, non-distended EXTREMITIES:  No edema; No deformity   ASSESSMENT AND PLAN:   Nonobstructive coronary artery disease with left heart catheterization completed 05/24/2022 which revealed nonobstructive coronary disease with a Takotsubo cardiomyopathy with 40% proximal LAD and mid RCA stenosis.  She has had no further episodes of chest discomfort or chest pressure.  She has not required the use of Nitrostat.  She continues to decline cardiac rehab at this time.  She has been continued on aspirin 81 mg daily, Imdur 15 mg daily, and atorvastatin 80 mg daily.  Takotsubo cardiomyopathy with  original LVEF of 25-25% final left heart catheterization.  Limited echocardiogram was completed with improved LVEF of 55 to 60%.  She just recently had a repeat study that revealed an LVEF of 60 to 65%, no regional wall motion abnormalities mild mitral regurgitation no pericardial effusion noted.  Essential hypertension with blood pressure today of 114/68.  Blood pressures remained stable.  She is continued on lisinopril 10 mg daily, Imdur 15 mg daily and Toprol-XL 12.5 mg daily.  Hyperlipidemia which she has been continued on atorvastatin LDL was 53 with a goal of less than 70.  She is continued on atorvastatin 80 mg daily.  No changes were made to her medication regimen today.  Previously she was advised there was pericardial effusion noted on CT scan.  Repeat limited echocardiogram revealed there was no pericardial effusion.  Previously ordered colchicine was discontinued.  She has continued to have furosemide at 20 mg on an as-needed basis based on weight gain of 2 to 3 pounds overnight and or shortness of breath.  Tobacco abuse with continued total cessation recommended.  Disposition patient return to clinic to see MD/APP in 3 months or sooner if needed.        Signed, Sriya Kroeze, NP

## 2023-04-19 ENCOUNTER — Other Ambulatory Visit: Payer: Self-pay

## 2023-04-19 ENCOUNTER — Encounter: Payer: Self-pay | Admitting: Cardiovascular Disease

## 2023-04-19 MED ORDER — METOPROLOL SUCCINATE ER 25 MG PO TB24: 12.5000 mg | ORAL_TABLET | Freq: Every day | ORAL | 3 refills | Status: DC

## 2023-04-19 NOTE — Telephone Encounter (Signed)
Error

## 2023-04-20 ENCOUNTER — Encounter: Payer: Self-pay | Admitting: Urology

## 2023-04-20 ENCOUNTER — Other Ambulatory Visit: Payer: Self-pay

## 2023-04-20 ENCOUNTER — Ambulatory Visit (INDEPENDENT_AMBULATORY_CARE_PROVIDER_SITE_OTHER): Payer: PPO | Admitting: Urology

## 2023-04-20 ENCOUNTER — Other Ambulatory Visit
Admission: RE | Admit: 2023-04-20 | Discharge: 2023-04-20 | Disposition: A | Discharge status: Home / Self Care | Payer: PPO | Attending: Urology | Admitting: Urology

## 2023-04-20 VITALS — BP 137/75 | HR 98 | Ht 63.0 in | Wt 129.2 lb

## 2023-04-20 DIAGNOSIS — R3129 Other microscopic hematuria: Secondary | ICD-10-CM | POA: Diagnosis not present

## 2023-04-20 DIAGNOSIS — R319 Hematuria, unspecified: Secondary | ICD-10-CM | POA: Diagnosis not present

## 2023-04-20 LAB — URINALYSIS, COMPLETE (UACMP) WITH MICROSCOPIC
Bilirubin Urine: NEGATIVE
Glucose, UA: NEGATIVE mg/dL
Ketones, ur: NEGATIVE mg/dL
Leukocytes,Ua: NEGATIVE
Nitrite: NEGATIVE
Protein, ur: NEGATIVE mg/dL
Specific Gravity, Urine: 1.01 (ref 1.005–1.030)
pH: 6.5 (ref 5.0–8.0)

## 2023-04-20 NOTE — Progress Notes (Signed)
04/20/23 12:16 PM   Valerie Donovan 12/21/1955 [id number]  CC: Microscopic hematuria, weight loss, fatigue  HPI: 67 year old female referred for microscopic hematuria.  She also reports decreased energy, fatigue, weakness, low-grade fever, 10 pounds of weight loss since March 2024.  She denies any gross hematuria.  She has a 30-pack-year smoking history, continues to smoke cigarettes.  Urinalysis today with 20-50 RBC, few bacteria, 0-5 WBC, 6-10 squamous cells, otherwise benign.  Cultures with PCP have been negative.  PMH: Past Medical History:  Diagnosis Date   Allergy    Anxiety    Arthritis    FINGERS   Cataract    COPD (chronic obstructive pulmonary disease) (HCC)    COPD (chronic obstructive pulmonary disease) (HCC)    Depression    GERD (gastroesophageal reflux disease)    Hyperlipidemia    Hypertension    NSTEMI (non-ST elevated myocardial infarction) (HCC) 09/23/2022   Postmenopausal bleeding 2014; 10/202018; 05/14/18   endometrial bx done 09/2015 - nl; nl paps    Surgical History: Past Surgical History:  Procedure Laterality Date   COLONOSCOPY WITH PROPOFOL N/A 07/20/2019   Procedure: COLONOSCOPY WITH PROPOFOL;  Surgeon: Midge Minium, MD;  Location: [facility];  Service: Endoscopy;  Laterality: N/A;   HYSTEROSCOPY WITH D & C N/A 06/15/2018   Procedure: DILATATION AND CURETTAGE /HYSTEROSCOPY;  Surgeon: Natale Milch, MD;  Location: [facility] ORS;  Service: Gynecology;  Laterality: N/A;   LEFT HEART CATH AND CORONARY ANGIOGRAPHY N/A 09/23/2022   Procedure: LEFT HEART CATH AND CORONARY ANGIOGRAPHY;  Surgeon: Yvonne Kendall, MD;  Location: [facility] INVASIVE CV LAB;  Service: Cardiovascular;  Laterality: N/A;   POLYPECTOMY  07/20/2019   Procedure: POLYPECTOMY;  Surgeon: Midge Minium, MD;  Location: [facility];  Service: Endoscopy;;   TUBAL LIGATION     WISDOM TOOTH EXTRACTION      Family History: Family History  Problem Relation Age of Onset    Cancer Mother    Diabetes Mother    Early death Mother    Cancer Father        colon   Arthritis Father    Diabetes Sister    Diabetes Sister    Diabetes Maternal Grandmother    Diabetes Maternal Grandfather    Breast cancer Neg Hx     Social History:  reports that she has been smoking cigarettes. She has a 22.00 pack-year smoking history. She has never used smokeless tobacco. She reports that she does not drink alcohol and does not use drugs.  Physical Exam: BP 137/75 (BP Location: Left Arm, Patient Position: Sitting, Cuff Size: Normal)   Pulse 98   Ht 5\' 3"  (1.6 m)   Wt 129 lb 3.2 oz (58.6 kg)   LMP  (LMP Unknown)   BMI 22.89 kg/m    Constitutional:  Alert and oriented, No acute distress. Cardiovascular: No clubbing, cyanosis, or edema. Respiratory: Normal respiratory effort, no increased work of breathing. GI: Abdomen is soft, nontender, nondistended, no abdominal masses   Assessment & Plan:   67 year old female with microscopic hematuria as well as a number of other symptoms that may be unrelated including weakness, fatigue, weight loss, and low-grade fever.  We discussed common possible etiologies of microscopic hematuria including idiopathic, urolithiasis, medical renal disease, and malignancy. We discussed the new asymptomatic microscopic hematuria guidelines and risk categories of low, intermediate, and high risk that are based on age, risk factors like smoking, and degree of microscopic hematuria. We discussed work-up can range from repeat  urinalysis, renal ultrasound and cystoscopy, to CT urogram and cystoscopy.  They fall into the high risk category, and I recommended proceeding with CT and cystoscopy.   CT and cystoscopy for completion of gross hematuria workup   Legrand Rams, MD 04/20/2023  [facility] [address], [address] [place], [place] [address] [phone]

## 2023-04-20 NOTE — Patient Instructions (Signed)

## 2023-04-21 ENCOUNTER — Other Ambulatory Visit: Payer: Self-pay

## 2023-04-21 ENCOUNTER — Emergency Department: Payer: PPO

## 2023-04-21 ENCOUNTER — Emergency Department
Admission: EM | Admit: 2023-04-21 | Discharge: 2023-04-21 | Disposition: A | Discharge status: Home / Self Care | Payer: PPO | Attending: Emergency Medicine | Admitting: Emergency Medicine

## 2023-04-21 DIAGNOSIS — K5730 Diverticulosis of large intestine without perforation or abscess without bleeding: Secondary | ICD-10-CM | POA: Diagnosis not present

## 2023-04-21 DIAGNOSIS — N281 Cyst of kidney, acquired: Secondary | ICD-10-CM | POA: Diagnosis not present

## 2023-04-21 DIAGNOSIS — R319 Hematuria, unspecified: Secondary | ICD-10-CM | POA: Insufficient documentation

## 2023-04-21 DIAGNOSIS — R079 Chest pain, unspecified: Secondary | ICD-10-CM | POA: Diagnosis not present

## 2023-04-21 DIAGNOSIS — R0789 Other chest pain: Secondary | ICD-10-CM | POA: Diagnosis not present

## 2023-04-21 DIAGNOSIS — I10 Essential (primary) hypertension: Secondary | ICD-10-CM | POA: Diagnosis not present

## 2023-04-21 DIAGNOSIS — J449 Chronic obstructive pulmonary disease, unspecified: Secondary | ICD-10-CM | POA: Diagnosis not present

## 2023-04-21 LAB — CBC WITH DIFFERENTIAL/PLATELET
Abs Immature Granulocytes: 0.04 10*3/uL (ref 0.00–0.07)
Basophils Absolute: 0 10*3/uL (ref 0.0–0.1)
Basophils Relative: 0 %
Eosinophils Absolute: 0.1 10*3/uL (ref 0.0–0.5)
Eosinophils Relative: 1 %
HCT: 38.7 % (ref 36.0–46.0)
Hemoglobin: 12.6 g/dL (ref 12.0–15.0)
Immature Granulocytes: 0 %
Lymphocytes Relative: 20 %
Lymphs Abs: 2.4 10*3/uL (ref 0.7–4.0)
MCH: 30.1 pg (ref 26.0–34.0)
MCHC: 32.6 g/dL (ref 30.0–36.0)
MCV: 92.4 fL (ref 80.0–100.0)
Monocytes Absolute: 0.9 10*3/uL (ref 0.1–1.0)
Monocytes Relative: 8 %
Neutro Abs: 8.3 10*3/uL — ABNORMAL HIGH (ref 1.7–7.7)
Neutrophils Relative %: 71 %
Platelets: 494 10*3/uL — ABNORMAL HIGH (ref 150–400)
RBC: 4.19 MIL/uL (ref 3.87–5.11)
RDW: 14.6 % (ref 11.5–15.5)
WBC: 11.8 10*3/uL — ABNORMAL HIGH (ref 4.0–10.5)
nRBC: 0 % (ref 0.0–0.2)

## 2023-04-21 LAB — URINALYSIS, COMPLETE (UACMP) WITH MICROSCOPIC
Bilirubin Urine: NEGATIVE
Glucose, UA: NEGATIVE mg/dL
Ketones, ur: NEGATIVE mg/dL
Leukocytes,Ua: NEGATIVE
Nitrite: NEGATIVE
Protein, ur: NEGATIVE mg/dL
Specific Gravity, Urine: 1.006 (ref 1.005–1.030)
pH: 6 (ref 5.0–8.0)

## 2023-04-21 LAB — COMPREHENSIVE METABOLIC PANEL
ALT: 10 U/L (ref 0–44)
AST: 15 U/L (ref 15–41)
Albumin: 3.5 g/dL (ref 3.5–5.0)
Alkaline Phosphatase: 109 U/L (ref 38–126)
Anion gap: 12 (ref 5–15)
BUN: 9 mg/dL (ref 8–23)
CO2: 23 mmol/L (ref 22–32)
Calcium: 9.3 mg/dL (ref 8.9–10.3)
Chloride: 103 mmol/L (ref 98–111)
Creatinine, Ser: 0.72 mg/dL (ref 0.44–1.00)
GFR, Estimated: >60 mL/min (ref >60)
Glucose, Bld: 102 mg/dL — ABNORMAL HIGH (ref 70–99)
Potassium: 3.3 mmol/L — ABNORMAL LOW (ref 3.5–5.1)
Sodium: 138 mmol/L (ref 135–145)
Total Bilirubin: 1 mg/dL (ref 0.3–1.2)
Total Protein: 7.4 g/dL (ref 6.5–8.1)

## 2023-04-21 LAB — TROPONIN I (HIGH SENSITIVITY): Troponin I (High Sensitivity): 6 ng/L (ref <18)

## 2023-04-21 MED ORDER — LORAZEPAM 0.5 MG PO TABS: 0.5000 mg | ORAL_TABLET | Freq: Every day | ORAL | 0 refills | Status: DC | PRN

## 2023-04-21 MED ORDER — LORAZEPAM 2 MG/ML IJ SOLN
1.0000 mg | Freq: Once | INTRAMUSCULAR | Status: AC
  Administered 2023-04-21: 1 mg via INTRAVENOUS
  Filled 2023-04-21: qty 1

## 2023-04-21 MED ORDER — SODIUM CHLORIDE 0.9 % IV BOLUS
1000.0000 mL | Freq: Once | INTRAVENOUS | Status: AC
  Administered 2023-04-21: 1000 mL via INTRAVENOUS

## 2023-04-21 NOTE — ED Notes (Signed)
Patient transported to CT 

## 2023-04-21 NOTE — Discharge Instructions (Signed)
Please follow-up with your urologist as scheduled.  You have been prescribed an anxiety medication.  Please only use this medication if needed as written.  Do not drink alcohol or drive while taking this medication.  Please follow-up with your doctor or call the number provided for psychiatry/[facility] to arrange a follow-up appointment to discuss your symptoms further and for any long-term medications that could be helpful.  Return to the emergency department for any symptoms personally concerning to yourself.

## 2023-04-21 NOTE — ED Triage Notes (Signed)
Patient arrives c/o chest pain. States she has a pleural effusion to right side.  Has been receiving treatment but pain not improving.  Patient also states she is bleeding from her kidney.  STates has had weight loss since March.  Patient is emotional in Triage.  States "I can't eat. I can't sleep".

## 2023-04-21 NOTE — ED Provider Notes (Signed)
[facility] Provider Note    Event Date/Time   First MD Initiated Contact with Patient 04/21/23 0840     (approximate)  History   Chief Complaint: Hematuria  HPI  Valerie Donovan is a 67 y.o. female with a past medical history of anxiety, COPD, gastric reflux, hypertension, hyperlipidemia, NSTEMI 2023, presents to the emergency department with multiple complaints.  Patient arrives complaining of right-sided chest pain due to her "pleural effusion" states it feels like it cuts off for air sometimes at night.  Patient also states she has blood in her urine she saw her urologist yesterday who has a CT scan scheduled but she was so worried that she could not sleep last night.  Here the patient is actively crying tearful saying she has not been able to eat she cannot sleep, states she needs to be admitted to the hospital to figure out what is going on in her body.  States she is concerned that she has an infection somewhere in her body ever since March of this year.  Patient states she has had low-grade fevers since March as well.  Afebrile currently.  Denies any abdominal pain.  Physical Exam   Triage Vital Signs: ED Triage Vitals  Enc Vitals Group     BP 04/21/23 0835 (!) 140/88     Pulse Rate 04/21/23 0835 (!) 116     Resp 04/21/23 0835 18     Temp 04/21/23 0835 99.1 F (37.3 C)     Temp Source 04/21/23 0835 Oral     SpO2 04/21/23 0835 95 %     Weight 04/21/23 0833 129 lb 3 oz (58.6 kg)     Height 04/21/23 0833 5\' 3"  (1.6 m)     Head Circumference --      Peak Flow --      Pain Score 04/21/23 0833 6     Pain Loc --      Pain Edu? --      Excl. in GC? --     Most recent vital signs: Vitals:   04/21/23 0835 04/21/23 0900  BP: (!) 140/88 124/77  Pulse: (!) 116 (!) 101  Resp: 18 15  Temp: 99.1 F (37.3 C)   SpO2: 95% 100%    General: Awake, actively crying, anxious. CV:  Good peripheral perfusion.  Regular rate and rhythm  Resp:  Normal effort.   Equal breath sounds bilaterally.  Abd:  No distention.  Soft, nontender.  No rebound or guarding.  ED Results / Procedures / Treatments   EKG  EKG viewed and interpreted by myself shows sinus tachycardia 107 bpm with a narrow QRS, normal axis, normal intervals, nonspecific ST changes.  RADIOLOGY  I have reviewed and interpreted chest x-ray images.  No consolidation seen on my evaluation. Radiology is read the x-ray as negative. CT scan essentially read as negative for acute abnormality besides mild pericardial effusion unchanged.   MEDICATIONS ORDERED IN ED: Medications  sodium chloride 0.9 % bolus 1,000 mL (has no administration in time range)  LORazepam (ATIVAN) injection 1 mg (has no administration in time range)     IMPRESSION / MDM / ASSESSMENT AND PLAN / ED COURSE  I reviewed the triage vital signs and the nursing notes.  Patient's presentation is most consistent with acute presentation with potential threat to life or bodily function.  Patient presents emergency department very tearful very anxious appearing with multiple concerns.  Patient's major issue she states since March she has  been feeling fatigued and weak she cannot eat she cannot sleep and is concerned that there is something significant going on within her body causing the symptoms.  Patient states she has been followed for microscopic hematuria for months or longer finally saw urology yesterday who ordered a CT scan as well as a cystoscopy.  Patient admits she has been very worried about that she has been tearful.  Patient believes there is some type of infection in her body something that has been going on since March that is causing her to feel this way.  We will check labs, with the patient's right-sided chest pain complaint will obtain a troponin as well.  I personally saw the patient in the ER several weeks ago for similar vague complaints including chest pain and hematuria.  Patient had a negative workup  including a CT scan of the chest at that time and lab work.  Patient is following up with her doctor she is also following up with urology.  I had a long discussion with the patient regarding her symptoms and anxiety.  While I am not ruling out a medical cause for the patient's symptoms I do believe anxiety is playing a significant role in the patient's current presentation and likely contributing to her lack of sleep and eating.  Will dose a small amount of anxiety medication in the emergency department we will IV hydrate while awaiting lab results chest x-ray and a CT renal scan.  Patient agreeable to plan of care.  Workup is reassuring.  No blood on microscopy of urinalysis.  Patient CBC and chemistry reassuring, troponin negative.  CT scan negative, chest x-ray is clear.  After Ativan patient is now resting comfortably awakens easily to voice and answers questions much more calm only.  Highly suspect anxiety is playing a major role in the patient's current state of health she is getting an appropriate workup she is following up with urology has a cystoscopy scheduled for next month.  Discussed with the patient recommendation to follow-up with her doctor I will also refer to [facility] if the patient would like to speak to behavioral health/psychiatry.  I will prescribe a short course of Ativan for the patient to use if needed for anxiety/sleep.  Patient agreeable to plan.  FINAL CLINICAL IMPRESSION(S) / ED DIAGNOSES   Hematuria Chest pain   Note:  This document was prepared using Dragon voice recognition software and may include unintentional dictation errors.   Minna Antis, MD 04/21/23 1126

## 2023-04-22 ENCOUNTER — Telehealth: Payer: Self-pay

## 2023-04-22 NOTE — Telephone Encounter (Signed)
Spoke with pt. Pt. Advised that she does not need the CT with Dr. Richardo Hanks. She will keep follow up scheduled.

## 2023-04-22 NOTE — Telephone Encounter (Signed)
Patient called in and states that she ended up in the ER yesterday and they did a CT. She wanted to know if she still needs to schedule the one you ordered? She states that [facility] advised her to give [place] a call.

## 2023-04-26 ENCOUNTER — Telehealth: Payer: Self-pay | Admitting: *Deleted

## 2023-04-26 NOTE — Telephone Encounter (Signed)
Transition Care Management Unsuccessful Follow-up Telephone Call  Date of discharge and from where:  [facility] 5/16  Attempts:  1st Attempt  Reason for unsuccessful TCM follow-up call:  Left voice message

## 2023-04-28 ENCOUNTER — Telehealth: Payer: Self-pay | Admitting: *Deleted

## 2023-04-28 NOTE — Telephone Encounter (Signed)
Transition Care Management Unsuccessful Follow-up Telephone Call  Date of discharge and from where:  [facility] 04/21/2023  Attempts:  2nd Attempt  Reason for unsuccessful TCM follow-up call:  Left voice message

## 2023-04-30 ENCOUNTER — Other Ambulatory Visit: Payer: Self-pay | Admitting: Nurse Practitioner

## 2023-04-30 ENCOUNTER — Other Ambulatory Visit: Payer: Self-pay | Admitting: Cardiology

## 2023-05-03 NOTE — Telephone Encounter (Signed)
Pt called to check status of refill, states she is running low and need a refill for 90 days.

## 2023-05-04 ENCOUNTER — Ambulatory Visit: Payer: PPO | Admitting: Nurse Practitioner

## 2023-05-18 ENCOUNTER — Encounter: Payer: Self-pay | Admitting: Urology

## 2023-05-18 ENCOUNTER — Ambulatory Visit (INDEPENDENT_AMBULATORY_CARE_PROVIDER_SITE_OTHER): Payer: PPO | Admitting: Urology

## 2023-05-18 VITALS — BP 143/73 | HR 52 | Ht 63.0 in | Wt 129.0 lb

## 2023-05-18 DIAGNOSIS — R3129 Other microscopic hematuria: Secondary | ICD-10-CM

## 2023-05-18 DIAGNOSIS — R319 Hematuria, unspecified: Secondary | ICD-10-CM

## 2023-05-18 MED ORDER — NITROFURANTOIN MACROCRYSTAL 50 MG PO CAPS
100.0000 mg | ORAL_CAPSULE | Freq: Once | ORAL | Status: AC
Start: 2023-05-18 — End: 2023-05-18
  Administered 2023-05-18: 100 mg via ORAL

## 2023-05-18 MED ORDER — NITROFURANTOIN MACROCRYSTAL 50 MG PO CAPS
100.0000 mg | ORAL_CAPSULE | Freq: Once | ORAL | Status: DC
Start: 2023-05-18 — End: 2023-05-18

## 2023-05-18 NOTE — Progress Notes (Signed)
Cystoscopy Procedure Note:  Indication: Microscopic hematuria  Nitrofurantoin given for prophylaxis  After informed consent and discussion of the procedure and its risks, Valerie Donovan was positioned and prepped in the standard fashion. Cystoscopy was performed with a flexible cystoscope. The urethra, bladder neck and entire bladder was visualized in a standard fashion.The ureteral orifices were visualized in their normal location and orientation.  Bladder mucosa grossly normal throughout, no abnormalities on retroflexion  Imaging: CT stone protocol 04/21/2023 with no urologic abnormalities  Findings: Normal cystoscopy  Assessment and Plan: Follow-up with urology as needed  Legrand Rams, MD 05/18/2023

## 2023-05-18 NOTE — Progress Notes (Signed)
01

## 2023-05-23 ENCOUNTER — Ambulatory Visit (INDEPENDENT_AMBULATORY_CARE_PROVIDER_SITE_OTHER): Payer: PPO | Admitting: Nurse Practitioner

## 2023-05-23 ENCOUNTER — Encounter: Payer: Self-pay | Admitting: Nurse Practitioner

## 2023-05-23 VITALS — BP 128/86 | HR 49 | Temp 97.9°F | Wt 130.4 lb

## 2023-05-23 DIAGNOSIS — J449 Chronic obstructive pulmonary disease, unspecified: Secondary | ICD-10-CM

## 2023-05-23 DIAGNOSIS — E7800 Pure hypercholesterolemia, unspecified: Secondary | ICD-10-CM | POA: Diagnosis not present

## 2023-05-23 DIAGNOSIS — E538 Deficiency of other specified B group vitamins: Secondary | ICD-10-CM

## 2023-05-23 DIAGNOSIS — E559 Vitamin D deficiency, unspecified: Secondary | ICD-10-CM

## 2023-05-23 DIAGNOSIS — F418 Other specified anxiety disorders: Secondary | ICD-10-CM | POA: Diagnosis not present

## 2023-05-23 DIAGNOSIS — I10 Essential (primary) hypertension: Secondary | ICD-10-CM

## 2023-05-23 MED ORDER — METOPROLOL SUCCINATE ER 25 MG PO TB24: 12.5000 mg | ORAL_TABLET | Freq: Every day | ORAL | 1 refills | Status: DC

## 2023-05-23 MED ORDER — ATORVASTATIN CALCIUM 40 MG PO TABS: 40.0000 mg | ORAL_TABLET | Freq: Every day | ORAL | 1 refills | Status: DC

## 2023-05-23 MED ORDER — FLUOXETINE HCL 40 MG PO CAPS: 40.0000 mg | ORAL_CAPSULE | Freq: Every day | ORAL | 1 refills | Status: DC

## 2023-05-23 MED ORDER — ISOSORBIDE MONONITRATE ER 30 MG PO TB24: 30.0000 mg | ORAL_TABLET | Freq: Every day | ORAL | 1 refills | Status: DC

## 2023-05-23 NOTE — Assessment & Plan Note (Signed)
Labs ordered at visit today.  Will make recommendations based on lab results.   

## 2023-05-23 NOTE — Assessment & Plan Note (Signed)
Chronic.  Controlled.  Continue with current medication regimen of Prozac daily.  Refills sent today.  Labs ordered today.  Return to clinic in 4 months for reevaluation.  Call sooner if concerns arise.

## 2023-05-23 NOTE — Assessment & Plan Note (Signed)
Chronic.  Controlled.  Continue with current medication regimen of Imdur and Metoprolol.  Refills sent today.  Labs ordered today.  Return to clinic in 4 months for reevaluation.  Call sooner if concerns arise.

## 2023-05-23 NOTE — Assessment & Plan Note (Signed)
Chronic.  Controlled.  Continue with current medication regimen.  Labs ordered today.  Return to clinic in 4 months for reevaluation.  Call sooner if concerns arise.   

## 2023-05-23 NOTE — Progress Notes (Signed)
BP 128/86 Comment: home blood pressure reading  Pulse (!) 49   Temp 97.9 F (36.6 C) (Oral)   Wt 130 lb 6.4 oz (59.1 kg)   LMP  (LMP Unknown)   SpO2 96%   BMI 23.10 kg/m    Subjective:    Patient ID: Valerie Donovan, female    DOB: 1956/12/01, 67 y.o.   MRN: [id number]  HPI: Valerie Donovan is a 67 y.o. female  Chief Complaint  Patient presents with   COPD   HYPERTENSION / HYPERLIPIDEMIA Satisfied with current treatment? no Duration of hypertension: years BP monitoring frequency:  BP range: 120-130/80 BP medication side effects: no Past BP meds: lisinopril Duration of hyperlipidemia: years Cholesterol medication side effects: no Cholesterol supplements: none Past cholesterol medications: atorvastain (lipitor) Medication compliance: excellent compliance Aspirin: no Recent stressors: no Recurrent headaches: no Visual changes: no Palpitations: no Dyspnea: yes Chest pain: no Lower extremity edema: no Dizzy/lightheaded: no  ANXIETY Patient states she is doing well.  Feels like the Prozac is working well for her.  Denies Concerns regarding visit today.    COPD COPD status: stable Satisfied with current treatment?: yes Oxygen use: no Dyspnea frequency:  Cough frequency:  Rescue inhaler frequency:   Limitation of activity: yes Productive cough:  Last Spirometry:  Pneumovax: Up to Date Influenza: Up to Date   Relevant past medical, surgical, family and social history reviewed and updated as indicated. Interim medical history since our last visit reviewed. Allergies and medications reviewed and updated.  Review of Systems  Eyes:  Negative for visual disturbance.  Respiratory:  Positive for shortness of breath. Negative for cough and chest tightness.   Cardiovascular:  Negative for chest pain, palpitations and leg swelling.  Neurological:  Negative for dizziness and headaches.  Psychiatric/Behavioral:  Positive for dysphoric mood. Negative for suicidal  ideas. The patient is nervous/anxious.     Per HPI unless specifically indicated above     Objective:    BP 128/86 Comment: home blood pressure reading  Pulse (!) 49   Temp 97.9 F (36.6 C) (Oral)   Wt 130 lb 6.4 oz (59.1 kg)   LMP  (LMP Unknown)   SpO2 96%   BMI 23.10 kg/m   Wt Readings from Last 3 Encounters:  05/23/23 130 lb 6.4 oz (59.1 kg)  05/18/23 129 lb (58.5 kg)  04/21/23 129 lb 3 oz (58.6 kg)    Physical Exam Vitals and nursing note reviewed.  Constitutional:      General: She is not in acute distress.    Appearance: Normal appearance. She is normal weight. She is not ill-appearing, toxic-appearing or diaphoretic.  HENT:     Head: Normocephalic.     Right Ear: External ear normal.     Left Ear: External ear normal.     Nose: Nose normal.     Mouth/Throat:     Mouth: Mucous membranes are moist.     Pharynx: Oropharynx is clear.  Eyes:     General:        Right eye: No discharge.        Left eye: No discharge.     Extraocular Movements: Extraocular movements intact.     Conjunctiva/sclera: Conjunctivae normal.     Pupils: Pupils are equal, round, and reactive to light.  Cardiovascular:     Rate and Rhythm: Normal rate and regular rhythm.     Heart sounds: No murmur heard. Pulmonary:     Effort: Pulmonary effort is normal. No  respiratory distress.     Breath sounds: Normal breath sounds. No wheezing or rales.  Musculoskeletal:     Cervical back: Normal range of motion and neck supple.  Skin:    General: Skin is warm and dry.     Capillary Refill: Capillary refill takes less than 2 seconds.  Neurological:     General: No focal deficit present.     Mental Status: She is alert and oriented to person, place, and time. Mental status is at baseline.  Psychiatric:        Mood and Affect: Mood normal.        Behavior: Behavior normal.        Thought Content: Thought content normal.        Judgment: Judgment normal.     Results for orders placed or  performed during the hospital encounter of 04/21/23  CBC with Differential  Result Value Ref Range   WBC 11.8 (H) 4.0 - 10.5 K/uL   RBC 4.19 3.87 - 5.11 MIL/uL   Hemoglobin 12.6 12.0 - 15.0 g/dL   HCT 16.1 09.6 - 04.5 %   MCV 92.4 80.0 - 100.0 fL   MCH 30.1 26.0 - 34.0 pg   MCHC 32.6 30.0 - 36.0 g/dL   RDW 40.9 81.1 - 91.4 %   Platelets 494 (H) 150 - 400 K/uL   nRBC 0.0 0.0 - 0.2 %   Neutrophils Relative % 71 %   Neutro Abs 8.3 (H) 1.7 - 7.7 K/uL   Lymphocytes Relative 20 %   Lymphs Abs 2.4 0.7 - 4.0 K/uL   Monocytes Relative 8 %   Monocytes Absolute 0.9 0.1 - 1.0 K/uL   Eosinophils Relative 1 %   Eosinophils Absolute 0.1 0.0 - 0.5 K/uL   Basophils Relative 0 %   Basophils Absolute 0.0 0.0 - 0.1 K/uL   Immature Granulocytes 0 %   Abs Immature Granulocytes 0.04 0.00 - 0.07 K/uL  Comprehensive metabolic panel  Result Value Ref Range   Sodium 138 135 - 145 mmol/L   Potassium 3.3 (L) 3.5 - 5.1 mmol/L   Chloride 103 98 - 111 mmol/L   CO2 23 22 - 32 mmol/L   Glucose, Bld 102 (H) 70 - 99 mg/dL   BUN 9 8 - 23 mg/dL   Creatinine, Ser 7.82 0.44 - 1.00 mg/dL   Calcium 9.3 8.9 - 95.6 mg/dL   Total Protein 7.4 6.5 - 8.1 g/dL   Albumin 3.5 3.5 - 5.0 g/dL   AST 15 15 - 41 U/L   ALT 10 0 - 44 U/L   Alkaline Phosphatase 109 38 - 126 U/L   Total Bilirubin 1.0 0.3 - 1.2 mg/dL   GFR, Estimated >21 >30 mL/min   Anion gap 12 5 - 15  Urinalysis, Complete w Microscopic -Urine, Clean Catch  Result Value Ref Range   Color, Urine YELLOW (A) YELLOW   APPearance HAZY (A) CLEAR   Specific Gravity, Urine 1.006 1.005 - 1.030   pH 6.0 5.0 - 8.0   Glucose, UA NEGATIVE NEGATIVE mg/dL   Hgb urine dipstick MODERATE (A) NEGATIVE   Bilirubin Urine NEGATIVE NEGATIVE   Ketones, ur NEGATIVE NEGATIVE mg/dL   Protein, ur NEGATIVE NEGATIVE mg/dL   Nitrite NEGATIVE NEGATIVE   Leukocytes,Ua NEGATIVE NEGATIVE   RBC / HPF 0-5 0 - 5 RBC/hpf   WBC, UA 0-5 0 - 5 WBC/hpf   Bacteria, UA RARE (A) NONE SEEN    Squamous Epithelial / HPF 0-5 0 -  5 /HPF   Mucus PRESENT   Troponin I (High Sensitivity)  Result Value Ref Range   Troponin I (High Sensitivity) 6 <18 ng/L      Assessment & Plan:   Problem List Items Addressed This Visit       Cardiovascular and Mediastinum   Hypertension - Primary    Chronic.  Controlled.  Continue with current medication regimen of Imdur and Metoprolol.  Refills sent today.  Labs ordered today.  Return to clinic in 4 months for reevaluation.  Call sooner if concerns arise.        Relevant Medications   atorvastatin (LIPITOR) 40 MG tablet   isosorbide mononitrate (IMDUR) 30 MG 24 hr tablet   metoprolol succinate (TOPROL-XL) 25 MG 24 hr tablet   Other Relevant Orders   Comp Met (CMET)     Respiratory   COPD (chronic obstructive pulmonary disease) (HCC)    Chronic.  Controlled.  Continue with current medication regimen.  Labs ordered today.  Return to clinic in 4 months for reevaluation.  Call sooner if concerns arise.          Other   Hyperlipidemia, unspecified    Chronic.  Controlled.  Continue with current medication regimen of Atorvastatin daily.  Labs ordered today.  Return to clinic in 4 months for reevaluation.  Call sooner if concerns arise.        Relevant Medications   atorvastatin (LIPITOR) 40 MG tablet   isosorbide mononitrate (IMDUR) 30 MG 24 hr tablet   metoprolol succinate (TOPROL-XL) 25 MG 24 hr tablet   Other Relevant Orders   Lipid Profile   Vitamin D deficiency    Labs ordered at visit today.  Will make recommendations based on lab results.        Relevant Orders   Vitamin D (25 hydroxy)   Vitamin B12 deficiency    Labs ordered at visit today.  Will make recommendations based on lab results.        Relevant Orders   B12   Depression with anxiety    Chronic.  Controlled.  Continue with current medication regimen of Prozac daily.  Refills sent today.  Labs ordered today.  Return to clinic in 4 months for reevaluation.  Call  sooner if concerns arise.       Relevant Medications   FLUoxetine (PROZAC) 40 MG capsule     Follow up plan: Return in about 4 months (around 09/22/2023) for HTN, HLD, DM2 FU.

## 2023-05-23 NOTE — Assessment & Plan Note (Signed)
Chronic.  Controlled.  Continue with current medication regimen of Atorvastatin daily.  Labs ordered today.  Return to clinic in 4 months for reevaluation.  Call sooner if concerns arise.    

## 2023-05-24 LAB — COMPREHENSIVE METABOLIC PANEL
ALT: 12 IU/L (ref 0–32)
AST: 11 IU/L (ref 0–40)
Albumin: 4 g/dL (ref 3.9–4.9)
Alkaline Phosphatase: 133 IU/L — ABNORMAL HIGH (ref 44–121)
BUN/Creatinine Ratio: 10 — ABNORMAL LOW (ref 12–28)
BUN: 7 mg/dL — ABNORMAL LOW (ref 8–27)
Bilirubin Total: 0.3 mg/dL (ref 0.0–1.2)
CO2: 25 mmol/L (ref 20–29)
Calcium: 9.6 mg/dL (ref 8.7–10.3)
Chloride: 105 mmol/L (ref 96–106)
Creatinine, Ser: 0.72 mg/dL (ref 0.57–1.00)
Globulin, Total: 2.3 g/dL (ref 1.5–4.5)
Glucose: 82 mg/dL (ref 70–99)
Potassium: 4.5 mmol/L (ref 3.5–5.2)
Sodium: 142 mmol/L (ref 134–144)
Total Protein: 6.3 g/dL (ref 6.0–8.5)
eGFR: 90 mL/min/{1.73_m2} (ref >59)

## 2023-05-24 LAB — VITAMIN B12: Vitamin B-12: 384 pg/mL (ref 232–1245)

## 2023-05-24 LAB — LIPID PANEL
Chol/HDL Ratio: 3.4 ratio (ref 0.0–4.4)
Cholesterol, Total: 121 mg/dL (ref 100–199)
HDL: 36 mg/dL — ABNORMAL LOW (ref >39)
LDL Chol Calc (NIH): 65 mg/dL (ref 0–99)
Triglycerides: 106 mg/dL (ref 0–149)
VLDL Cholesterol Cal: 20 mg/dL (ref 5–40)

## 2023-05-24 LAB — VITAMIN D 25 HYDROXY (VIT D DEFICIENCY, FRACTURES): Vit D, 25-Hydroxy: 41.9 ng/mL (ref 30.0–100.0)

## 2023-05-24 NOTE — Progress Notes (Signed)
Hi Ms. Valerie Donovan. It was nice to see you yesterday.  Your lab work looks good.  No concerns at this time. Continue with your current medication regimen.  Follow up as discussed.  Please let me know if you have any questions.

## 2023-05-28 ENCOUNTER — Other Ambulatory Visit: Payer: Self-pay | Admitting: Nurse Practitioner

## 2023-05-30 NOTE — Telephone Encounter (Signed)
Requested Prescriptions  Refused Prescriptions Disp Refills   busPIRone (BUSPAR) 5 MG tablet [Pharmacy Med Name: BUSPIRONE 5MG  TABLETS] 180 tablet 1    Sig: TAKE 1 TABLET(5 MG) BY MOUTH TWICE DAILY     Psychiatry: Anxiolytics/Hypnotics - Non-controlled Passed - 05/28/2023  7:03 AM      Passed - Valid encounter within last 12 months    Recent Outpatient Visits           1 week ago Primary hypertension   [facility] Larae Grooms, NP   2 months ago Shortness of breath   [facility] Larae Grooms, NP   2 months ago Fatigue due to depression   [place] [facility] Pearley, Sherran Needs, NP   6 months ago Depression with anxiety   [place] [facility] Larae Grooms, NP   8 months ago Hospital discharge follow-up   [facility] Larae Grooms, NP       Future Appointments             In 3 months Judithann Graves, Nyoka Cowden, MD [facility] at [facility]   In 3 months Larae Grooms, NP [facility]

## 2023-06-07 DIAGNOSIS — I10 Essential (primary) hypertension: Secondary | ICD-10-CM | POA: Diagnosis not present

## 2023-06-07 DIAGNOSIS — I5181 Takotsubo syndrome: Secondary | ICD-10-CM | POA: Diagnosis not present

## 2023-06-07 DIAGNOSIS — Z720 Tobacco use: Secondary | ICD-10-CM | POA: Diagnosis not present

## 2023-06-07 DIAGNOSIS — I2510 Atherosclerotic heart disease of native coronary artery without angina pectoris: Secondary | ICD-10-CM | POA: Diagnosis not present

## 2023-06-07 DIAGNOSIS — E7849 Other hyperlipidemia: Secondary | ICD-10-CM | POA: Diagnosis not present

## 2023-06-07 DIAGNOSIS — I252 Old myocardial infarction: Secondary | ICD-10-CM | POA: Diagnosis not present

## 2023-07-15 ENCOUNTER — Ambulatory Visit: Payer: PPO | Admitting: Cardiology

## 2023-09-07 ENCOUNTER — Encounter: Payer: Self-pay | Admitting: Internal Medicine

## 2023-09-07 ENCOUNTER — Ambulatory Visit (INDEPENDENT_AMBULATORY_CARE_PROVIDER_SITE_OTHER): Payer: PPO | Admitting: Internal Medicine

## 2023-09-07 VITALS — BP 124/70 | HR 57 | Ht 63.0 in | Wt 125.0 lb

## 2023-09-07 DIAGNOSIS — I10 Essential (primary) hypertension: Secondary | ICD-10-CM

## 2023-09-07 DIAGNOSIS — F418 Other specified anxiety disorders: Secondary | ICD-10-CM

## 2023-09-07 DIAGNOSIS — F17200 Nicotine dependence, unspecified, uncomplicated: Secondary | ICD-10-CM | POA: Diagnosis not present

## 2023-09-07 DIAGNOSIS — J439 Emphysema, unspecified: Secondary | ICD-10-CM | POA: Diagnosis not present

## 2023-09-07 MED ORDER — BUSPIRONE HCL 7.5 MG PO TABS
15.0000 mg | ORAL_TABLET | Freq: Two times a day (BID) | ORAL | 2 refills | Status: DC
Start: 2023-09-07 — End: 2023-09-12

## 2023-09-07 NOTE — Progress Notes (Signed)
Date:  09/07/2023   Name:  Valerie Donovan   DOB:  [birth year]   MRN:  [id number]   Chief Complaint: New Patient (Initial Visit) and Anxiety (Wants to discuss anxiety. Patient takes Prozac 40 mg daily.)  Anxiety Presents for follow-up visit. Symptoms include excessive worry, irritability and nervous/anxious behavior. Patient reports no chest pain, shortness of breath or suicidal ideas. Symptoms occur most days. The severity of symptoms is moderate. The quality of sleep is good. Nighttime awakenings: occasional.   Compliance with medications is 76-100% (has been on Prozac for years.).  Tobacco - continues to smoke daily.  Lab Results  Component Value Date   NA 142 05/23/2023   K 4.5 05/23/2023   CO2 25 05/23/2023   GLUCOSE 82 05/23/2023   BUN 7 (L) 05/23/2023   CREATININE 0.72 05/23/2023   CALCIUM 9.6 05/23/2023   EGFR 90 05/23/2023   GFRNONAA >60 04/21/2023   Lab Results  Component Value Date   CHOL 121 05/23/2023   HDL 36 (L) 05/23/2023   LDLCALC 65 05/23/2023   TRIG 106 05/23/2023   CHOLHDL 3.4 05/23/2023   Lab Results  Component Value Date   TSH 1.620 03/10/2023   Lab Results  Component Value Date   HGBA1C 5.7 (H) 09/23/2022   Lab Results  Component Value Date   WBC 11.8 (H) 04/21/2023   HGB 12.6 04/21/2023   HCT 38.7 04/21/2023   MCV 92.4 04/21/2023   PLT 494 (H) 04/21/2023   Lab Results  Component Value Date   ALT 12 05/23/2023   AST 11 05/23/2023   ALKPHOS 133 (H) 05/23/2023   BILITOT 0.3 05/23/2023   Lab Results  Component Value Date   VD25OH 41.9 05/23/2023     Review of Systems  Constitutional:  Positive for irritability.  Respiratory:  Negative for chest tightness and shortness of breath.   Cardiovascular:  Negative for chest pain.  Psychiatric/Behavioral:  Positive for dysphoric mood. Negative for sleep disturbance and suicidal ideas. The patient is nervous/anxious.     Patient Active Problem List   Diagnosis Date Noted   COPD  (chronic obstructive pulmonary disease) (HCC)    Depression with anxiety    Coronary artery disease involving native coronary artery of native heart    Current every day smoker 01/21/2021   GERD (gastroesophageal reflux disease)    History of colonic polyps    Polyp of descending colon    Vitamin D deficiency 12/21/2017   Vitamin B12 deficiency 12/21/2017   Primary hypertension 08/05/2014   Mixed hyperlipidemia 08/05/2014    Allergies  Allergen Reactions   Ciprofloxacin Anaphylaxis   Clarithromycin Anaphylaxis   Other Anaphylaxis    ANTIBIOTICS-PT CAN TOLERATE Z-PAC AND DOXYCYCLINE BUT ALL OTHERS SHE CANNOT   Penicillin G Anaphylaxis    Has patient had a PCN reaction causing immediate rash, facial/tongue/throat swelling, SOB or lightheadedness with hypotension: Yes Has patient had a PCN reaction causing severe rash involving mucus membranes or skin necrosis: No Has patient had a PCN reaction that required hospitalization: No Has patient had a PCN reaction occurring within the last 10 years: No If all of the above answers are "NO", then may proceed with Cephalosporin use.    Bactrim [Sulfamethoxazole-Trimethoprim]    Codeine Itching    vomiting   Fluoride Preparations Hives   Trazodone Itching    Past Surgical History:  Procedure Laterality Date   COLONOSCOPY WITH PROPOFOL N/A 07/20/2019   Procedure: COLONOSCOPY WITH PROPOFOL;  Surgeon: Midge Minium,  MD;  Location: [facility];  Service: Endoscopy;  Laterality: N/A;   HYSTEROSCOPY WITH D & C N/A 06/15/2018   Procedure: DILATATION AND CURETTAGE /HYSTEROSCOPY;  Surgeon: Natale Milch, MD;  Location: [facility];  Service: Gynecology;  Laterality: N/A;   LEFT HEART CATH AND CORONARY ANGIOGRAPHY N/A 09/23/2022   Procedure: LEFT HEART CATH AND CORONARY ANGIOGRAPHY;  Surgeon: Yvonne Kendall, MD;  Location: [facility];  Service: Cardiovascular;  Laterality: N/A;   POLYPECTOMY  07/20/2019   Procedure:  POLYPECTOMY;  Surgeon: Midge Minium, MD;  Location: [facility];  Service: Endoscopy;;   TUBAL LIGATION     WISDOM TOOTH EXTRACTION      Social History   Tobacco Use   Smoking status: Every Day    Current packs/day: 0.50    Average packs/day: 0.5 packs/day for 44.0 years (22.0 ttl pk-yrs)    Types: Cigarettes    Passive exposure: Current   Smokeless tobacco: Never  Vaping Use   Vaping status: Former  Substance Use Topics   Alcohol use: Never   Drug use: Never    Comment: CBD OIL IN JUUL     Medication list has been reviewed and updated.  Current Meds  Medication Sig   aspirin 81 MG chewable tablet Chew 1 tablet (81 mg total) by mouth daily.   atorvastatin (LIPITOR) 40 MG tablet Take 1 tablet (40 mg total) by mouth daily.   busPIRone (BUSPAR) 7.5 MG tablet Take 2 tablets (15 mg total) by mouth 2 (two) times daily.   EPINEPHrine (EPIPEN 2-PAK) 0.3 mg/0.3 mL IJ SOAJ injection Inject 0.3 mg into the muscle as needed for anaphylaxis.   FLUoxetine (PROZAC) 40 MG capsule Take 1 capsule (40 mg total) by mouth daily.   fluticasone (FLONASE) 50 MCG/ACT nasal spray Place 2 sprays into both nostrils daily.   lisinopril (ZESTRIL) 10 MG tablet Take 10 mg by mouth daily.   Melatonin 10-10 MG TBCR Take by mouth.   metoprolol succinate (TOPROL-XL) 25 MG 24 hr tablet Take 0.5 tablets (12.5 mg total) by mouth at bedtime.   nitroGLYCERIN (NITROSTAT) 0.4 MG SL tablet Place 1 tablet (0.4 mg total) under the tongue every 5 (five) minutes as needed for chest pain.   omeprazole (PRILOSEC) 20 MG capsule Take 1 capsule (20 mg total) by mouth daily as needed (for acid reflux/indigestion.).       09/07/2023    2:06 PM 05/23/2023    8:10 AM 03/10/2023   10:04 AM 11/03/2022    9:10 AM  GAD 7 : Generalized Anxiety Score  Nervous, Anxious, on Edge 2 0 0 0  Control/stop worrying 2 0 0 0  Worry too much - different things 2 0 0 0  Trouble relaxing 2 0 0 0  Restless 1 0 0 0  Easily annoyed or  irritable 2 0 0 0  Afraid - awful might happen 0 0 0 0  Total GAD 7 Score 11 0 0 0  Anxiety Difficulty Very difficult Not difficult at all Not difficult at all Not difficult at all       09/07/2023    2:06 PM 05/23/2023    8:10 AM 03/10/2023   10:03 AM  Depression screen PHQ 2/9  Decreased Interest 2 0 2  Down, Depressed, Hopeless 1 0 2  PHQ - 2 Score 3 0 4  Altered sleeping 0 0 2  Tired, decreased energy 0 0 3  Change in appetite 2 0 3  Feeling bad or  failure about yourself  0 0 0  Trouble concentrating 0 0 0  Moving slowly or fidgety/restless 0 0 0  Suicidal thoughts 0 0 0  PHQ-9 Score 5 0 12  Difficult doing work/chores Not difficult at all Not difficult at all Not difficult at all    BP Readings from Last 3 Encounters:  09/07/23 124/70  05/23/23 128/86  05/18/23 (!) 143/73    Physical Exam Vitals and nursing note reviewed.  Constitutional:      General: She is not in acute distress.    Appearance: Normal appearance. She is well-developed.  HENT:     Head: Normocephalic and atraumatic.  Neck:     Vascular: No carotid bruit.  Cardiovascular:     Rate and Rhythm: Normal rate and regular rhythm.  Pulmonary:     Effort: Pulmonary effort is normal. No respiratory distress.     Breath sounds: No wheezing or rhonchi.  Musculoskeletal:     Cervical back: Normal range of motion.     Right lower leg: No edema.     Left lower leg: No edema.  Lymphadenopathy:     Cervical: No cervical adenopathy.  Skin:    General: Skin is warm and dry.     Findings: No rash.  Neurological:     General: No focal deficit present.     Mental Status: She is alert and oriented to person, place, and time.  Psychiatric:        Attention and Perception: Attention normal.        Mood and Affect: Mood normal.        Behavior: Behavior normal.        Thought Content: Thought content does not include suicidal ideation. Thought content does not include suicidal plan.        Judgment: Judgment  normal.     Wt Readings from Last 3 Encounters:  09/07/23 125 lb (56.7 kg)  05/23/23 130 lb 6.4 oz (59.1 kg)  05/18/23 129 lb (58.5 kg)    BP 124/70   Pulse (!) 57   Ht 5\' 3"  (1.6 m)   Wt 125 lb (56.7 kg)   LMP  (LMP Unknown)   SpO2 97%   BMI 22.14 kg/m   Assessment and Plan:  Problem List Items Addressed This Visit       Unprioritized   COPD (chronic obstructive pulmonary disease) (HCC) (Chronic)   Current every day smoker    She is not interested in LDCT screening      Depression with anxiety - Primary (Chronic)    Long standing mild depression and anxiety unchanged. Complicated by 2 failed marriages and stress from adult children. She has been on Prozac for 30 yrs; tried Buspar 5 mg bid without side effects or benefit Will try to ramp up to 15 mg bid.      Relevant Medications   busPIRone (BUSPAR) 7.5 MG tablet   Primary hypertension (Chronic)    Normal exam with stable BP on lisinopril and metoprolol. No concerns or side effects to current medication. Remains active with house and yard work. No change in regimen; continue low sodium diet.       Relevant Medications   lisinopril (ZESTRIL) 10 MG tablet    Return in about 6 weeks (around 10/19/2023) for anxiety (can be virtual).    Reubin Milan, MD [facility] [place]

## 2023-09-07 NOTE — Assessment & Plan Note (Signed)
Normal exam with stable BP on lisinopril and metoprolol. No concerns or side effects to current medication. Remains active with house and yard work. No change in regimen; continue low sodium diet.

## 2023-09-07 NOTE — Assessment & Plan Note (Addendum)
Long standing mild depression and anxiety unchanged. Complicated by 2 failed marriages and stress from adult children. She has been on Prozac for 30 yrs; tried Buspar 5 mg bid without side effects or benefit Will try to ramp up to 15 mg bid.

## 2023-09-07 NOTE — Patient Instructions (Signed)
Take Buspar 7.5 mg twice a day for one week then increase to 2 tabs twice a day.

## 2023-09-07 NOTE — Assessment & Plan Note (Signed)
She is not interested in LDCT screening

## 2023-09-09 ENCOUNTER — Telehealth: Payer: Self-pay | Admitting: Internal Medicine

## 2023-09-09 DIAGNOSIS — F418 Other specified anxiety disorders: Secondary | ICD-10-CM

## 2023-09-09 NOTE — Telephone Encounter (Signed)
Prescription was refilled 2 days ago.

## 2023-09-09 NOTE — Telephone Encounter (Signed)
Patient request new prescription for Buspar, please advise.

## 2023-09-09 NOTE — Telephone Encounter (Signed)
Patient called stated the script for busPIRone (BUSPAR) 7.5 MG tablet is too expensive but if provider would write the script for busPIRone (BUSPAR) 5 MG tablet it would be a lot cheaper. Please f/u with patient

## 2023-09-12 ENCOUNTER — Telehealth: Payer: Self-pay | Admitting: Internal Medicine

## 2023-09-12 ENCOUNTER — Other Ambulatory Visit: Payer: Self-pay | Admitting: Internal Medicine

## 2023-09-12 DIAGNOSIS — F418 Other specified anxiety disorders: Secondary | ICD-10-CM

## 2023-09-12 MED ORDER — BUSPIRONE HCL 5 MG PO TABS
15.0000 mg | ORAL_TABLET | Freq: Two times a day (BID) | ORAL | 0 refills | Status: DC
Start: 2023-09-12 — End: 2023-12-21

## 2023-09-12 NOTE — Telephone Encounter (Signed)
Pt called in states Buspirone 7.5mg  that DR Judithann Graves prescribed is too expensive for her at 188.00. She sates she was prevoulsy on the 5 mg, that she can afford. She wants to know if Dr Judithann Graves can put her back ok the 8 mg.

## 2023-09-12 NOTE — Telephone Encounter (Signed)
Patient informed.  Valerie Donovan 

## 2023-09-22 ENCOUNTER — Ambulatory Visit: Payer: PPO | Admitting: Nurse Practitioner

## 2023-10-10 ENCOUNTER — Other Ambulatory Visit: Payer: Self-pay | Admitting: Cardiology

## 2023-10-24 ENCOUNTER — Ambulatory Visit: Payer: PPO | Admitting: Internal Medicine

## 2023-12-05 ENCOUNTER — Other Ambulatory Visit: Payer: Self-pay | Admitting: Internal Medicine

## 2023-12-05 NOTE — Telephone Encounter (Signed)
Medication Refill -  Most Recent Primary Care Visit:  Provider: Reubin Milan  Department: [facility]  Visit Type: NEW PATIENT  Date: 09/07/2023  Medication: FLUoxetine (PROZAC) 40 MG capsule [[id number]]   lisinopril (ZESTRIL) 10 MG tablet [[id number]]   Has the patient contacted their pharmacy? Yes (Agent: If no, request that the patient contact the pharmacy for the refill. If patient does not wish to contact the pharmacy document the reason why and proceed with request.) (Agent: If yes, when and what did the pharmacy advise?)  Is this the correct pharmacy for this prescription? Yes If no, delete pharmacy and type the correct one.  This is the patient's preferred pharmacy:   [id number] - [place], [place] - [address] [place] [address] Phone: [phone] Fax: [phone]    Has the prescription been filled recently? Yes  Is the patient out of the medication? No  Has the patient been seen for an appointment in the last year OR does the patient have an upcoming appointment? Yes  Can we respond through MyChart? Yes  Agent: Please be advised that Rx refills may take up to 3 business days. We ask that you follow-up with your pharmacy.

## 2023-12-08 ENCOUNTER — Other Ambulatory Visit: Payer: Self-pay | Admitting: Internal Medicine

## 2023-12-08 ENCOUNTER — Telehealth: Payer: Self-pay | Admitting: Internal Medicine

## 2023-12-08 DIAGNOSIS — F418 Other specified anxiety disorders: Secondary | ICD-10-CM

## 2023-12-08 MED ORDER — FLUOXETINE HCL 40 MG PO CAPS
40.0000 mg | ORAL_CAPSULE | Freq: Every day | ORAL | 0 refills | Status: DC
Start: 2023-12-08 — End: 2024-02-21

## 2023-12-08 NOTE — Telephone Encounter (Signed)
 Pt wants a refill on Prozac. Pt is aware she has refills on lisinopril.  KP

## 2023-12-08 NOTE — Telephone Encounter (Signed)
 Requested medication (s) are due for refill today: No  Requested medication (s) are on the active medication list: yes    Last refill: 10/12/23  #90  2 refills  Future visit scheduled no  Notes to clinic:Historical provider. Cannot refuse non-delegated meds per protocol.  Requested Prescriptions  Pending Prescriptions Disp Refills   lisinopril  (ZESTRIL ) 10 MG tablet 90 tablet 2    Sig: Take 1 tablet (10 mg total) by mouth daily.     Cardiovascular:  ACE Inhibitors Failed - 12/08/2023  5:49 PM      Failed - Cr in normal range and within 180 days    Creatinine, Ser  Date Value Ref Range Status  05/23/2023 0.72 0.57 - 1.00 mg/dL Final         Failed - K in normal range and within 180 days    Potassium  Date Value Ref Range Status  05/23/2023 4.5 3.5 - 5.2 mmol/L Final         Passed - Patient is not pregnant      Passed - Last BP in normal range    BP Readings from Last 1 Encounters:  09/07/23 124/70         Passed - Valid encounter within last 6 months    Recent Outpatient Visits           3 months ago Depression with anxiety   [place] [facility], Leita DEL, MD   6 months ago Primary hypertension   [place] [facility] Melvin Pao, NP   8 months ago Shortness of breath   [facility] Melvin Pao, NP   9 months ago Fatigue due to depression   [place] [facility] Pearley, Hyla Givens, NP   1 year ago Depression with anxiety   [facility] Melvin Pao, NP

## 2023-12-08 NOTE — Telephone Encounter (Signed)
 Pt called in checking status of refills of, FLUoxetine (PROZAC) 40 MG capsule [[id number]]   lisinopril (ZESTRIL) 10 MG tablet [[id number]] , I let her know still being worked on and the holiday came during the request on 12/30

## 2023-12-13 DIAGNOSIS — H0012 Chalazion right lower eyelid: Secondary | ICD-10-CM | POA: Diagnosis not present

## 2023-12-19 ENCOUNTER — Other Ambulatory Visit: Payer: Self-pay | Admitting: Internal Medicine

## 2023-12-19 DIAGNOSIS — F418 Other specified anxiety disorders: Secondary | ICD-10-CM

## 2023-12-20 NOTE — Telephone Encounter (Signed)
 Requested Prescriptions  Refused Prescriptions Disp Refills   FLUoxetine  (PROZAC ) 40 MG capsule [Pharmacy Med Name: FLUOXETINE  HCL 40 MG CAPSULE] 90 capsule 0    Sig: TAKE 1 CAPSULE (40 MG TOTAL) BY MOUTH DAILY.     Psychiatry:  Antidepressants - SSRI Passed - 12/20/2023  2:39 PM      Passed - Completed PHQ-2 or PHQ-9 in the last 360 days      Passed - Valid encounter within last 6 months    Recent Outpatient Visits           3 months ago Depression with anxiety   [place] [facility], Leita DEL, MD   7 months ago Primary hypertension   [place] [facility] Melvin Pao, NP   9 months ago Shortness of breath   [place] [facility] Melvin Pao, NP   9 months ago Fatigue due to depression   [place] [facility], Hyla Givens, NP   1 year ago Depression with anxiety   [place] [facility] Melvin Pao, NP       Future Appointments             Tomorrow Justus, Leita DEL, MD [facility] at [facility], [facility]

## 2023-12-21 ENCOUNTER — Encounter: Payer: Self-pay | Admitting: Internal Medicine

## 2023-12-21 ENCOUNTER — Ambulatory Visit (INDEPENDENT_AMBULATORY_CARE_PROVIDER_SITE_OTHER): Payer: PPO | Admitting: Internal Medicine

## 2023-12-21 VITALS — BP 136/82 | HR 55 | Ht 63.0 in | Wt 124.0 lb

## 2023-12-21 DIAGNOSIS — F418 Other specified anxiety disorders: Secondary | ICD-10-CM

## 2023-12-21 DIAGNOSIS — R7303 Prediabetes: Secondary | ICD-10-CM | POA: Insufficient documentation

## 2023-12-21 DIAGNOSIS — I10 Essential (primary) hypertension: Secondary | ICD-10-CM

## 2023-12-21 DIAGNOSIS — J439 Emphysema, unspecified: Secondary | ICD-10-CM | POA: Diagnosis not present

## 2023-12-21 DIAGNOSIS — Z0000 Encounter for general adult medical examination without abnormal findings: Secondary | ICD-10-CM

## 2023-12-21 DIAGNOSIS — I2510 Atherosclerotic heart disease of native coronary artery without angina pectoris: Secondary | ICD-10-CM

## 2023-12-21 DIAGNOSIS — E782 Mixed hyperlipidemia: Secondary | ICD-10-CM | POA: Diagnosis not present

## 2023-12-21 DIAGNOSIS — Z1231 Encounter for screening mammogram for malignant neoplasm of breast: Secondary | ICD-10-CM

## 2023-12-21 DIAGNOSIS — F17200 Nicotine dependence, unspecified, uncomplicated: Secondary | ICD-10-CM

## 2023-12-21 MED ORDER — METOPROLOL SUCCINATE ER 25 MG PO TB24: 12.5000 mg | ORAL_TABLET | Freq: Every day | ORAL | 1 refills | Status: DC

## 2023-12-21 MED ORDER — ATORVASTATIN CALCIUM 40 MG PO TABS: 40.0000 mg | ORAL_TABLET | Freq: Every day | ORAL | 1 refills | Status: DC

## 2023-12-21 MED ORDER — BUSPIRONE HCL 5 MG PO TABS: 15.0000 mg | ORAL_TABLET | Freq: Two times a day (BID) | ORAL | 0 refills | Status: DC

## 2023-12-21 NOTE — Assessment & Plan Note (Signed)
 Controlled BP with normal exam. Current regimen is lisinopril and metoprolol. Will continue same medications; encourage continued reduced sodium diet.

## 2023-12-21 NOTE — Assessment & Plan Note (Addendum)
 Clinically stable on Prozac  with good response, No SI or HI reported. Buspar  increased last visit to 15 mg with excellent benefit.  It also helps that she is out of the house working 30 hours per week. No change in management at this time.

## 2023-12-21 NOTE — Assessment & Plan Note (Signed)
 She continues to smoke 1 cig per day and is not interested in quitting completely at this time.

## 2023-12-21 NOTE — Assessment & Plan Note (Signed)
 LDL is  Lab Results  Component Value Date   LDLCALC 65 05/23/2023   Current regimen is atorvastatin .  Tolerating medications well without issues.

## 2023-12-21 NOTE — Assessment & Plan Note (Signed)
 Stable without recurrent chest pain. On BB, statin and ASA

## 2023-12-21 NOTE — Progress Notes (Signed)
 Date:  12/21/2023   Name:  Valerie Donovan   DOB:  [birth year]   MRN:  [id number]   Chief Complaint: Annual Exam Valerie Donovan is a 68 y.o. female who presents today for her Complete Annual Exam. She feels well. She reports exercising at work. She reports she is sleeping fairly well. Breast complaints none.  She is now working at [employer] and she loves it.  Health Maintenance  Topic Date Due   Mammogram  08/18/2023   Medicare Annual Wellness Visit  02/02/2024   COVID-19 Vaccine (3 - 2024-25 season) 01/06/2024*   Flu Shot  03/05/2024*   Zoster (Shingles) Vaccine (1 of 2) 03/20/2024*   Pneumonia Vaccine (1 of 2 - PCV) 05/22/2024*   Screening for Lung Cancer  04/02/2024   Colon Cancer Screening  07/19/2024   DTaP/Tdap/Td vaccine (2 - Td or Tdap) 09/03/2025   DEXA scan (bone density measurement)  Completed   Hepatitis C Screening  Completed   HPV Vaccine  Aged Out  *Topic was postponed. The date shown is not the original due date.     Depression        This is a chronic problem.The problem is unchanged.  Associated symptoms include no fatigue, no appetite change and no headaches.  Past treatments include SSRIs - Selective serotonin reuptake inhibitors and other medications.  Compliance with treatment is good.  Previous treatment provided moderate relief. Hyperlipidemia This is a chronic problem. The problem is controlled. Pertinent negatives include no chest pain or shortness of breath. Current antihyperlipidemic treatment includes statins. The current treatment provides significant improvement of lipids.  Hypertension This is a chronic problem. The problem is controlled. Pertinent negatives include no chest pain, headaches, palpitations or shortness of breath. Past treatments include beta blockers and ACE inhibitors. Hypertensive end-organ damage includes CAD/MI. There is no history of kidney disease or CVA.    Review of Systems  Constitutional:  Negative for  appetite change, fatigue, fever and unexpected weight change.  HENT:  Negative for tinnitus and trouble swallowing.   Eyes:  Negative for visual disturbance.  Respiratory:  Negative for cough, chest tightness and shortness of breath.   Cardiovascular:  Negative for chest pain, palpitations and leg swelling.  Gastrointestinal:  Negative for abdominal pain.  Endocrine: Negative for polydipsia and polyuria.  Genitourinary:  Negative for dysuria and hematuria.  Musculoskeletal:  Negative for arthralgias.  Neurological:  Negative for tremors, numbness and headaches.  Psychiatric/Behavioral:  Positive for depression. Negative for dysphoric mood.      Lab Results  Component Value Date   NA 142 05/23/2023   K 4.5 05/23/2023   CO2 25 05/23/2023   GLUCOSE 82 05/23/2023   BUN 7 (L) 05/23/2023   CREATININE 0.72 05/23/2023   CALCIUM  9.6 05/23/2023   EGFR 90 05/23/2023   GFRNONAA >60 04/21/2023   Lab Results  Component Value Date   CHOL 121 05/23/2023   HDL 36 (L) 05/23/2023   LDLCALC 65 05/23/2023   TRIG 106 05/23/2023   CHOLHDL 3.4 05/23/2023   Lab Results  Component Value Date   TSH 1.620 03/10/2023   Lab Results  Component Value Date   HGBA1C 5.7 (H) 09/23/2022   Lab Results  Component Value Date   WBC 11.8 (H) 04/21/2023   HGB 12.6 04/21/2023   HCT 38.7 04/21/2023   MCV 92.4 04/21/2023   PLT 494 (H) 04/21/2023   Lab Results  Component Value Date   ALT 12 05/23/2023  AST 11 05/23/2023   ALKPHOS 133 (H) 05/23/2023   BILITOT 0.3 05/23/2023   Lab Results  Component Value Date   VD25OH 41.9 05/23/2023     Patient Active Problem List   Diagnosis Date Noted   Prediabetes 12/21/2023   COPD (chronic obstructive pulmonary disease) (HCC)    Depression with anxiety    Coronary artery disease involving native coronary artery of native heart    Current every day smoker 01/21/2021   GERD (gastroesophageal reflux disease)    History of colonic polyps    Polyp of  descending colon    Vitamin D  deficiency 12/21/2017   Vitamin B12 deficiency 12/21/2017   Primary hypertension 08/05/2014   Mixed hyperlipidemia 08/05/2014    Allergies  Allergen Reactions   Ciprofloxacin Anaphylaxis   Clarithromycin Anaphylaxis   Other Anaphylaxis    ANTIBIOTICS-PT CAN TOLERATE Z-PAC AND DOXYCYCLINE  BUT ALL OTHERS SHE CANNOT  ANTIBIOTICS    ANTIBIOTICS-PT CAN TOLERATE Z-PAC AND DOXYCYCLINE  BUT ALL OTHERS SHE CANNOT   Penicillin G Anaphylaxis    Has patient had a PCN reaction causing immediate rash, facial/tongue/throat swelling, SOB or lightheadedness with hypotension: Yes  Has patient had a PCN reaction causing severe rash involving mucus membranes or skin necrosis: No  Has patient had a PCN reaction that required hospitalization: No  Has patient had a PCN reaction occurring within the last 10 years: No  If all of the above answers are "NO", then may proceed with Cephalosporin use.  Has patient had a PCN reaction causing immediate rash, facial/tongue/throat swelling, SOB or lightheadedness with hypotension: Yes Has patient had a PCN reaction causing severe rash involving mucus membranes or skin necrosis: No Has patient had a PCN reaction that required hospitalization: No Has patient had a PCN reaction occurring within the last 10 years: No If all of the above answers are "NO", then may proceed with Cephalosporin use.   Bactrim  [Sulfamethoxazole -Trimethoprim ]    Codeine Itching    vomiting   Fluoride Preparations Hives   Trazodone  Itching    Past Surgical History:  Procedure Laterality Date   COLONOSCOPY WITH PROPOFOL  N/A 07/20/2019   Procedure: COLONOSCOPY WITH PROPOFOL ;  Surgeon: Marnee Sink, MD;  Location: [facility];  Service: Endoscopy;  Laterality: N/A;   HYSTEROSCOPY WITH D & C N/A 06/15/2018   Procedure: DILATATION AND CURETTAGE /HYSTEROSCOPY;  Surgeon: Heron Lord, MD;  Location: [facility];  Service: Gynecology;  Laterality: N/A;    LEFT HEART CATH AND CORONARY ANGIOGRAPHY N/A 09/23/2022   Procedure: LEFT HEART CATH AND CORONARY ANGIOGRAPHY;  Surgeon: Sammy Crisp, MD;  Location: [facility] INVAS[facility] CV LAB;  Service: Cardiovascular;  Laterality: N/A;   POLYPECTOMY  07/20/2019   Procedure: POLYPECTOMY;  Surgeon: Marnee Sink, MD;  Location: [facility];  Service: Endoscopy;;   TUBAL LIGATION     WISDOM TOOTH EXTRACTION      Social History   Tobacco Use   Smoking status: Every Day    Current packs/day: 0.50    Average packs/day: 0.5 packs/day for 44.0 years (22.0 ttl pk-yrs)    Types: Cigarettes    Passive exposure: Current   Smokeless tobacco: Never  Vaping Use   Vaping status: Former  Substance Use Topics   Alcohol use: Never   Drug use: Never    Comment: CBD OIL IN JUUL     Medication list has been reviewed and updated.  Current Meds  Medication Sig   Ascorbic Acid (VITA-C PO) Take by mouth daily.   aspirin   81 MG chewable tablet Chew 1 tablet (81 mg total) by mouth daily.   Cyanocobalamin  (VITAMIN B-12 PO) Take by mouth daily at 6 (six) AM.   EPINEPHrine  (EPIPEN  2-PAK) 0.3 mg/0.3 mL IJ SOAJ injection Inject 0.3 mg into the muscle as needed for anaphylaxis.   FLUoxetine  (PROZAC ) 40 MG capsule Take 1 capsule (40 mg total) by mouth daily.   fluticasone  (FLONASE ) 50 MCG/ACT nasal spray Place 2 sprays into both nostrils daily.   lisinopril  (ZESTRIL ) 10 MG tablet TAKE 1 TABLET BY MOUTH DAILY   Melatonin 10-10 MG TBCR Take by mouth.   nitroGLYCERIN  (NITROSTAT ) 0.4 MG SL tablet Place 1 tablet (0.4 mg total) under the tongue every 5 (five) minutes as needed for chest pain.   [DISCONTINUED] atorvastatin  (LIPITOR ) 40 MG tablet Take 1 tablet (40 mg total) by mouth daily.   [DISCONTINUED] busPIRone  (BUSPAR ) 5 MG tablet Take 3 tablets (15 mg total) by mouth 2 (two) times daily.   [DISCONTINUED] metoprolol  succinate (TOPROL -XL) 25 MG 24 hr tablet Take 0.5 tablets (12.5 mg total) by mouth at bedtime.    [DISCONTINUED] omeprazole  (PRILOSEC) 20 MG capsule Take 1 capsule (20 mg total) by mouth daily as needed (for acid reflux/indigestion.).       12/21/2023    8:35 AM 09/07/2023    2:06 PM 05/23/2023    8:10 AM 03/10/2023   10:04 AM  GAD 7 : Generalized Anxiety Score  Nervous, Anxious, on Edge 0 2 0 0  Control/stop worrying 0 2 0 0  Worry too much - different things 0 2 0 0  Trouble relaxing 0 2 0 0  Restless 0 1 0 0  Easily annoyed or irritable 0 2 0 0  Afraid - awful might happen 0 0 0 0  Total GAD 7 Score 0 11 0 0  Anxiety Difficulty Not difficult at all Very difficult Not difficult at all Not difficult at all       12/21/2023    8:35 AM 09/07/2023    2:06 PM 05/23/2023    8:10 AM  Depression screen PHQ 2/9  Decreased Interest 0 2 0  Down, Depressed, Hopeless 0 1 0  PHQ - 2 Score 0 3 0  Altered sleeping 1 0 0  Tired, decreased energy 0 0 0  Change in appetite 0 2 0  Feeling bad or failure about yourself  0 0 0  Trouble concentrating 0 0 0  Moving slowly or fidgety/restless 0 0 0  Suicidal thoughts 0 0 0  PHQ-9 Score 1 5 0  Difficult doing work/chores Not difficult at all Not difficult at all Not difficult at all    BP Readings from Last 3 Encounters:  12/21/23 136/82  09/07/23 124/70  05/23/23 128/86    Physical Exam Vitals and nursing note reviewed.  Constitutional:      General: She is not in acute distress.    Appearance: She is well-developed.  HENT:     Head: Normocephalic and atraumatic.     Right Ear: Tympanic membrane and ear canal normal.     Left Ear: Tympanic membrane and ear canal normal.     Nose:     Right Sinus: No maxillary sinus tenderness.     Left Sinus: No maxillary sinus tenderness.  Eyes:     General: No scleral icterus.       Right eye: No discharge.        Left eye: No discharge.     Conjunctiva/sclera: Conjunctivae normal.  Neck:  Thyroid : No thyromegaly.     Vascular: No carotid bruit.  Cardiovascular:     Rate and Rhythm:  Normal rate and regular rhythm.     Pulses: Normal pulses.     Heart sounds: Normal heart sounds.  Pulmonary:     Effort: Pulmonary effort is normal. No respiratory distress.     Breath sounds: No wheezing.  Chest:  Breasts:    Right: No mass, nipple discharge, skin change or tenderness.     Left: No mass, nipple discharge, skin change or tenderness.  Abdominal:     General: Bowel sounds are normal.     Palpations: Abdomen is soft.     Tenderness: There is no abdominal tenderness.  Musculoskeletal:     Cervical back: Normal range of motion. No erythema.     Right lower leg: No edema.     Left lower leg: No edema.  Lymphadenopathy:     Cervical: No cervical adenopathy.  Skin:    General: Skin is warm and dry.     Findings: No rash.  Neurological:     Mental Status: She is alert and oriented to person, place, and time.     Cranial Nerves: No cranial nerve deficit.     Sensory: No sensory deficit.     Deep Tendon Reflexes: Reflexes are normal and symmetric.  Psychiatric:        Attention and Perception: Attention normal.        Mood and Affect: Mood normal.     Wt Readings from Last 3 Encounters:  12/21/23 124 lb (56.2 kg)  09/07/23 125 lb (56.7 kg)  05/23/23 130 lb 6.4 oz (59.1 kg)    BP 136/82   Pulse (!) 55   Ht 5\' 3"  (1.6 m)   Wt 124 lb (56.2 kg)   LMP  (LMP Unknown)   SpO2 95%   BMI 21.97 kg/m   Assessment and Plan:  Problem List Items Addressed This Visit       Unprioritized   Primary hypertension (Chronic)   Controlled BP with normal exam. Current regimen is lisinopril  and metoprolol . Will continue same medications; encourage continued reduced sodium diet.       Relevant Medications   atorvastatin  (LIPITOR ) 40 MG tablet   metoprolol  succinate (TOPROL -XL) 25 MG 24 hr tablet   Other Relevant Orders   CBC with Differential/Platelet   Comprehensive metabolic panel   TSH   Mixed hyperlipidemia (Chronic)   LDL is  Lab Results  Component Value Date    LDLCALC 65 05/23/2023   Current regimen is atorvastatin .  Tolerating medications well without issues.       Relevant Medications   atorvastatin  (LIPITOR ) 40 MG tablet   metoprolol  succinate (TOPROL -XL) 25 MG 24 hr tablet   Other Relevant Orders   Lipid panel   COPD (chronic obstructive pulmonary disease) (HCC) (Chronic)   Depression with anxiety (Chronic)   Clinically stable on Prozac  with good response, No SI or HI reported. Buspar  increased last visit to 15 mg with excellent benefit.  It also helps that she is out of the house working 30 hours per week. No change in management at this time.       Relevant Medications   busPIRone  (BUSPAR ) 5 MG tablet   Other Relevant Orders   TSH   Coronary artery disease involving native coronary artery of native heart (Chronic)   Stable without recurrent chest pain. On BB, statin and ASA      Relevant Medications  atorvastatin  (LIPITOR ) 40 MG tablet   metoprolol  succinate (TOPROL -XL) 25 MG 24 hr tablet   Other Relevant Orders   CBC with Differential/Platelet   Comprehensive metabolic panel   Current every day smoker   She continues to smoke 1 cig per day and is not interested in quitting completely at this time.      Prediabetes   Relevant Orders   Hemoglobin A1c   Other Visit Diagnoses       Annual physical exam    -  Primary   She continues to declines all immunizations. she will schedule a Mammogram     Encounter for screening mammogram for breast cancer       schedule at [facility]   Relevant Orders   MM 3D SCREENING MAMMOGRAM BILATERAL BREAST       Return in about 6 months (around 06/19/2024) for HTN, Depression.    Sheron Dixons, MD [facility] Medicine [place]

## 2023-12-21 NOTE — Patient Instructions (Signed)
 Call [facility] to schedule your mammogram at [phone].

## 2024-01-02 ENCOUNTER — Other Ambulatory Visit
Admission: RE | Admit: 2024-01-02 | Discharge: 2024-01-02 | Disposition: A | Discharge status: Home / Self Care | Payer: PPO | Attending: Internal Medicine | Admitting: Internal Medicine

## 2024-01-02 DIAGNOSIS — R7303 Prediabetes: Secondary | ICD-10-CM | POA: Insufficient documentation

## 2024-01-02 DIAGNOSIS — F418 Other specified anxiety disorders: Secondary | ICD-10-CM | POA: Insufficient documentation

## 2024-01-02 DIAGNOSIS — I10 Essential (primary) hypertension: Secondary | ICD-10-CM | POA: Insufficient documentation

## 2024-01-02 DIAGNOSIS — I2510 Atherosclerotic heart disease of native coronary artery without angina pectoris: Secondary | ICD-10-CM | POA: Insufficient documentation

## 2024-01-02 DIAGNOSIS — E782 Mixed hyperlipidemia: Secondary | ICD-10-CM | POA: Insufficient documentation

## 2024-01-02 LAB — COMPREHENSIVE METABOLIC PANEL
ALT: 15 U/L (ref 0–44)
AST: 15 U/L (ref 15–41)
Albumin: 4 g/dL (ref 3.5–5.0)
Alkaline Phosphatase: 91 U/L (ref 38–126)
Anion gap: 8 (ref 5–15)
BUN: 17 mg/dL (ref 8–23)
CO2: 25 mmol/L (ref 22–32)
Calcium: 9.2 mg/dL (ref 8.9–10.3)
Chloride: 104 mmol/L (ref 98–111)
Creatinine, Ser: 0.96 mg/dL (ref 0.44–1.00)
GFR, Estimated: >60 mL/min (ref >60)
Glucose, Bld: 94 mg/dL (ref 70–99)
Potassium: 3.8 mmol/L (ref 3.5–5.1)
Sodium: 137 mmol/L (ref 135–145)
Total Bilirubin: 0.8 mg/dL (ref 0.0–1.2)
Total Protein: 7 g/dL (ref 6.5–8.1)

## 2024-01-02 LAB — CBC WITH DIFFERENTIAL/PLATELET
Abs Immature Granulocytes: 0.02 10*3/uL (ref 0.00–0.07)
Basophils Absolute: 0.1 10*3/uL (ref 0.0–0.1)
Basophils Relative: 1 %
Eosinophils Absolute: 0.3 10*3/uL (ref 0.0–0.5)
Eosinophils Relative: 4 %
HCT: 39.8 % (ref 36.0–46.0)
Hemoglobin: 13.5 g/dL (ref 12.0–15.0)
Immature Granulocytes: 0 %
Lymphocytes Relative: 33 %
Lymphs Abs: 2.6 10*3/uL (ref 0.7–4.0)
MCH: 31.6 pg (ref 26.0–34.0)
MCHC: 33.9 g/dL (ref 30.0–36.0)
MCV: 93.2 fL (ref 80.0–100.0)
Monocytes Absolute: 0.6 10*3/uL (ref 0.1–1.0)
Monocytes Relative: 7 %
Neutro Abs: 4.4 10*3/uL (ref 1.7–7.7)
Neutrophils Relative %: 55 %
Platelets: 267 10*3/uL (ref 150–400)
RBC: 4.27 MIL/uL (ref 3.87–5.11)
RDW: 11.8 % (ref 11.5–15.5)
WBC: 8 10*3/uL (ref 4.0–10.5)
nRBC: 0 % (ref 0.0–0.2)

## 2024-01-02 LAB — HEMOGLOBIN A1C
Hgb A1c MFr Bld: 5.8 % — ABNORMAL HIGH (ref 4.8–5.6)
Mean Plasma Glucose: 119.76 mg/dL

## 2024-01-02 LAB — LIPID PANEL
Cholesterol: 132 mg/dL (ref 0–200)
HDL: 45 mg/dL (ref >40)
LDL Cholesterol: 70 mg/dL (ref 0–99)
Total CHOL/HDL Ratio: 2.9 {ratio}
Triglycerides: 84 mg/dL (ref <150)
VLDL: 17 mg/dL (ref 0–40)

## 2024-01-02 LAB — TSH: TSH: 3.342 u[IU]/mL (ref 0.350–4.500)

## 2024-01-11 ENCOUNTER — Encounter: Payer: Self-pay | Admitting: Internal Medicine

## 2024-01-16 ENCOUNTER — Other Ambulatory Visit: Payer: Self-pay | Admitting: Internal Medicine

## 2024-01-16 DIAGNOSIS — F418 Other specified anxiety disorders: Secondary | ICD-10-CM

## 2024-01-17 NOTE — Telephone Encounter (Signed)
Requested medication (s) are due for refill today: no  Requested medication (s) are on the active medication list: yes  Last refill:  12/21/23 #180  Future visit scheduled: yes  Notes to clinic:  90 day refill requested from pharmacy   Requested Prescriptions  Pending Prescriptions Disp Refills   busPIRone (BUSPAR) 5 MG tablet [Pharmacy Med Name: BUSPIRONE HCL 5 MG TABLET] 540 tablet 1    Sig: Take 3 tablets (15 mg total) by mouth 2 (two) times daily.     Psychiatry: Anxiolytics/Hypnotics - Non-controlled Passed - 01/17/2024 10:54 AM      Passed - Valid encounter within last 12 months    Recent Outpatient Visits           3 weeks ago Annual physical exam   [facility] at [facility], Nyoka Cowden, MD   4 months ago Depression with anxiety   [facility] at [facility], Nyoka Cowden, MD   7 months ago Primary hypertension   [place] Larae Grooms, NP   10 months ago Shortness of breath   [place] Larae Grooms, NP   10 months ago Fatigue due to depression   [place], Sherran Needs, NP       Future Appointments             In 3 months Judithann Graves, Nyoka Cowden, MD [facility] at [facility], [facility]

## 2024-01-27 ENCOUNTER — Ambulatory Visit: Payer: Self-pay | Admitting: Internal Medicine

## 2024-01-27 ENCOUNTER — Encounter: Payer: Self-pay | Admitting: Internal Medicine

## 2024-01-27 ENCOUNTER — Telehealth: Payer: PPO | Admitting: Internal Medicine

## 2024-01-27 VITALS — Ht 63.0 in

## 2024-01-27 DIAGNOSIS — J0100 Acute maxillary sinusitis, unspecified: Secondary | ICD-10-CM | POA: Diagnosis not present

## 2024-01-27 MED ORDER — PREDNISONE 10 MG PO TABS
ORAL_TABLET | ORAL | 0 refills | Status: AC
Start: 2024-01-27 — End: 2024-02-02

## 2024-01-27 MED ORDER — AZITHROMYCIN 250 MG PO TABS: ORAL_TABLET | ORAL | 0 refills | Status: AC

## 2024-01-27 NOTE — Telephone Encounter (Signed)
 Noted  KP

## 2024-01-27 NOTE — Telephone Encounter (Signed)
 Copied from CRM [id number]. Topic: Clinical - Red Word Triage >> Jan 27, 2024  8:17 AM Elle L wrote: Red Word that prompted transfer to Nurse Triage: The patient called requesting an antibiotic. She believes she has a sinus infection. She has pain from the pressure in her ears, eyes, and nose. She has tried saline spray and laying on a heating pad but it isn't helping. Reason for Disposition  [1] Sinus pain (not just congestion) AND [2] fever  Answer Assessment - Initial Assessment Questions 1. LOCATION: "Where does it hurt?"      I'm having facial pain and sinus congestion.   Right side of my head is hurting so bad.   My teeth even hurt.   I'm allergic to everything antibiotic. L  Only Z Pak or Doxycycline. No sore throat 2. ONSET: "When did the sinus pain start?"  (e.g., hours, days)      My right side of nose ear and head hurts so bad.  Started for 2 weeks.   Last night it really hurt.   3. SEVERITY: "How bad is the pain?"   (Scale 1-10; mild, moderate or severe)   - MILD (1-3): doesn't interfere with normal activities    - MODERATE (4-7): interferes with normal activities (e.g., work or school) or awakens from sleep   - SEVERE (8-10): excruciating pain and patient unable to do any normal activities        Sleepless night 4. RECURRENT SYMPTOM: "Have you ever had sinus problems before?" If Yes, ask: "When was the last time?" and "What happened that time?"      No 5. NASAL CONGESTION: "Is the nose blocked?" If Yes, ask: "Can you open it or must you breathe through your mouth?"     Yes  I'm using saline spray and FLonase sprays.     Nothing is coming out when I blow 6. NASAL DISCHARGE: "Do you have discharge from your nose?" If so ask, "What color?"     No it's stopped up 7. FEVER: "Do you have a fever?" If Yes, ask: "What is it, how was it measured, and when did it start?"      No 8. OTHER SYMPTOMS: "Do you have any other symptoms?" (e.g., sore throat, cough, earache, difficulty breathing)      No coughing.   No sore throat.   Just the right side of my head hurts. 9. PREGNANCY: "Is there any chance you are pregnant?" "When was your last menstrual period?"     N/A due age  Protocols used: Sinus Pain or Congestion-A-AH  Chief Complaint: Sinus infection Symptoms: right sided head, sinus and ear pain Frequency: Last night bad painful night Pertinent Negatives: Patient denies fever Disposition: [] ED /[] Urgent Care (no appt availability in office) / [x] Appointment(In office/virtual)/ []  [place] [facility]/ [] Home Care/ [] Refused Recommended Disposition /[] [place] [facility]/ []  Follow-up with PCP Additional Notes: Appt made for today virtual with Dr. Judithann Graves

## 2024-01-27 NOTE — Progress Notes (Signed)
 Date:  01/27/2024   Name:  Valerie Donovan   DOB:  10-22-1956   MRN:  [id number]  This encounter was conducted via video encounter. This platform was deemed appropriate for the issues to be addressed.  The patient was correctly identified.  I advised that I am conducting the visit from a secure room in my office at [facility] clinic.  The patient is located at home. The limitations of this form of encounter were discussed with the patient and he/she agreed to proceed.  Some vital signs will be absent.  Chief Complaint: Sinusitis (Started 2 weeks. Worse at night. Headache, pressure in right side of face and sinuses. Patient using flonase, heating pad, tylenol, and saline rinse. Rare cough- says this is from drainage. No fever or SOB. )  Sinusitis This is a new problem. Episode onset: 3 weeks ago. There has been no fever. The pain is mild. Associated symptoms include congestion, coughing, ear pain, headaches and sinus pressure. Pertinent negatives include no diaphoresis or shortness of breath. Past treatments include saline nose sprays and nasal decongestants.    Review of Systems  Constitutional:  Negative for appetite change, diaphoresis, fatigue and fever.  HENT:  Positive for congestion, ear pain and sinus pressure. Negative for hearing loss and trouble swallowing.   Respiratory:  Positive for cough. Negative for shortness of breath.   Cardiovascular:  Negative for chest pain.  Neurological:  Positive for headaches. Negative for dizziness and light-headedness.  Psychiatric/Behavioral:  Negative for dysphoric mood and sleep disturbance. The patient is not nervous/anxious.      Lab Results  Component Value Date   NA 137 01/02/2024   K 3.8 01/02/2024   CO2 25 01/02/2024   GLUCOSE 94 01/02/2024   BUN 17 01/02/2024   CREATININE 0.96 01/02/2024   CALCIUM 9.2 01/02/2024   EGFR 90 05/23/2023   GFRNONAA >60 01/02/2024   Lab Results  Component Value Date   CHOL 132 01/02/2024   HDL  45 01/02/2024   LDLCALC 70 01/02/2024   TRIG 84 01/02/2024   CHOLHDL 2.9 01/02/2024   Lab Results  Component Value Date   TSH 3.342 01/02/2024   Lab Results  Component Value Date   HGBA1C 5.8 (H) 01/02/2024   Lab Results  Component Value Date   WBC 8.0 01/02/2024   HGB 13.5 01/02/2024   HCT 39.8 01/02/2024   MCV 93.2 01/02/2024   PLT 267 01/02/2024   Lab Results  Component Value Date   ALT 15 01/02/2024   AST 15 01/02/2024   ALKPHOS 91 01/02/2024   BILITOT 0.8 01/02/2024   Lab Results  Component Value Date   VD25OH 41.9 05/23/2023     Patient Active Problem List   Diagnosis Date Noted   Prediabetes 12/21/2023   COPD (chronic obstructive pulmonary disease) (HCC)    Depression with anxiety    Coronary artery disease involving native coronary artery of native heart    Current every day smoker 01/21/2021   GERD (gastroesophageal reflux disease)    History of colonic polyps    Polyp of descending colon    Vitamin D deficiency 12/21/2017   Vitamin B12 deficiency 12/21/2017   Primary hypertension 08/05/2014   Mixed hyperlipidemia 08/05/2014    Allergies  Allergen Reactions   Ciprofloxacin Anaphylaxis   Clarithromycin Anaphylaxis   Other Anaphylaxis    ANTIBIOTICS-PT CAN TOLERATE Z-PAC AND DOXYCYCLINE BUT ALL OTHERS SHE CANNOT  ANTIBIOTICS    ANTIBIOTICS-PT CAN TOLERATE Z-PAC AND DOXYCYCLINE BUT  ALL OTHERS SHE CANNOT   Penicillin G Anaphylaxis    Has patient had a PCN reaction causing immediate rash, facial/tongue/throat swelling, SOB or lightheadedness with hypotension: Yes  Has patient had a PCN reaction causing severe rash involving mucus membranes or skin necrosis: No  Has patient had a PCN reaction that required hospitalization: No  Has patient had a PCN reaction occurring within the last 10 years: No  If all of the above answers are "NO", then may proceed with Cephalosporin use.  Has patient had a PCN reaction causing immediate rash,  facial/tongue/throat swelling, SOB or lightheadedness with hypotension: Yes Has patient had a PCN reaction causing severe rash involving mucus membranes or skin necrosis: No Has patient had a PCN reaction that required hospitalization: No Has patient had a PCN reaction occurring within the last 10 years: No If all of the above answers are "NO", then may proceed with Cephalosporin use.   Bactrim [Sulfamethoxazole-Trimethoprim]    Codeine Itching    vomiting   Fluoride Preparations Hives   Trazodone Itching    Past Surgical History:  Procedure Laterality Date   COLONOSCOPY WITH PROPOFOL N/A 07/20/2019   Procedure: COLONOSCOPY WITH PROPOFOL;  Surgeon: Midge Minium, MD;  Location: [facility];  Service: Endoscopy;  Laterality: N/A;   HYSTEROSCOPY WITH D & C N/A 06/15/2018   Procedure: DILATATION AND CURETTAGE /HYSTEROSCOPY;  Surgeon: Natale Milch, MD;  Location: [facility];  Service: Gynecology;  Laterality: N/A;   LEFT HEART CATH AND CORONARY ANGIOGRAPHY N/A 09/23/2022   Procedure: LEFT HEART CATH AND CORONARY ANGIOGRAPHY;  Surgeon: Yvonne Kendall, MD;  Location: [facility];  Service: Cardiovascular;  Laterality: N/A;   POLYPECTOMY  07/20/2019   Procedure: POLYPECTOMY;  Surgeon: Midge Minium, MD;  Location: [facility];  Service: Endoscopy;;   TUBAL LIGATION     WISDOM TOOTH EXTRACTION      Social History   Tobacco Use   Smoking status: Every Day    Current packs/day: 0.50    Average packs/day: 0.5 packs/day for 44.0 years (22.0 ttl pk-yrs)    Types: Cigarettes    Passive exposure: Current   Smokeless tobacco: Never  Vaping Use   Vaping status: Former  Substance Use Topics   Alcohol use: Never   Drug use: Never    Comment: CBD OIL IN JUUL     Medication list has been reviewed and updated.  Current Meds  Medication Sig   Ascorbic Acid (VITA-C PO) Take by mouth daily.   aspirin 81 MG chewable tablet Chew 1 tablet (81 mg total) by mouth daily.    atorvastatin (LIPITOR) 40 MG tablet Take 1 tablet (40 mg total) by mouth daily.   azithromycin (ZITHROMAX Z-PAK) 250 MG tablet UAD   busPIRone (BUSPAR) 5 MG tablet Take 3 tablets (15 mg total) by mouth 2 (two) times daily.   Cyanocobalamin (VITAMIN B-12 PO) Take by mouth daily at 6 (six) AM.   EPINEPHrine (EPIPEN 2-PAK) 0.3 mg/0.3 mL IJ SOAJ injection Inject 0.3 mg into the muscle as needed for anaphylaxis.   FLUoxetine (PROZAC) 40 MG capsule Take 1 capsule (40 mg total) by mouth daily.   fluticasone (FLONASE) 50 MCG/ACT nasal spray Place 2 sprays into both nostrils daily.   lisinopril (ZESTRIL) 10 MG tablet TAKE 1 TABLET BY MOUTH DAILY   Melatonin 10-10 MG TBCR Take by mouth.   metoprolol succinate (TOPROL-XL) 25 MG 24 hr tablet Take 0.5 tablets (12.5 mg total) by mouth at bedtime.  nitroGLYCERIN (NITROSTAT) 0.4 MG SL tablet Place 1 tablet (0.4 mg total) under the tongue every 5 (five) minutes as needed for chest pain.   predniSONE (DELTASONE) 10 MG tablet Take 6 tablets (60 mg total) by mouth daily with breakfast for 1 day, THEN 5 tablets (50 mg total) daily with breakfast for 1 day, THEN 4 tablets (40 mg total) daily with breakfast for 1 day, THEN 3 tablets (30 mg total) daily with breakfast for 1 day, THEN 2 tablets (20 mg total) daily with breakfast for 1 day, THEN 1 tablet (10 mg total) daily with breakfast for 1 day.       01/27/2024   11:04 AM 12/21/2023    8:35 AM 09/07/2023    2:06 PM 05/23/2023    8:10 AM  GAD 7 : Generalized Anxiety Score  Nervous, Anxious, on Edge 0 0 2 0  Control/stop worrying 0 0 2 0  Worry too much - different things 0 0 2 0  Trouble relaxing 0 0 2 0  Restless 0 0 1 0  Easily annoyed or irritable 0 0 2 0  Afraid - awful might happen 0 0 0 0  Total GAD 7 Score 0 0 11 0  Anxiety Difficulty Not difficult at all Not difficult at all Very difficult Not difficult at all       01/27/2024   11:04 AM 12/21/2023    8:35 AM 09/07/2023    2:06 PM  Depression  screen PHQ 2/9  Decreased Interest 0 0 2  Down, Depressed, Hopeless 0 0 1  PHQ - 2 Score 0 0 3  Altered sleeping 0 1 0  Tired, decreased energy 0 0 0  Change in appetite 0 0 2  Feeling bad or failure about yourself  0 0 0  Trouble concentrating 0 0 0  Moving slowly or fidgety/restless 0 0 0  Suicidal thoughts 0 0 0  PHQ-9 Score 0 1 5  Difficult doing work/chores Not difficult at all Not difficult at all Not difficult at all    BP Readings from Last 3 Encounters:  12/21/23 136/82  09/07/23 124/70  05/23/23 128/86    Physical Exam Constitutional:      Appearance: Normal appearance.  HENT:     Nose:     Right Sinus: Maxillary sinus tenderness (patient endorsed pressure when she palpates this area) present.  Pulmonary:     Effort: Pulmonary effort is normal.  Neurological:     Mental Status: She is alert.  Psychiatric:        Mood and Affect: Mood normal.        Behavior: Behavior normal.     Wt Readings from Last 3 Encounters:  12/21/23 124 lb (56.2 kg)  09/07/23 125 lb (56.7 kg)  05/23/23 130 lb 6.4 oz (59.1 kg)    Ht 5\' 3"  (1.6 m)   LMP  (LMP Unknown)   BMI 21.97 kg/m   Assessment and Plan:  Problem List Items Addressed This Visit   None Visit Diagnoses       Acute maxillary sinusitis, recurrence not specified    -  Primary   Continue Flonase and saline spray Increase fluids   Relevant Medications   predniSONE (DELTASONE) 10 MG tablet   azithromycin (ZITHROMAX Z-PAK) 250 MG tablet      I spent 6 minutes on this encounter, 100% by video. No follow-ups on file.    Reubin Milan, MD [facility] [place]

## 2024-02-01 ENCOUNTER — Telehealth: Payer: Self-pay | Admitting: Internal Medicine

## 2024-02-01 NOTE — Telephone Encounter (Signed)
 Copied from CRM [id number]. Topic: Medicare AWV >> Feb 01, 2024  1:36 PM Payton Doughty wrote: Reason for CRM: Called LVM 02/01/2024 to schedule AWV. Please schedule Virtual or Telehealth visits ONLY.   Valerie Donovan; Care Guide Ambulatory Clinical Support  l [facility] Direct Dial: [phone]

## 2024-02-19 ENCOUNTER — Other Ambulatory Visit: Payer: Self-pay | Admitting: Internal Medicine

## 2024-02-19 DIAGNOSIS — F418 Other specified anxiety disorders: Secondary | ICD-10-CM

## 2024-02-21 NOTE — Telephone Encounter (Signed)
 Requested Prescriptions  Pending Prescriptions Disp Refills   FLUoxetine (PROZAC) 40 MG capsule [Pharmacy Med Name: FLUOXETINE HCL 40 MG CAPSULE] 90 capsule 0    Sig: TAKE 1 CAPSULE (40 MG TOTAL) BY MOUTH DAILY.     Psychiatry:  Antidepressants - SSRI Passed - 02/21/2024 11:23 AM      Passed - Completed PHQ-2 or PHQ-9 in the last 360 days      Passed - Valid encounter within last 6 months    Recent Outpatient Visits           2 months ago Annual physical exam   [place] [facility], Nyoka Cowden, MD   5 months ago Depression with anxiety   [place] [facility] at [facility], Nyoka Cowden, MD   9 months ago Primary hypertension   [place] [facility] Larae Grooms, NP   11 months ago Shortness of breath   [place] [facility] Larae Grooms, NP   11 months ago Fatigue due to depression   [place] [facility] Rushie Goltz, Sherran Needs, NP       Future Appointments             In 2 months Judithann Graves Nyoka Cowden, MD [facility] at [facility] [place], [facility]             busPIRone (BUSPAR) 5 MG tablet [Pharmacy Med Name: BUSPIRONE HCL 5 MG TABLET] 180 tablet 2    Sig: TAKE 3 TABLETS BY MOUTH TWICE A DAY *5MG  TABS PREFERRED PER INS/PT*     Psychiatry: Anxiolytics/Hypnotics - Non-controlled Passed - 02/21/2024 11:23 AM      Passed - Valid encounter within last 12 months    Recent Outpatient Visits           2 months ago Annual physical exam   [facility] at [facility], Nyoka Cowden, MD   5 months ago Depression with anxiety   [facility] at [facility], Nyoka Cowden, MD   9 months ago Primary hypertension   [place] [facility] Larae Grooms, NP   11 months ago Shortness of breath   [place] [facility]  Larae Grooms, NP   11 months ago Fatigue due to depression   [place] [facility], Sherran Needs, NP       Future Appointments             In 2 months Judithann Graves, Nyoka Cowden, MD [facility] at [facility], [facility]

## 2024-03-08 ENCOUNTER — Ambulatory Visit: Payer: PPO | Admitting: Emergency Medicine

## 2024-03-08 VITALS — Ht 63.0 in | Wt 124.0 lb

## 2024-03-08 DIAGNOSIS — Z0000 Encounter for general adult medical examination without abnormal findings: Secondary | ICD-10-CM

## 2024-03-08 NOTE — Progress Notes (Signed)
 Subjective:   Valerie Donovan is a 68 y.o. who presents for a Medicare Wellness preventive visit.  Visit Complete: Virtual I connected with  Valerie Donovan on 03/08/24 by a audio enabled telemedicine application and verified that I am speaking with the correct person using two identifiers.  Patient Location: Home  Provider Location: Home Office  I discussed the limitations of evaluation and management by telemedicine. The patient expressed understanding and agreed to proceed.  Vital Signs: Because this visit was a virtual/telehealth visit, some criteria may be missing or patient reported. Any vitals not documented were not able to be obtained and vitals that have been documented are patient reported.  VideoDeclined- This patient declined Librarian, academic. Therefore the visit was completed with audio only.  Persons Participating in Visit: Patient.  AWV Questionnaire: No: Patient Medicare AWV questionnaire was not completed prior to this visit.  Cardiac Risk Factors include: advanced age (>36men, >17 women);hypertension;dyslipidemia;smoking/ tobacco exposure;Other (see comment), Risk factor comments: prediabetic, CAD     Objective:    Today's Vitals   03/08/24 0935  Weight: 124 lb (56.2 kg)  Height: 5\' 3"  (1.6 m)   Body mass index is 21.97 kg/m.     03/08/2024    9:50 AM 04/21/2023    8:34 AM 04/03/2023    2:13 PM 02/23/2023    1:07 PM 02/01/2023    1:02 PM 11/11/2022   10:53 AM 09/23/2022    2:02 PM  Advanced Directives  Does Patient Have a Medical Advance Directive? No No No No No No No  Would patient like information on creating a medical advance directive? Yes (MAU/Ambulatory/Procedural Areas - Information given) No - Patient declined   No - Patient declined      Current Medications (verified) Outpatient Encounter Medications as of 03/08/2024  Medication Sig   Ascorbic Acid (VITA-C PO) Take by mouth daily.   aspirin 81 MG chewable tablet  Chew 1 tablet (81 mg total) by mouth daily.   atorvastatin (LIPITOR) 40 MG tablet Take 1 tablet (40 mg total) by mouth daily.   busPIRone (BUSPAR) 5 MG tablet TAKE 3 TABLETS BY MOUTH TWICE A DAY *5MG  TABS PREFERRED PER INS/PT*   cholecalciferol (VITAMIN D3) 25 MCG (1000 UNIT) tablet Take 1,000 Units by mouth daily.   Cyanocobalamin (VITAMIN B-12 PO) Take by mouth daily at 6 (six) AM.   EPINEPHrine (EPIPEN 2-PAK) 0.3 mg/0.3 mL IJ SOAJ injection Inject 0.3 mg into the muscle as needed for anaphylaxis.   FLUoxetine (PROZAC) 40 MG capsule TAKE 1 CAPSULE (40 MG TOTAL) BY MOUTH DAILY.   fluticasone (FLONASE) 50 MCG/ACT nasal spray Place 2 sprays into both nostrils daily.   lisinopril (ZESTRIL) 10 MG tablet TAKE 1 TABLET BY MOUTH DAILY   Melatonin 10-10 MG TBCR Take by mouth.   metoprolol succinate (TOPROL-XL) 25 MG 24 hr tablet Take 0.5 tablets (12.5 mg total) by mouth at bedtime.   nitroGLYCERIN (NITROSTAT) 0.4 MG SL tablet Place 1 tablet (0.4 mg total) under the tongue every 5 (five) minutes as needed for chest pain.   No facility-administered encounter medications on file as of 03/08/2024.    Allergies (verified) Ciprofloxacin, Clarithromycin, Other, Penicillin g, Bactrim [sulfamethoxazole-trimethoprim], Codeine, Fluoride preparations, and Trazodone   History: Past Medical History:  Diagnosis Date   Allergy    Angina pectoris (HCC)    Anxiety    Arthritis    FINGERS   Cataract    COPD (chronic obstructive pulmonary disease) (HCC)  COPD (chronic obstructive pulmonary disease) (HCC)    Depression    GERD (gastroesophageal reflux disease)    Hyperlipidemia    Hypertension    NSTEMI (non-ST elevated myocardial infarction) (HCC) 09/23/2022   Postmenopausal bleeding 2014; 10/202018; 05/14/18   endometrial bx done 09/2015 - nl; nl paps   Past Surgical History:  Procedure Laterality Date   COLONOSCOPY WITH PROPOFOL N/A 07/20/2019   Procedure: COLONOSCOPY WITH PROPOFOL;  Surgeon: Midge Minium, MD;  Location: [facility];  Service: Endoscopy;  Laterality: N/A;   HYSTEROSCOPY WITH D & C N/A 06/15/2018   Procedure: DILATATION AND CURETTAGE /HYSTEROSCOPY;  Surgeon: Natale Milch, MD;  Location: [facility];  Service: Gynecology;  Laterality: N/A;   LEFT HEART CATH AND CORONARY ANGIOGRAPHY N/A 09/23/2022   Procedure: LEFT HEART CATH AND CORONARY ANGIOGRAPHY;  Surgeon: Yvonne Kendall, MD;  Location: [facility];  Service: Cardiovascular;  Laterality: N/A;   POLYPECTOMY  07/20/2019   Procedure: POLYPECTOMY;  Surgeon: Midge Minium, MD;  Location: [facility];  Service: Endoscopy;;   TUBAL LIGATION     WISDOM TOOTH EXTRACTION     Family History  Problem Relation Age of Onset   Cancer Mother    Diabetes Mother    Early death Mother    Cancer Father        colon   Arthritis Father    Diabetes Sister    Asthma Sister    Diabetes Sister    Diabetes Maternal Grandmother    Diabetes Maternal Grandfather    Breast cancer Neg Hx    Social History   Socioeconomic History   Marital status: Divorced    Spouse name: Not on file   Number of children: 2   Years of education: Not on file   Highest education level: 12th grade  Occupational History   Occupation: retired  Tobacco Use   Smoking status: Every Day    Current packs/day: 0.50    Average packs/day: 0.5 packs/day for 49.3 years (24.6 ttl pk-yrs)    Types: Cigarettes    Start date: 1976    Passive exposure: Current   Smokeless tobacco: Never   Tobacco comments:    03/08/24 smoking 1/2 to 3/4 ppd  Vaping Use   Vaping status: Former   Quit date: 09/08/2023   Substances: Nicotine, CBD, Flavoring  Substance and Sexual Activity   Alcohol use: Never   Drug use: Not Currently    Comment: CBD OIL IN JUUL, 03/08/24 last use ~6 mths ago   Sexual activity: Not Currently    Birth control/protection: Post-menopausal, None  Other Topics Concern   Not on file  Social History Narrative   Not on  file   Social Drivers of Health   Financial Resource Strain: Low Risk  (03/08/2024)   Overall Financial Resource Strain (CARDIA)    Difficulty of Paying Living Expenses: Not very hard  Recent Concern: Financial Resource Strain - Medium Risk (12/18/2023)   Overall Financial Resource Strain (CARDIA)    Difficulty of Paying Living Expenses: Somewhat hard  Food Insecurity: No Food Insecurity (03/08/2024)   Hunger Vital Sign    Worried About Running Out of Food in the Last Year: Never true    Ran Out of Food in the Last Year: Never true  Recent Concern: Food Insecurity - Food Insecurity Present (12/18/2023)   Hunger Vital Sign    Worried About Running Out of Food in the Last Year: Sometimes true    Ran Out of  Food in the Last Year: Never true  Transportation Needs: No Transportation Needs (03/08/2024)   PRAPARE - Administrator, Civil Service (Medical): No    Lack of Transportation (Non-Medical): No  Physical Activity: Insufficiently Active (03/08/2024)   Exercise Vital Sign    Days of Exercise per Week: 5 days    Minutes of Exercise per Session: 20 min  Stress: Stress Concern Present (03/08/2024)   [employer] of Occupational Health - Occupational Stress Questionnaire    Feeling of Stress : Rather much  Social Connections: Socially Isolated (03/08/2024)   Social Connection and Isolation Panel [NHANES]    Frequency of Communication with Friends and Family: More than three times a week    Frequency of Social Gatherings with Friends and Family: More than three times a week    Attends Religious Services: Never    Database administrator or Organizations: No    Attends Banker Meetings: Never    Marital Status: Divorced    Tobacco Counseling Ready to quit: No Counseling given: Not Answered Tobacco comments: 03/08/24 smoking 1/2 to 3/4 ppd    Clinical Intake:  Pre-visit preparation completed: Yes  Pain : No/denies pain     BMI - recorded: 21.97 Nutritional  Status: BMI of 19-24  Normal Nutritional Risks: None Diabetes: No  Lab Results  Component Value Date   HGBA1C 5.8 (H) 01/02/2024   HGBA1C 5.7 (H) 09/23/2022     How often do you need to have someone help you when you read instructions, pamphlets, or other written materials from your doctor or pharmacy?: 1 - Never  Interpreter Needed?: No  Information entered by :: Tora Kindred, CMA   Activities of Daily Living     03/08/2024    9:37 AM  In your present state of health, do you have any difficulty performing the following activities:  Hearing? 0  Vision? 0  Difficulty concentrating or making decisions? 0  Walking or climbing stairs? 0  Dressing or bathing? 0  Doing errands, shopping? 0  Preparing Food and eating ? N  Using the Toilet? N  In the past six months, have you accidently leaked urine? N  Do you have problems with loss of bowel control? N  Managing your Medications? N  Managing your Finances? N  Housekeeping or managing your Housekeeping? N    Patient Care Team: Reubin Milan, MD as PCP - General (Internal Medicine) Lovenia Shuck, MD as Referring Physician (Cardiology) Dingeldein, Viviann Spare, MD (Ophthalmology) Sondra Come, MD as Consulting Physician (Urology)  Indicate any recent Medical Services you may have received from other than [facility] providers in the past year (date may be approximate).     Assessment:   This is a routine wellness examination for Valerie Donovan.  Hearing/Vision screen Hearing Screening - Comments:: Denies hearing loss Vision Screening - Comments:: Gets routine eye exams, Dr. Dellie Burns, [facility]   Goals Addressed             This Visit's Progress    Patient Stated       More exercise and less smoking       Depression Screen     03/08/2024    9:46 AM 01/27/2024   11:04 AM 12/21/2023    8:35 AM 09/07/2023    2:06 PM 05/23/2023    8:10 AM 03/10/2023   10:03 AM 02/01/2023    1:00 PM  PHQ 2/9 Scores  PHQ - 2 Score 0 0 0 3 0  4 0  PHQ- 9 Score 1 0 1 5 0 12 0    Fall Risk     03/08/2024    9:51 AM 01/27/2024   11:04 AM 12/21/2023    8:34 AM 09/07/2023    2:05 PM 05/23/2023    8:10 AM  Fall Risk   Falls in the past year? 0 0 0 0 0  Number falls in past yr: 0 0 0 0 0  Injury with Fall? 0 0 0 0 0  Risk for fall due to : No Fall Risks No Fall Risks No Fall Risks No Fall Risks No Fall Risks  Follow up Falls prevention discussed;Falls evaluation completed Falls evaluation completed Falls evaluation completed Falls evaluation completed Falls evaluation completed    MEDICARE RISK AT HOME:  Medicare Risk at Home Any stairs in or around the home?: Yes If so, are there any without handrails?: No Home free of loose throw rugs in walkways, pet beds, electrical cords, etc?: Yes Adequate lighting in your home to reduce risk of falls?: Yes Life alert?: No Use of a cane, walker or w/c?: No Grab bars in the bathroom?: Yes Shower chair or bench in shower?: No Elevated toilet seat or a handicapped toilet?: Yes  TIMED UP AND GO:  Was the test performed?  No  Cognitive Function: 6CIT completed        03/08/2024    9:52 AM 02/01/2023    1:08 PM 12/19/2020    1:55 PM  6CIT Screen  What Year? 0 points 0 points 0 points  What month? 0 points 0 points 0 points  What time? 0 points 0 points 0 points  Count back from 20 0 points 2 points 0 points  Months in reverse 0 points 0 points 0 points  Repeat phrase 0 points 0 points 0 points  Total Score 0 points 2 points 0 points    Immunizations Immunization History  Administered Date(s) Administered   PFIZER(Purple Top)SARS-COV-2 Vaccination 07/21/2020, 08/18/2020   Tdap 09/04/2015    Screening Tests Health Maintenance  Topic Date Due   DEXA SCAN  12/11/2019   COVID-19 Vaccine (3 - 2024-25 season) 08/07/2023   MAMMOGRAM  08/18/2023   Lung Cancer Screening  04/02/2024   Zoster Vaccines- Shingrix (1 of 2) 03/20/2024 (Originally 05/04/2004)   Pneumonia Vaccine 65+ Years  old (1 of 2 - PCV) 05/22/2024 (Originally 05/04/1962)   INFLUENZA VACCINE  07/06/2024   Colonoscopy  07/19/2024   Medicare Annual Wellness (AWV)  03/08/2025   DTaP/Tdap/Td (2 - Td or Tdap) 09/03/2025   Hepatitis C Screening  Completed   HPV VACCINES  Aged Out    Health Maintenance  Health Maintenance Due  Topic Date Due   DEXA SCAN  12/11/2019   COVID-19 Vaccine (3 - 2024-25 season) 08/07/2023   MAMMOGRAM  08/18/2023   Lung Cancer Screening  04/02/2024   Health Maintenance Items Addressed: See Nurse Notes  Additional Screening:  Vision Screening: Recommended annual ophthalmology exams for early detection of glaucoma and other disorders of the eye.  Dental Screening: Recommended annual dental exams for proper oral hygiene  Community Resource Referral / Chronic Care Management: CRR required this visit?  No   CCM required this visit?  No     Plan:     I have personally reviewed and noted the following in the patient's chart:   Medical and social history Use of alcohol, tobacco or illicit drugs  Current medications and supplements including opioid prescriptions. Patient is not  currently taking opioid prescriptions. Functional ability and status Nutritional status Physical activity Advanced directives List of other physicians Hospitalizations, surgeries, and ER visits in previous 12 months Vitals Screenings to include cognitive, depression, and falls Referrals and appointments  In addition, I have reviewed and discussed with patient certain preventive protocols, quality metrics, and best practice recommendations. A written personalized care plan for preventive services as well as general preventive health recommendations were provided to patient.     Tora Kindred, CMA   03/08/2024   After Visit Summary: (MyChart) Due to this being a telephonic visit, the after visit summary with patients personalized plan was offered to patient via MyChart   Notes:  Needs MMG  (order placed 12/21/23). Gave phone # to schedule Declined Covid, shingles and pneumonia vaccines Declined LDCT at this time Declined colon and DEXA scan at this time

## 2024-03-08 NOTE — Patient Instructions (Addendum)
 Valerie Donovan , Thank you for taking time to come for your Medicare Wellness Visit. I appreciate your ongoing commitment to your health goals. Please review the following plan we discussed and let me know if I can assist you in the future.   Referrals/Orders/Follow-Ups/Clinician Recommendations: Call [facility] @ [phone] to schedule your mammogram at your earliest convenience.  This is a list of the screening recommended for you and due dates:  Health Maintenance  Topic Date Due   DEXA scan (bone density measurement)  12/11/2019   COVID-19 Vaccine (3 - 2024-25 season) 08/07/2023   Mammogram  08/18/2023   Screening for Lung Cancer  04/02/2024   Zoster (Shingles) Vaccine (1 of 2) 03/20/2024*   Pneumonia Vaccine (1 of 2 - PCV) 05/22/2024*   Flu Shot  07/06/2024   Colon Cancer Screening  07/19/2024   Medicare Annual Wellness Visit  03/08/2025   DTaP/Tdap/Td vaccine (2 - Td or Tdap) 09/03/2025   Hepatitis C Screening  Completed   HPV Vaccine  Aged Out  *Topic was postponed. The date shown is not the original due date.    Advanced directives: (ACP Link)Information on Advanced Care Planning can be found at [facility] Advance Health Care Directives Advance Health Care Directives. [email or web address] You may also get these forms at your doctor's office  Once you have completed the forms, please bring a copy of your health care power of attorney and living will to the office to be added to your chart at your convenience.   Next Medicare Annual Wellness Visit scheduled for next year: Yes, 03/21/25 @ 8:40am (phone visit)

## 2024-04-12 ENCOUNTER — Other Ambulatory Visit: Payer: Self-pay | Admitting: Internal Medicine

## 2024-04-12 DIAGNOSIS — F418 Other specified anxiety disorders: Secondary | ICD-10-CM

## 2024-04-16 NOTE — Telephone Encounter (Signed)
 Requested Prescriptions  Refused Prescriptions Disp Refills   FLUoxetine  (PROZAC ) 40 MG capsule [Pharmacy Med Name: FLUOXETINE  HCL 40 MG CAPSULE] 90 capsule 0    Sig: TAKE 1 CAPSULE (40 MG TOTAL) BY MOUTH DAILY.     Psychiatry:  Antidepressants - SSRI Failed - 04/16/2024 11:16 AM      Failed - Valid encounter within last 6 months    Recent Outpatient Visits           2 months ago Acute maxillary sinusitis, recurrence not specified   [place] [facility] at [facility], Chales [place], MD       Future Appointments             In 1 week Gala Jubilee Chales [place], MD [facility] at [facility], [facility]            Passed - Completed PHQ-2 or PHQ-9 in the last 360 days

## 2024-04-22 ENCOUNTER — Other Ambulatory Visit: Payer: Self-pay | Admitting: Internal Medicine

## 2024-04-22 DIAGNOSIS — F418 Other specified anxiety disorders: Secondary | ICD-10-CM

## 2024-04-24 ENCOUNTER — Ambulatory Visit (INDEPENDENT_AMBULATORY_CARE_PROVIDER_SITE_OTHER): Payer: Self-pay | Admitting: Internal Medicine

## 2024-04-24 ENCOUNTER — Encounter: Payer: Self-pay | Admitting: Internal Medicine

## 2024-04-24 VITALS — BP 126/78 | HR 51 | Ht 63.0 in | Wt 125.2 lb

## 2024-04-24 DIAGNOSIS — F418 Other specified anxiety disorders: Secondary | ICD-10-CM | POA: Diagnosis not present

## 2024-04-24 DIAGNOSIS — F17200 Nicotine dependence, unspecified, uncomplicated: Secondary | ICD-10-CM | POA: Diagnosis not present

## 2024-04-24 DIAGNOSIS — I10 Essential (primary) hypertension: Secondary | ICD-10-CM | POA: Diagnosis not present

## 2024-04-24 DIAGNOSIS — I2510 Atherosclerotic heart disease of native coronary artery without angina pectoris: Secondary | ICD-10-CM

## 2024-04-24 MED ORDER — FLUTICASONE PROPIONATE 50 MCG/ACT NA SUSP: 2.0000 | Freq: Every day | NASAL | 6 refills | Status: AC

## 2024-04-24 MED ORDER — NITROGLYCERIN 0.4 MG SL SUBL: 0.4000 mg | SUBLINGUAL_TABLET | SUBLINGUAL | 3 refills | Status: AC | PRN

## 2024-04-24 MED ORDER — FLUOXETINE HCL 40 MG PO CAPS: 40.0000 mg | ORAL_CAPSULE | Freq: Every day | ORAL | 3 refills | Status: AC

## 2024-04-24 NOTE — Progress Notes (Signed)
 Date:  04/24/2024   Name:  Zayah Keilman Washinton   DOB:  08-Oct-1956   MRN:  [id number]   Chief Complaint: Hypertension, Depression, and Medication Refill (NITROSTATE & FLONASE )  Hypertension This is a chronic problem. The problem is controlled. Pertinent negatives include no chest pain, headaches, palpitations or shortness of breath. Past treatments include ACE inhibitors and beta blockers. The current treatment provides significant improvement. Hypertensive end-organ damage includes CVA. There is no history of angina, kidney disease or CAD/MI.  Depression        This is a chronic problem.The problem is unchanged.  Associated symptoms include no fatigue and no headaches.  Past treatments include SSRIs - Selective serotonin reuptake inhibitors and other medications.  Compliance with treatment is good.  Previous treatment provided significant relief.   Review of Systems  Constitutional:  Negative for chills, fatigue and fever.  Respiratory:  Negative for chest tightness, shortness of breath and wheezing.   Cardiovascular:  Negative for chest pain, palpitations and leg swelling.  Allergic/Immunologic: Positive for environmental allergies.  Neurological:  Negative for dizziness and headaches.  Psychiatric/Behavioral:  Positive for depression. Negative for dysphoric mood and sleep disturbance. The patient is not nervous/anxious.      Lab Results  Component Value Date   NA 137 01/02/2024   K 3.8 01/02/2024   CO2 25 01/02/2024   GLUCOSE 94 01/02/2024   BUN 17 01/02/2024   CREATININE 0.96 01/02/2024   CALCIUM  9.2 01/02/2024   EGFR 90 05/23/2023   GFRNONAA >60 01/02/2024   Lab Results  Component Value Date   CHOL 132 01/02/2024   HDL 45 01/02/2024   LDLCALC 70 01/02/2024   TRIG 84 01/02/2024   CHOLHDL 2.9 01/02/2024   Lab Results  Component Value Date   TSH 3.342 01/02/2024   Lab Results  Component Value Date   HGBA1C 5.8 (H) 01/02/2024   Lab Results  Component Value Date    WBC 8.0 01/02/2024   HGB 13.5 01/02/2024   HCT 39.8 01/02/2024   MCV 93.2 01/02/2024   PLT 267 01/02/2024   Lab Results  Component Value Date   ALT 15 01/02/2024   AST 15 01/02/2024   ALKPHOS 91 01/02/2024   BILITOT 0.8 01/02/2024   Lab Results  Component Value Date   VD25OH 41.9 05/23/2023     Patient Active Problem List   Diagnosis Date Noted   Prediabetes 12/21/2023   COPD (chronic obstructive pulmonary disease) (HCC)    Depression with anxiety    Coronary artery disease involving native coronary artery of native heart    Current every day smoker 01/21/2021   GERD (gastroesophageal reflux disease)    History of colonic polyps    Polyp of descending colon    Vitamin D  deficiency 12/21/2017   Vitamin B12 deficiency 12/21/2017   Primary hypertension 08/05/2014   Mixed hyperlipidemia 08/05/2014    Allergies  Allergen Reactions   Ciprofloxacin Anaphylaxis   Clarithromycin Anaphylaxis   Other Anaphylaxis    ANTIBIOTICS-PT CAN TOLERATE Z-PAC AND DOXYCYCLINE  BUT ALL OTHERS SHE CANNOT  ANTIBIOTICS    ANTIBIOTICS-PT CAN TOLERATE Z-PAC AND DOXYCYCLINE  BUT ALL OTHERS SHE CANNOT   Penicillin G Anaphylaxis    Has patient had a PCN reaction causing immediate rash, facial/tongue/throat swelling, SOB or lightheadedness with hypotension: Yes  Has patient had a PCN reaction causing severe rash involving mucus membranes or skin necrosis: No  Has patient had a PCN reaction that required hospitalization: No  Has patient had a  PCN reaction occurring within the last 10 years: No  If all of the above answers are "NO", then may proceed with Cephalosporin use.  Has patient had a PCN reaction causing immediate rash, facial/tongue/throat swelling, SOB or lightheadedness with hypotension: Yes Has patient had a PCN reaction causing severe rash involving mucus membranes or skin necrosis: No Has patient had a PCN reaction that required hospitalization: No Has patient had a PCN reaction  occurring within the last 10 years: No If all of the above answers are "NO", then may proceed with Cephalosporin use.   Bactrim  [Sulfamethoxazole -Trimethoprim ]    Codeine Itching    vomiting   Fluoride Preparations Hives   Trazodone  Itching    Past Surgical History:  Procedure Laterality Date   COLONOSCOPY WITH PROPOFOL  N/A 07/20/2019   Procedure: COLONOSCOPY WITH PROPOFOL ;  Surgeon: Marnee Sink, MD;  Location: [facility];  Service: Endoscopy;  Laterality: N/A;   HYSTEROSCOPY WITH D & C N/A 06/15/2018   Procedure: DILATATION AND CURETTAGE /HYSTEROSCOPY;  Surgeon: Heron Lord, MD;  Location: [facility];  Service: Gynecology;  Laterality: N/A;   LEFT HEART CATH AND CORONARY ANGIOGRAPHY N/A 09/23/2022   Procedure: LEFT HEART CATH AND CORONARY ANGIOGRAPHY;  Surgeon: Sammy Crisp, MD;  Location: [facility];  Service: Cardiovascular;  Laterality: N/A;   POLYPECTOMY  07/20/2019   Procedure: POLYPECTOMY;  Surgeon: Marnee Sink, MD;  Location: [facility];  Service: Endoscopy;;   TUBAL LIGATION     WISDOM TOOTH EXTRACTION      Social History   Tobacco Use   Smoking status: Every Day    Current packs/day: 0.50    Average packs/day: 0.5 packs/day for 49.4 years (24.7 ttl pk-yrs)    Types: Cigarettes    Start date: 1976    Passive exposure: Current   Smokeless tobacco: Never   Tobacco comments:    03/08/24 smoking 1/2 to 3/4 ppd  Vaping Use   Vaping status: Former   Quit date: 09/08/2023   Substances: Nicotine , CBD, Flavoring  Substance Use Topics   Alcohol use: Never   Drug use: Not Currently    Comment: CBD OIL IN JUUL, 03/08/24 last use ~6 mths ago     Medication list has been reviewed and updated.  Current Meds  Medication Sig   Ascorbic Acid (VITA-C PO) Take by mouth daily.   aspirin  81 MG chewable tablet Chew 1 tablet (81 mg total) by mouth daily.   atorvastatin  (LIPITOR ) 40 MG tablet Take 1 tablet (40 mg total) by mouth daily.    busPIRone  (BUSPAR ) 5 MG tablet TAKE 3 TABLETS BY MOUTH TWICE A DAY *5MG  TABS PREFERRED PER INS/PT*   cholecalciferol (VITAMIN D3) 25 MCG (1000 UNIT) tablet Take 1,000 Units by mouth daily.   Cyanocobalamin  (VITAMIN B-12 PO) Take by mouth daily at 6 (six) AM.   EPINEPHrine  (EPIPEN  2-PAK) 0.3 mg/0.3 mL IJ SOAJ injection Inject 0.3 mg into the muscle as needed for anaphylaxis.   lisinopril  (ZESTRIL ) 10 MG tablet TAKE 1 TABLET BY MOUTH DAILY   Melatonin 10-10 MG TBCR Take by mouth.   metoprolol  succinate (TOPROL -XL) 25 MG 24 hr tablet Take 0.5 tablets (12.5 mg total) by mouth at bedtime.   [DISCONTINUED] FLUoxetine  (PROZAC ) 40 MG capsule TAKE 1 CAPSULE (40 MG TOTAL) BY MOUTH DAILY.   [DISCONTINUED] fluticasone  (FLONASE ) 50 MCG/ACT nasal spray Place 2 sprays into both nostrils daily.   [DISCONTINUED] nitroGLYCERIN  (NITROSTAT ) 0.4 MG SL tablet Place 1 tablet (0.4 mg total) under the  tongue every 5 (five) minutes as needed for chest pain.       04/24/2024    8:40 AM 01/27/2024   11:04 AM 12/21/2023    8:35 AM 09/07/2023    2:06 PM  GAD 7 : Generalized Anxiety Score  Nervous, Anxious, on Edge 0 0 0 2  Control/stop worrying 0 0 0 2  Worry too much - different things 0 0 0 2  Trouble relaxing 0 0 0 2  Restless 0 0 0 1  Easily annoyed or irritable 0 0 0 2  Afraid - awful might happen 0 0 0 0  Total GAD 7 Score 0 0 0 11  Anxiety Difficulty Not difficult at all Not difficult at all Not difficult at all Very difficult       04/24/2024    8:39 AM 03/08/2024    9:46 AM 01/27/2024   11:04 AM  Depression screen PHQ 2/9  Decreased Interest 0 0 0  Down, Depressed, Hopeless 0 0 0  PHQ - 2 Score 0 0 0  Altered sleeping 2 1 0  Tired, decreased energy 0 0 0  Change in appetite 0 0 0  Feeling bad or failure about yourself  0 0 0  Trouble concentrating 0 0 0  Moving slowly or fidgety/restless 0 0 0  Suicidal thoughts 0 0 0  PHQ-9 Score 2 1 0  Difficult doing work/chores Not difficult at all Not  difficult at all Not difficult at all    BP Readings from Last 3 Encounters:  04/24/24 126/78  12/21/23 136/82  09/07/23 124/70    Physical Exam Vitals and nursing note reviewed.  Constitutional:      General: She is not in acute distress.    Appearance: Normal appearance. She is well-developed.  HENT:     Head: Normocephalic and atraumatic.  Cardiovascular:     Rate and Rhythm: Normal rate and regular rhythm.  Pulmonary:     Effort: Pulmonary effort is normal. No respiratory distress.     Breath sounds: No wheezing or rhonchi.  Musculoskeletal:     Cervical back: Normal range of motion.     Right lower leg: No edema.     Left lower leg: No edema.  Skin:    General: Skin is warm and dry.     Capillary Refill: Capillary refill takes less than 2 seconds.     Findings: No rash.  Neurological:     General: No focal deficit present.     Mental Status: She is alert and oriented to person, place, and time.  Psychiatric:        Mood and Affect: Mood normal.        Behavior: Behavior normal.     Wt Readings from Last 3 Encounters:  04/24/24 125 lb 4 oz (56.8 kg)  03/08/24 124 lb (56.2 kg)  12/21/23 124 lb (56.2 kg)    BP 126/78   Pulse (!) 51   Ht 5\' 3"  (1.6 m)   Wt 125 lb 4 oz (56.8 kg)   LMP  (LMP Unknown)   SpO2 96%   BMI 22.19 kg/m   Assessment and Plan:  Problem List Items Addressed This Visit       Unprioritized   Primary hypertension - Primary (Chronic)   Blood pressure is well controlled.  Current medications are lisinopril  and metoprolol . Will continue same regimen along with efforts to limit dietary sodium.       Relevant Medications   nitroGLYCERIN  (NITROSTAT ) 0.4  MG SL tablet   Current every day smoker   She continues to smoke 1-2 per day and is trying to stop completely. She is not interested in LDCT screening.      Depression with anxiety (Chronic)   Clinically stable on Prozac  and Buspar .   No SI or HI on evaluation. Plan to continue  same medications for now.       Relevant Medications   FLUoxetine  (PROZAC ) 40 MG capsule   Coronary artery disease involving native coronary artery of native heart (Chronic)   Stable without angina On beta blocker and statin      Relevant Medications   nitroGLYCERIN  (NITROSTAT ) 0.4 MG SL tablet    No follow-ups on file.    Sheron Dixons, MD [facility] Medicine [place]

## 2024-04-24 NOTE — Assessment & Plan Note (Signed)
 Blood pressure is well controlled.  Current medications are lisinopril and metoprolol. Will continue same regimen along with efforts to limit dietary sodium.

## 2024-04-24 NOTE — Assessment & Plan Note (Signed)
Stable without angina On beta blocker and statin

## 2024-04-24 NOTE — Assessment & Plan Note (Addendum)
 Clinically stable on Prozac  and Buspar .   No SI or HI on evaluation. Plan to continue same medications for now.

## 2024-04-24 NOTE — Assessment & Plan Note (Signed)
 She continues to smoke 1-2 per day and is trying to stop completely. She is not interested in LDCT screening.

## 2024-05-16 ENCOUNTER — Other Ambulatory Visit: Payer: Self-pay | Admitting: Internal Medicine

## 2024-05-17 NOTE — Telephone Encounter (Signed)
 Requested Prescriptions  Pending Prescriptions Disp Refills   busPIRone  (BUSPAR ) 5 MG tablet [Pharmacy Med Name: BUSPIRONE  HCL 5 MG TABLET] 540 tablet 0    Sig: TAKE 3 TABLETS BY MOUTH TWICE A DAY *5MG  TABS PREFERRED PER INS/PT*     Psychiatry: Anxiolytics/Hypnotics - Non-controlled Passed - 05/17/2024  2:26 PM      Passed - Valid encounter within last 12 months    Recent Outpatient Visits           3 weeks ago Primary hypertension   [place] [facility] at [facility] Chales [place], MD   3 months ago Acute maxillary sinusitis, recurrence not specified   [facility] at [facility], Chales [place], MD       Future Appointments             In 7 months Barnetta Liberty, MD [facility] Medicine at [facility], [facility]

## 2024-07-11 ENCOUNTER — Other Ambulatory Visit: Payer: Self-pay | Admitting: Internal Medicine

## 2024-07-11 DIAGNOSIS — I10 Essential (primary) hypertension: Secondary | ICD-10-CM

## 2024-07-12 ENCOUNTER — Other Ambulatory Visit: Payer: Self-pay

## 2024-07-12 DIAGNOSIS — I10 Essential (primary) hypertension: Secondary | ICD-10-CM

## 2024-07-12 MED ORDER — METOPROLOL SUCCINATE ER 25 MG PO TB24
12.5000 mg | ORAL_TABLET | Freq: Every day | ORAL | 0 refills | Status: DC
Start: 2024-07-12 — End: 2024-10-26

## 2024-07-12 NOTE — Telephone Encounter (Signed)
 Copied from CRM [id number]. Topic: Clinical - Prescription Issue >> Jul 12, 2024  2:38 PM Delon T wrote: Reason for CRM: metoprolol  succinate (TOPROL -XL) 25 MG 24 hr tablet- was not ready with other prescriptions- [phone]

## 2024-07-12 NOTE — Telephone Encounter (Signed)
 Medication sent in.

## 2024-08-22 ENCOUNTER — Other Ambulatory Visit: Payer: Self-pay | Admitting: Cardiology

## 2024-08-22 ENCOUNTER — Other Ambulatory Visit: Payer: Self-pay | Admitting: Internal Medicine

## 2024-08-22 DIAGNOSIS — E782 Mixed hyperlipidemia: Secondary | ICD-10-CM

## 2024-08-23 NOTE — Telephone Encounter (Signed)
 Requested Prescriptions  Pending Prescriptions Disp Refills   busPIRone  (BUSPAR ) 5 MG tablet [Pharmacy Med Name: BUSPIRONE  HCL 5 MG TABLET] 540 tablet 0    Sig: TAKE 3 TABLETS BY MOUTH TWICE A DAY *5MG  TABS PREFERRED PER INS/PT*     Psychiatry: Anxiolytics/Hypnotics - Non-controlled Passed - 08/23/2024  1:16 PM      Passed - Valid encounter within last 12 months    Recent Outpatient Visits           4 months ago Primary hypertension   [place] [facility] at [facility], Leita DEL, MD   6 months ago Acute maxillary sinusitis, recurrence not specified   [place] [facility] at [facility], Leita DEL, MD       Future Appointments             In 4 months Lemon Raisin, MD [facility] Primary Care & Sports Medicine at [facility], [phone] [place]             atorvastatin  (LIPITOR ) 40 MG tablet [Pharmacy Med Name: ATORVASTATIN  40 MG TABLET] 90 tablet 1    Sig: TAKE 1 TABLET BY MOUTH EVERY DAY     Cardiovascular:  Antilipid - Statins Failed - 08/23/2024  1:16 PM      Failed - Lipid Panel in normal range within the last 12 months    Cholesterol, Total  Date Value Ref Range Status  05/23/2023 121 100 - 199 mg/dL Final   Cholesterol  Date Value Ref Range Status  01/02/2024 132 0 - 200 mg/dL Final   LDL Chol Calc (NIH)  Date Value Ref Range Status  05/23/2023 65 0 - 99 mg/dL Final   LDL Cholesterol  Date Value Ref Range Status  01/02/2024 70 0 - 99 mg/dL Final    Comment:           Total Cholesterol/HDL:CHD Risk Coronary Heart Disease Risk Table                     Men   Women  1/2 Average Risk   3.4   3.3  Average Risk       5.0   4.4  2 X Average Risk   9.6   7.1  3 X Average Risk  23.4   11.0        Use the calculated Patient Ratio above and the CHD Risk Table to determine the patient's CHD Risk.        ATP III CLASSIFICATION (LDL):  <100     mg/dL   Optimal  899-870  mg/dL   Near or Above                     Optimal  130-159  mg/dL   Borderline  839-810  mg/dL   High  >809     mg/dL   Very High Performed at [facility], [address] [place], [place] [address]    HDL  Date Value Ref Range Status  01/02/2024 45 >40 mg/dL Final  93/82/7975 36 (L) >39 mg/dL Final   Triglycerides  Date Value Ref Range Status  01/02/2024 84 <150 mg/dL Final         Passed - Patient is not pregnant      Passed - Valid encounter within last 12 months    Recent Outpatient Visits           4 months  ago Primary hypertension   [place] [facility] at [facility], Leita DEL, MD   6 months ago Acute maxillary sinusitis, recurrence not specified   [facility] Medicine at [facility], Leita DEL, MD       Future Appointments             In 4 months Lemon Raisin, MD [facility] Medicine at [facility], [phone] [place]

## 2024-10-25 ENCOUNTER — Other Ambulatory Visit: Payer: Self-pay | Admitting: Internal Medicine

## 2024-10-25 DIAGNOSIS — I10 Essential (primary) hypertension: Secondary | ICD-10-CM

## 2024-10-26 NOTE — Telephone Encounter (Signed)
 Requested Prescriptions  Pending Prescriptions Disp Refills   metoprolol  succinate (TOPROL -XL) 25 MG 24 hr tablet [Pharmacy Med Name: METOPROLOL  SUCC ER 25 MG TAB] 45 tablet 0    Sig: TAKE 0.5 TABLETS BY MOUTH AT BEDTIME.     Cardiovascular:  Beta Blockers Failed - 10/26/2024  5:11 PM      Failed - Valid encounter within last 6 months    Recent Outpatient Visits           6 months ago Primary hypertension   [place] [facility] at [facility], Leita DEL, MD   9 months ago Acute maxillary sinusitis, recurrence not specified   [facility] Primary Care & Sports Medicine at [facility], Leita DEL, MD       Future Appointments             In 2 months Lemon Raisin, MD [facility] Primary Care & Sports Medicine at [facility], [phone] [place]            Passed - Last BP in normal range    BP Readings from Last 1 Encounters:  04/24/24 126/78         Passed - Last Heart Rate in normal range    Pulse Readings from Last 1 Encounters:  04/24/24 (!) 51

## 2024-11-14 ENCOUNTER — Other Ambulatory Visit: Payer: Self-pay | Admitting: Internal Medicine

## 2024-11-16 NOTE — Telephone Encounter (Signed)
 Requested Prescriptions  Pending Prescriptions Disp Refills   busPIRone  (BUSPAR ) 5 MG tablet [Pharmacy Med Name: BUSPIRONE  HCL 5 MG TABLET] 540 tablet 0    Sig: TAKE 3 TABLETS BY MOUTH TWICE A DAY *5MG  TABS PREFERRED PER INS/PT*     Psychiatry: Anxiolytics/Hypnotics - Non-controlled Passed - 11/16/2024 10:45 AM      Passed - Valid encounter within last 12 months    Recent Outpatient Visits           6 months ago Primary hypertension   [place] [facility] at [facility], Leita DEL, MD   9 months ago Acute maxillary sinusitis, recurrence not specified   [facility] Care & Sports Medicine at [facility], Leita DEL, MD       Future Appointments             In 1 month Lemon Raisin, MD [facility] Primary Care & Sports Medicine at [facility], [phone] [place]

## 2024-12-25 ENCOUNTER — Ambulatory Visit (INDEPENDENT_AMBULATORY_CARE_PROVIDER_SITE_OTHER): Admitting: Student

## 2024-12-25 ENCOUNTER — Encounter: Payer: Self-pay | Admitting: Student

## 2024-12-25 VITALS — BP 126/74 | HR 62 | Ht 63.0 in

## 2024-12-25 DIAGNOSIS — F17200 Nicotine dependence, unspecified, uncomplicated: Secondary | ICD-10-CM | POA: Diagnosis not present

## 2024-12-25 DIAGNOSIS — I2510 Atherosclerotic heart disease of native coronary artery without angina pectoris: Secondary | ICD-10-CM

## 2024-12-25 DIAGNOSIS — I10 Essential (primary) hypertension: Secondary | ICD-10-CM

## 2024-12-25 DIAGNOSIS — R7303 Prediabetes: Secondary | ICD-10-CM | POA: Diagnosis not present

## 2024-12-25 DIAGNOSIS — Z0000 Encounter for general adult medical examination without abnormal findings: Secondary | ICD-10-CM | POA: Diagnosis not present

## 2024-12-25 DIAGNOSIS — F418 Other specified anxiety disorders: Secondary | ICD-10-CM | POA: Diagnosis not present

## 2024-12-25 DIAGNOSIS — J439 Emphysema, unspecified: Secondary | ICD-10-CM

## 2024-12-25 DIAGNOSIS — E538 Deficiency of other specified B group vitamins: Secondary | ICD-10-CM

## 2024-12-25 NOTE — Patient Instructions (Addendum)
 Please call to schedule your mammogram and bone density at [phone]   We hope you enjoyed your visit with our office! Your feedback means so much to our team, and it helps us  to continue providing the best care possible. If you had a positive experience, we'd love if you could share it by leaving us  a [employer] Review and also completing our patient survey that you'll receive soon.  Your kind words not only brighten our day but also help other patients feel confident in choosing our office for their care.  Thank you for being a part of our practice family!

## 2024-12-25 NOTE — Assessment & Plan Note (Signed)
 Shared Decision Making: Current smoker (0.25 ppd for 50 years) with approximately a 12.5 pack year history. The patient meets USPTF recommendations for lung cancer screening with low dose CT in persons age 71 - 70 yrs with a 30 pack year history who are currently smoking or have quit in the past 15 yrs. We have discussed the risks and benefits of screening, including but not limited to the following. Lung cancer screening might detect about one half of lung cancer at an early, curative stage. For the average screened person, the risk of dying from lung cancer will decrease from 1.7% to 1.4% with screening. About 1 in 4 screened patients will have a false positive result possibly leading to more radiation exposure, cost, anxiety, and / or invasive procedures. 95% of false positives will not lead to a diagnosis of cancer. After discussion, [name] has not decided to pursue lung cancer screening.   Is not currently interesting in cessation

## 2024-12-25 NOTE — Assessment & Plan Note (Signed)
Well controlled on lisinopril and metoprolol.

## 2024-12-25 NOTE — Assessment & Plan Note (Signed)
 Repeat B12 today. Not currently on supplementation.

## 2024-12-25 NOTE — Assessment & Plan Note (Signed)
 Lab Results  Component Value Date   HGBA1C 5.8 (H) 01/02/2024   HGBA1C 5.7 (H) 09/23/2022  Diet controlled, check A1c today.

## 2024-12-25 NOTE — Assessment & Plan Note (Addendum)
 No anginal symptoms, today. Did not see cardiology last year. Will make an appointment. Doing well on metoprolol , atorvastatin , and lisinopril . LDL 70 on 12/2023. Lipid panel today

## 2024-12-25 NOTE — Progress Notes (Signed)
 "  Complete physical exam  Patient: Valerie Donovan   DOB: 1956/11/27   69 y.o. Female  MRN: [id number]  Subjective:    Chief Complaint  Patient presents with   Annual Exam    Valerie Donovan is a 69 y.o. female who presents today for a complete physical exam. She reports consuming a general diet. The patient has a physically strenuous job, but has no regular exercise apart from work.  She generally feels well. She reports sleeping fairly well. She does not have additional problems to discuss today.  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     Patient Active Problem List   Diagnosis Date Noted   Annual physical exam 12/25/2024   Prediabetes 12/21/2023   COPD (chronic obstructive pulmonary disease) (HCC)    Depression with anxiety    Coronary artery disease involving native coronary artery of native heart    Current every day smoker 01/21/2021   GERD (gastroesophageal reflux disease)    History of colonic polyps    Polyp of descending colon    Vitamin D  deficiency 12/21/2017   Vitamin B12 deficiency 12/21/2017   Primary hypertension 08/05/2014   Mixed hyperlipidemia 08/05/2014      Patient Care Team: Lemon Raisin, MD as PCP - General (Internal Medicine) Jama Margery ORN, MD as Referring Physician (Cardiology) Dingeldein, Elspeth, MD (Ophthalmology) Francisca Redell BROCKS, MD as Consulting Physician (Urology)   Show/hide medication list[1]  ROS Refer to HPI     Objective:    BP 126/74   Pulse 62   Ht 5' 3 (1.6 m)   LMP  (LMP Unknown)   SpO2 96%   BMI 22.19 kg/m  BP Readings from Last 3 Encounters:  12/25/24 126/74  04/24/24 126/78  12/21/23 136/82    Physical Exam Constitutional:      Appearance: Normal appearance.  HENT:     Head: Normocephalic and atraumatic.     Mouth/Throat:     Mouth: Mucous membranes are moist.     Pharynx: Oropharynx is clear.  Cardiovascular:     Rate and Rhythm: Normal rate and regular rhythm.     Pulses: Normal pulses.      Heart sounds: No murmur heard. Pulmonary:     Effort: Pulmonary effort is normal.     Breath sounds: No rhonchi or rales.  Abdominal:     General: Abdomen is flat. Bowel sounds are normal. There is no distension.     Palpations: Abdomen is soft.     Tenderness: There is no abdominal tenderness.  Musculoskeletal:        General: Normal range of motion.     Cervical back: Normal range of motion and neck supple.     Right lower leg: No edema.     Left lower leg: No edema.  Skin:    General: Skin is warm and dry.     Capillary Refill: Capillary refill takes less than 2 seconds.  Neurological:     General: No focal deficit present.     Mental Status: She is alert and oriented to person, place, and time.  Psychiatric:        Mood and Affect: Mood normal.        Behavior: Behavior normal.         12/25/2024   11:52 AM 04/24/2024    8:39 AM 03/08/2024    9:46 AM  Depression screen PHQ 2/9  Decreased Interest 0 0 0  Down, Depressed, Hopeless 0 0  0  PHQ - 2 Score 0 0 0  Altered sleeping 1 2 1   Tired, decreased energy 0 0 0  Change in appetite 0 0 0  Feeling bad or failure about yourself  0 0 0  Trouble concentrating 0 0 0  Moving slowly or fidgety/restless 0 0 0  Suicidal thoughts 0 0 0  PHQ-9 Score 1 2  1    Difficult doing work/chores Not difficult at all Not difficult at all Not difficult at all     Data saved with a previous flowsheet row definition      12/25/2024   11:52 AM 04/24/2024    8:40 AM 01/27/2024   11:04 AM 12/21/2023    8:35 AM  GAD 7 : Generalized Anxiety Score  Nervous, Anxious, on Edge 0 0  0  0   Control/stop worrying 0 0  0  0   Worry too much - different things 0 0  0  0   Trouble relaxing 0 0  0  0   Restless 0 0  0  0   Easily annoyed or irritable 0 0  0  0   Afraid - awful might happen 0 0  0  0   Total GAD 7 Score 0 0 0 0  Anxiety Difficulty Not difficult at all Not difficult at all Not difficult at all Not difficult at all     Data saved  with a previous flowsheet row definition    No results found for any visits on 12/25/24. Last CBC Lab Results  Component Value Date   WBC 8.0 01/02/2024   HGB 13.5 01/02/2024   HCT 39.8 01/02/2024   MCV 93.2 01/02/2024   MCH 31.6 01/02/2024   RDW 11.8 01/02/2024   PLT 267 01/02/2024   Last metabolic panel Lab Results  Component Value Date   GLUCOSE 94 01/02/2024   NA 137 01/02/2024   K 3.8 01/02/2024   CL 104 01/02/2024   CO2 25 01/02/2024   BUN 17 01/02/2024   CREATININE 0.96 01/02/2024   GFRNONAA >60 01/02/2024   CALCIUM  9.2 01/02/2024   PHOS 3.9 03/10/2023   PROT 7.0 01/02/2024   ALBUMIN 4.0 01/02/2024   LABGLOB 2.3 05/23/2023   AGRATIO 1.7 09/27/2022   BILITOT 0.8 01/02/2024   ALKPHOS 91 01/02/2024   AST 15 01/02/2024   ALT 15 01/02/2024   ANIONGAP 8 01/02/2024   Last lipids Lab Results  Component Value Date   CHOL 132 01/02/2024   HDL 45 01/02/2024   LDLCALC 70 01/02/2024   TRIG 84 01/02/2024   CHOLHDL 2.9 01/02/2024   Last hemoglobin A1c Lab Results  Component Value Date   HGBA1C 5.8 (H) 01/02/2024   Last thyroid  functions Lab Results  Component Value Date   TSH 3.342 01/02/2024   FREET4 1.05 03/10/2023        Assessment & Plan:    Routine Health Maintenance and Physical Exam  Health Maintenance  Topic Date Due   Pneumococcal Vaccine for age over 35 (1 of 2 - PCV) Never done   Zoster (Shingles) Vaccine (1 of 2) Never done   Osteoporosis screening with Bone Density Scan  12/11/2019   Breast Cancer Screening  08/18/2023   Screening for Lung Cancer  04/02/2024   Colon Cancer Screening  07/19/2024   Flu Shot  03/05/2025*   COVID-19 Vaccine (3 - 2025-26 season) 08/05/2025*   Medicare Annual Wellness Visit  03/08/2025   DTaP/Tdap/Td vaccine (2 - Td or Tdap) 09/03/2025  Hepatitis C Screening  Completed   Meningitis B Vaccine  Aged Out  *Topic was postponed. The date shown is not the original due date.    Discussed health benefits of  physical activity, and encouraged her to engage in regular exercise appropriate for her age and condition.  Annual physical exam Assessment & Plan: Labs ordered Defers colonoscopy, last colonoscopy  2020 TA of the descending colon, due in 2025, Family hx positive in father at age 52, denies constipation, change in stool caliber, weight loss, melena  Declines pneumonia and flu vaccine Risk benefit discussion regarding lung cancer, understands the risks and benefits, she does not want to pursue this Mammogram and DXA due she will call to schedule    Orders: -     TSH -     3D Screening Mammogram, Left and Right -     DG Bone Density  Primary hypertension Assessment & Plan: Well controlled on lisinopril  and metoprolol .   Orders: -     CBC with Differential/Platelet -     Comprehensive metabolic panel with GFR  Coronary artery disease involving native coronary artery of native heart without angina pectoris Assessment & Plan: No anginal symptoms, today. Did not see cardiology last year. Will make an appointment. Doing well on metoprolol , atorvastatin , and lisinopril . LDL 70 on 12/2023. Lipid panel today  Orders: -     Lipid panel  Depression with anxiety Assessment & Plan: PHQ9 and GAD 0. Stable on Buspar  15 mg twice daily and prozac  40 mg daily.    Pulmonary emphysema (HCC)  Vitamin B12 deficiency Assessment & Plan: Repeat B12 today. Not currently on supplementation.   Orders: -     Vitamin B12  Prediabetes Assessment & Plan: Lab Results  Component Value Date   HGBA1C 5.8 (H) 01/02/2024   HGBA1C 5.7 (H) 09/23/2022  Diet controlled, check A1c today.  Orders: -     Hemoglobin A1c  Chronic obstructive pulmonary disease with emphysema, unspecified emphysema type (HCC) Assessment & Plan: Denies significant cough, dyspnea, or wheezing. Remains pre contemplative regarding cessation   Current every day smoker Assessment & Plan: Shared Decision Making: Current smoker  (0.25 ppd for 50 years) with approximately a 12.5 pack year history. The patient meets USPTF recommendations for lung cancer screening with low dose CT in persons age 46 - 21 yrs with a 30 pack year history who are currently smoking or have quit in the past 15 yrs. We have discussed the risks and benefits of screening, including but not limited to the following. Lung cancer screening might detect about one half of lung cancer at an early, curative stage. For the average screened person, the risk of dying from lung cancer will decrease from 1.7% to 1.4% with screening. About 1 in 4 screened patients will have a false positive result possibly leading to more radiation exposure, cost, anxiety, and / or invasive procedures. 95% of false positives will not lead to a diagnosis of cancer. After discussion, Valerie Donovan has not decided to pursue lung cancer screening.   Is not currently interesting in cessation     Return in about 6 months (around 06/24/2025) for HTN, COPD, HLD, CAD .     Harlene Saddler, MD     [1]  Outpatient Medications Prior to Visit  Medication Sig   Ascorbic Acid (VITA-C PO) Take by mouth daily.   aspirin  81 MG chewable tablet Chew 1 tablet (81 mg total) by mouth daily.   atorvastatin  (LIPITOR ) 40 MG  tablet TAKE 1 TABLET BY MOUTH EVERY DAY   busPIRone  (BUSPAR ) 5 MG tablet TAKE 3 TABLETS BY MOUTH TWICE A DAY *5MG  TABS PREFERRED PER INS/PT*   cholecalciferol (VITAMIN D3) 25 MCG (1000 UNIT) tablet Take 1,000 Units by mouth daily.   Cyanocobalamin  (VITAMIN B-12 PO) Take by mouth daily at 6 (six) AM.   EPINEPHrine  (EPIPEN  2-PAK) 0.3 mg/0.3 mL IJ SOAJ injection Inject 0.3 mg into the muscle as needed for anaphylaxis.   FLUoxetine  (PROZAC ) 40 MG capsule Take 1 capsule (40 mg total) by mouth daily.   fluticasone  (FLONASE ) 50 MCG/ACT nasal spray Place 2 sprays into both nostrils daily.   lisinopril  (ZESTRIL ) 10 MG tablet TAKE 1 TABLET BY MOUTH EVERY DAY   Melatonin 10-10 MG TBCR  Take by mouth.   metoprolol  succinate (TOPROL -XL) 25 MG 24 hr tablet TAKE 0.5 TABLETS BY MOUTH AT BEDTIME.   nitroGLYCERIN  (NITROSTAT ) 0.4 MG SL tablet Place 1 tablet (0.4 mg total) under the tongue every 5 (five) minutes as needed for chest pain.   No facility-administered medications prior to visit.   "

## 2024-12-25 NOTE — Assessment & Plan Note (Addendum)
 Labs ordered Defers colonoscopy, last colonoscopy  2020 TA of the descending colon, due in 2025, Family hx positive in father at age 71, denies constipation, change in stool caliber, weight loss, melena  Declines pneumonia and flu vaccine Risk benefit discussion regarding lung cancer, understands the risks and benefits, she does not want to pursue this Mammogram and DXA due she will call to schedule

## 2024-12-25 NOTE — Assessment & Plan Note (Signed)
 PHQ9 and GAD 0. Stable on Buspar  15 mg twice daily and prozac  40 mg daily.

## 2024-12-25 NOTE — Assessment & Plan Note (Addendum)
 Denies significant cough, dyspnea, or wheezing. Remains pre contemplative regarding cessation

## 2024-12-26 ENCOUNTER — Ambulatory Visit: Payer: Self-pay | Admitting: Student

## 2024-12-26 LAB — COMPREHENSIVE METABOLIC PANEL WITH GFR
ALT: 12 IU/L (ref 0–32)
AST: 14 IU/L (ref 0–40)
Albumin: 4.2 g/dL (ref 3.9–4.9)
Alkaline Phosphatase: 134 IU/L (ref 49–135)
BUN/Creatinine Ratio: 11 — ABNORMAL LOW (ref 12–28)
BUN: 10 mg/dL (ref 8–27)
Bilirubin Total: 0.4 mg/dL (ref 0.0–1.2)
CO2: 21 mmol/L (ref 20–29)
Calcium: 9.5 mg/dL (ref 8.7–10.3)
Chloride: 104 mmol/L (ref 96–106)
Creatinine, Ser: 0.92 mg/dL (ref 0.57–1.00)
Globulin, Total: 2.3 g/dL (ref 1.5–4.5)
Glucose: 94 mg/dL (ref 70–99)
Potassium: 4.2 mmol/L (ref 3.5–5.2)
Sodium: 141 mmol/L (ref 134–144)
Total Protein: 6.5 g/dL (ref 6.0–8.5)
eGFR: 67 mL/min/1.73

## 2024-12-26 LAB — CBC WITH DIFFERENTIAL/PLATELET
Basophils Absolute: 0.1 x10E3/uL (ref 0.0–0.2)
Basos: 1 %
EOS (ABSOLUTE): 0.2 x10E3/uL (ref 0.0–0.4)
Eos: 2 %
Hematocrit: 43.3 % (ref 34.0–46.6)
Hemoglobin: 14.3 g/dL (ref 11.1–15.9)
Immature Grans (Abs): 0 x10E3/uL (ref 0.0–0.1)
Immature Granulocytes: 0 %
Lymphocytes Absolute: 2.4 x10E3/uL (ref 0.7–3.1)
Lymphs: 26 %
MCH: 31.8 pg (ref 26.6–33.0)
MCHC: 33 g/dL (ref 31.5–35.7)
MCV: 96 fL (ref 79–97)
Monocytes Absolute: 0.5 x10E3/uL (ref 0.1–0.9)
Monocytes: 6 %
Neutrophils Absolute: 6 x10E3/uL (ref 1.4–7.0)
Neutrophils: 65 %
Platelets: 326 x10E3/uL (ref 150–450)
RBC: 4.5 x10E6/uL (ref 3.77–5.28)
RDW: 11.7 % (ref 11.7–15.4)
WBC: 9.2 x10E3/uL (ref 3.4–10.8)

## 2024-12-26 LAB — LIPID PANEL
Chol/HDL Ratio: 3 ratio (ref 0.0–4.4)
Cholesterol, Total: 136 mg/dL (ref 100–199)
HDL: 46 mg/dL
LDL Chol Calc (NIH): 72 mg/dL (ref 0–99)
Triglycerides: 97 mg/dL (ref 0–149)
VLDL Cholesterol Cal: 18 mg/dL (ref 5–40)

## 2024-12-26 LAB — TSH: TSH: 1.72 u[IU]/mL (ref 0.450–4.500)

## 2024-12-26 LAB — VITAMIN B12: Vitamin B-12: 342 pg/mL (ref 232–1245)

## 2024-12-26 LAB — HEMOGLOBIN A1C
Est. average glucose Bld gHb Est-mCnc: 117 mg/dL
Hgb A1c MFr Bld: 5.7 % — ABNORMAL HIGH (ref 4.8–5.6)

## 2024-12-26 NOTE — Progress Notes (Signed)
 Spoke with patient and informed her of lab results. She did not have any questions and will go pick up B12 supplement.  JM

## 2025-01-29 ENCOUNTER — Encounter

## 2025-01-29 ENCOUNTER — Other Ambulatory Visit

## 2025-03-21 ENCOUNTER — Ambulatory Visit

## 2025-06-24 ENCOUNTER — Ambulatory Visit: Admitting: Student
